# Patient Record
Sex: Female | Born: 1935 | Race: White | Hispanic: No | Marital: Married | Smoking: Never smoker
Health system: Southern US, Community
[De-identification: ages and names within clinical notes are randomized; demographics above are authoritative.]

## PROBLEM LIST (undated history)

## (undated) DIAGNOSIS — I4510 Unspecified right bundle-branch block: Secondary | ICD-10-CM

## (undated) DIAGNOSIS — F419 Anxiety disorder, unspecified: Secondary | ICD-10-CM

## (undated) DIAGNOSIS — M810 Age-related osteoporosis without current pathological fracture: Secondary | ICD-10-CM

## (undated) DIAGNOSIS — F32A Depression, unspecified: Secondary | ICD-10-CM

## (undated) DIAGNOSIS — F329 Major depressive disorder, single episode, unspecified: Secondary | ICD-10-CM

## (undated) DIAGNOSIS — I5032 Chronic diastolic (congestive) heart failure: Secondary | ICD-10-CM

## (undated) DIAGNOSIS — R51 Headache: Secondary | ICD-10-CM

## (undated) DIAGNOSIS — R519 Headache, unspecified: Secondary | ICD-10-CM

## (undated) DIAGNOSIS — I10 Essential (primary) hypertension: Secondary | ICD-10-CM

## (undated) DIAGNOSIS — G4733 Obstructive sleep apnea (adult) (pediatric): Secondary | ICD-10-CM

## (undated) DIAGNOSIS — J9610 Chronic respiratory failure, unspecified whether with hypoxia or hypercapnia: Secondary | ICD-10-CM

## (undated) DIAGNOSIS — I359 Nonrheumatic aortic valve disorder, unspecified: Secondary | ICD-10-CM

## (undated) DIAGNOSIS — J984 Other disorders of lung: Secondary | ICD-10-CM

## (undated) DIAGNOSIS — F3289 Other specified depressive episodes: Secondary | ICD-10-CM

## (undated) DIAGNOSIS — K219 Gastro-esophageal reflux disease without esophagitis: Secondary | ICD-10-CM

## (undated) DIAGNOSIS — M1990 Unspecified osteoarthritis, unspecified site: Secondary | ICD-10-CM

## (undated) HISTORY — DX: Nonrheumatic aortic valve disorder, unspecified: I35.9

## (undated) HISTORY — DX: Other specified depressive episodes: F32.89

## (undated) HISTORY — DX: Chronic diastolic (congestive) heart failure: I50.32

## (undated) HISTORY — DX: Unspecified right bundle-branch block: I45.10

## (undated) HISTORY — DX: Essential (primary) hypertension: I10

## (undated) HISTORY — DX: Major depressive disorder, single episode, unspecified: F32.9

## (undated) HISTORY — DX: Obstructive sleep apnea (adult) (pediatric): G47.33

## (undated) HISTORY — DX: Age-related osteoporosis without current pathological fracture: M81.0

---
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
## 2000-04-17 ENCOUNTER — Encounter: Payer: Self-pay | Admitting: Family Medicine

## 2000-04-17 ENCOUNTER — Encounter: Admission: RE | Admit: 2000-04-17 | Discharge: 2000-04-17 | Payer: Self-pay | Admitting: Family Medicine

## 2000-04-23 ENCOUNTER — Encounter: Admission: RE | Admit: 2000-04-23 | Discharge: 2000-04-23 | Payer: Self-pay | Admitting: Family Medicine

## 2000-04-23 ENCOUNTER — Encounter: Payer: Self-pay | Admitting: Family Medicine

## 2000-07-02 ENCOUNTER — Ambulatory Visit (HOSPITAL_COMMUNITY): Admission: RE | Admit: 2000-07-02 | Discharge: 2000-07-02 | Payer: Self-pay | Admitting: *Deleted

## 2001-07-27 ENCOUNTER — Other Ambulatory Visit: Admission: RE | Admit: 2001-07-27 | Discharge: 2001-07-27 | Payer: Self-pay | Admitting: Family Medicine

## 2001-08-24 ENCOUNTER — Encounter: Admission: RE | Admit: 2001-08-24 | Discharge: 2001-08-24 | Payer: Self-pay | Admitting: Family Medicine

## 2001-08-24 ENCOUNTER — Encounter: Payer: Self-pay | Admitting: Family Medicine

## 2002-01-12 ENCOUNTER — Encounter: Payer: Self-pay | Admitting: Emergency Medicine

## 2002-01-12 ENCOUNTER — Encounter: Payer: Self-pay | Admitting: Orthopedic Surgery

## 2002-01-12 ENCOUNTER — Emergency Department (HOSPITAL_COMMUNITY): Admission: EM | Admit: 2002-01-12 | Discharge: 2002-01-12 | Payer: Self-pay | Admitting: Emergency Medicine

## 2002-01-18 ENCOUNTER — Emergency Department (HOSPITAL_COMMUNITY): Admission: EM | Admit: 2002-01-18 | Discharge: 2002-01-18 | Payer: Self-pay

## 2002-04-01 ENCOUNTER — Emergency Department (HOSPITAL_COMMUNITY): Admission: EM | Admit: 2002-04-01 | Discharge: 2002-04-01 | Payer: Self-pay | Admitting: Emergency Medicine

## 2002-04-01 ENCOUNTER — Encounter: Payer: Self-pay | Admitting: Emergency Medicine

## 2008-06-14 ENCOUNTER — Inpatient Hospital Stay (HOSPITAL_COMMUNITY): Admission: EM | Admit: 2008-06-14 | Discharge: 2008-06-17 | Payer: Self-pay | Admitting: Emergency Medicine

## 2008-06-15 ENCOUNTER — Encounter (INDEPENDENT_AMBULATORY_CARE_PROVIDER_SITE_OTHER): Payer: Self-pay | Admitting: Internal Medicine

## 2008-06-29 ENCOUNTER — Ambulatory Visit: Payer: Self-pay | Admitting: Cardiology

## 2008-06-29 LAB — CONVERTED CEMR LAB
CO2: 31 meq/L (ref 19–32)
Calcium: 9.4 mg/dL (ref 8.4–10.5)
Creatinine, Ser: 1 mg/dL (ref 0.4–1.2)
GFR calc Af Amer: 70 mL/min
Glucose, Bld: 109 mg/dL — ABNORMAL HIGH (ref 70–99)
Sodium: 135 meq/L (ref 135–145)

## 2008-07-11 ENCOUNTER — Ambulatory Visit: Payer: Self-pay | Admitting: Cardiology

## 2008-07-11 LAB — CONVERTED CEMR LAB
BUN: 34 mg/dL — ABNORMAL HIGH (ref 6–23)
Calcium: 9.4 mg/dL (ref 8.4–10.5)
GFR calc Af Amer: 48 mL/min
GFR calc non Af Amer: 39 mL/min
Glucose, Bld: 115 mg/dL — ABNORMAL HIGH (ref 70–99)
Sodium: 140 meq/L (ref 135–145)

## 2008-07-28 ENCOUNTER — Ambulatory Visit: Payer: Self-pay | Admitting: Cardiology

## 2008-07-28 LAB — CONVERTED CEMR LAB
BUN: 18 mg/dL (ref 6–23)
Creatinine, Ser: 1.3 mg/dL — ABNORMAL HIGH (ref 0.4–1.2)
GFR calc Af Amer: 52 mL/min
GFR calc non Af Amer: 43 mL/min

## 2008-08-15 DIAGNOSIS — I10 Essential (primary) hypertension: Secondary | ICD-10-CM | POA: Insufficient documentation

## 2008-08-16 ENCOUNTER — Ambulatory Visit: Payer: Self-pay | Admitting: Pulmonary Disease

## 2008-08-16 DIAGNOSIS — G4733 Obstructive sleep apnea (adult) (pediatric): Secondary | ICD-10-CM

## 2008-08-16 DIAGNOSIS — R011 Cardiac murmur, unspecified: Secondary | ICD-10-CM | POA: Insufficient documentation

## 2008-08-16 DIAGNOSIS — F329 Major depressive disorder, single episode, unspecified: Secondary | ICD-10-CM

## 2008-08-21 ENCOUNTER — Telehealth: Payer: Self-pay | Admitting: Pulmonary Disease

## 2008-08-24 ENCOUNTER — Encounter: Payer: Self-pay | Admitting: Pulmonary Disease

## 2008-09-01 ENCOUNTER — Telehealth (INDEPENDENT_AMBULATORY_CARE_PROVIDER_SITE_OTHER): Payer: Self-pay | Admitting: *Deleted

## 2008-09-13 ENCOUNTER — Ambulatory Visit: Payer: Self-pay | Admitting: Cardiology

## 2009-04-21 DIAGNOSIS — M810 Age-related osteoporosis without current pathological fracture: Secondary | ICD-10-CM | POA: Insufficient documentation

## 2009-04-21 DIAGNOSIS — I5032 Chronic diastolic (congestive) heart failure: Secondary | ICD-10-CM | POA: Insufficient documentation

## 2009-04-21 DIAGNOSIS — I359 Nonrheumatic aortic valve disorder, unspecified: Secondary | ICD-10-CM | POA: Insufficient documentation

## 2009-05-02 ENCOUNTER — Ambulatory Visit: Payer: Self-pay | Admitting: Cardiology

## 2009-05-03 ENCOUNTER — Telehealth: Payer: Self-pay | Admitting: Cardiology

## 2009-05-21 ENCOUNTER — Telehealth: Payer: Self-pay | Admitting: Cardiology

## 2009-06-20 ENCOUNTER — Telehealth: Payer: Self-pay | Admitting: Cardiology

## 2009-08-07 ENCOUNTER — Encounter: Payer: Self-pay | Admitting: Cardiology

## 2009-08-15 ENCOUNTER — Encounter (INDEPENDENT_AMBULATORY_CARE_PROVIDER_SITE_OTHER): Payer: Self-pay | Admitting: *Deleted

## 2009-11-12 ENCOUNTER — Ambulatory Visit: Payer: Self-pay | Admitting: Cardiology

## 2010-01-01 ENCOUNTER — Ambulatory Visit: Payer: Self-pay | Admitting: Cardiology

## 2010-01-01 LAB — CONVERTED CEMR LAB
GFR calc non Af Amer: 74.75 mL/min (ref >60)
Potassium: 4.4 meq/L (ref 3.5–5.1)
Sodium: 141 meq/L (ref 135–145)

## 2010-02-12 ENCOUNTER — Ambulatory Visit: Payer: Self-pay | Admitting: Cardiology

## 2010-04-26 ENCOUNTER — Ambulatory Visit: Payer: Self-pay | Admitting: Cardiology

## 2010-04-26 DIAGNOSIS — I4510 Unspecified right bundle-branch block: Secondary | ICD-10-CM

## 2010-08-02 ENCOUNTER — Telehealth: Payer: Self-pay | Admitting: Cardiology

## 2010-08-09 ENCOUNTER — Encounter: Payer: Self-pay | Admitting: Cardiology

## 2010-11-14 ENCOUNTER — Ambulatory Visit: Payer: Self-pay | Admitting: Cardiology

## 2010-12-15 ENCOUNTER — Encounter: Payer: Self-pay | Admitting: Cardiology

## 2010-12-22 LAB — CONVERTED CEMR LAB
Calcium: 9.2 mg/dL (ref 8.4–10.5)
Creatinine, Ser: 0.8 mg/dL (ref 0.4–1.2)
GFR calc non Af Amer: 74.89 mL/min (ref >60)

## 2010-12-26 NOTE — Progress Notes (Signed)
Summary: [facility] request call  Phone Note Other Incoming Call back at [phone] ext [phone]   Caller: [facility]/ [facility] Summary of Call: [facility] request call Initial call taken by: Judie Grieve,  August 02, 2010 4:59 PM  Follow-up for Phone Call        [facility] with [facility] wanted to know had Dr. Daleen Squibb seen patient recently.  He had on 6/11.   Mylo Red RN

## 2010-12-26 NOTE — Assessment & Plan Note (Signed)
Summary: 6-8 week f/u sl   Visit Type:  6-8 wk f/u Referring Provider:  Valera Castle Primary Provider:  Dr. Wynelle Link   History of Present Illness: Lori [name] returns today for evaluation management of her chronic diastolic heart failure, hypertension, history of hypertension, and lower extremity edema.  She's been having some chest tightness at rest it does not radiate. There is no nausea vomiting or diaphoresis. She has not had it with exertion.  She denies orthopnea, PND or increasing peripheral edema.  She brings in a list of blood pressure readings and heart rate readings by [facility]. Her blood pressures are well controlled most that time with rare exception. Her diastolics were always good. Heart rates running in the 85-90 range. Her weight has been stable. She fell again 0.8 pounds in the last month and a half. Our scales confirmed.  She's been under increased stress the last several months. Her husband had complications with bypass surgery in his back in the hospital. She will also sleeping pill without deferred this to her primary care physician.  Current Medications (verified): 1)  Oxygen .Marland Kitchen.. 3 L Daily 2)  Bayer Aspirin 325 Mg Tabs (Aspirin) .... Take 1 Tablet By Mouth Once A Day 3)  Amlodipine Besylate 10 Mg Tabs (Amlodipine Besylate) .Marland Kitchen.. 1 Tab Once Daily 4)  Nortriptyline Hcl 50 Mg Caps (Nortriptyline Hcl) .... Once Daily 5)  Vitamin C 1000 Mg Tabs (Ascorbic Acid) .... Take 1 Tablet By Mouth Once A Day 6)  Lorazepam 1 Mg Tabs (Lorazepam) .... Take 1 Tablet By Mouth Three Times A Day As Needed 7)  Klor-Con M20 20 Meq Cr-Tabs (Potassium Chloride Crys Cr) .... Two Times A Day 8)  Metoprolol Tartrate 100 Mg Tabs (Metoprolol Tartrate) .Marland Kitchen.. 1 Tab Two Times A Day 9)  Lisinopril 20 Mg Tabs (Lisinopril) .... 2 Tabs Qam 10)  Furosemide 40 Mg Tabs (Furosemide) .Marland Kitchen.. 1 Once Daily 11)  Hydrocodone-Acetaminophen 5-325 Mg Tabs (Hydrocodone-Acetaminophen) .... As Needed 12)  Excedrin  Extra Strength 250-250-65 Mg Tabs (Aspirin-Acetaminophen-Caffeine) .... As Needed 13)  Vitamin D3 1000 Unit Caps (Cholecalciferol) .Marland Kitchen.. 1 Cap Once Daily 14)  Potassium Chloride Crys Cr 20 Meq Cr-Tabs (Potassium Chloride Crys Cr) .Marland Kitchen.. 1 Tab Once Daily  Allergies (verified): No Known Drug Allergies  Review of Systems       negative other than history of present illness  Vital Signs:  Patient profile:   76 year old female Height:      61 inches Weight:      168 pounds BMI:     31.86 Pulse rate:   72 / minute Pulse rhythm:   regular BP sitting:   150 / 80  (left arm) Cuff size:   large  Vitals Entered By: Danielle Rankin, CMA (February 12, 2010 11:04 AM)  Physical Exam  General:  obese.  appears very tired Head:  normocephalic and atraumatic Eyes:  PERRLA/EOM intact; conjunctiva and lids normal. Neck:  Neck supple, no JVD. No masses, thyromegaly or abnormal cervical nodes. Lungs:  no rales or rhonchi Heart:  regular rate and rhythm, no gallop, soft systolic murmur at the apex, soft diastolic murmur left upper sternal border. Stable Msk:  decreased ROM.   Pulses:  pulses normal in all 4 extremities Extremities:  1+ left pedal edema and 1+ right pedal edema.   Neurologic:  Alert and oriented x 3. Skin:  Intact without lesions or rashes. Psych:  depressed affect.     Problems:  Medical Problems Added: 1)  Dx of Chest Tightness-pressure-other  [phone])  Impression & Recommendations:  Problem # 1:  DIASTOLIC HEART FAILURE, CHRONIC (ICD-428.32) Assessment Unchanged  Her updated medication list for this problem includes:    Bayer Aspirin 325 Mg Tabs (Aspirin) .Marland Kitchen... Take 1 tablet by mouth once a day    Amlodipine Besylate 10 Mg Tabs (Amlodipine besylate) .Marland Kitchen... 1 tab once daily    Metoprolol Tartrate 100 Mg Tabs (Metoprolol tartrate) .Marland Kitchen... 1 tab two times a day    Lisinopril 20 Mg Tabs (Lisinopril) .Marland Kitchen... 2 tabs qam    Furosemide 40 Mg Tabs (Furosemide) .Marland Kitchen... 1 once  daily  Problem # 2:  AORTIC INSUFFICIENCY (ICD-424.1) Assessment: Unchanged  Her updated medication list for this problem includes:    Metoprolol Tartrate 100 Mg Tabs (Metoprolol tartrate) .Marland Kitchen... 1 tab two times a day    Lisinopril 20 Mg Tabs (Lisinopril) .Marland Kitchen... 2 tabs qam    Furosemide 40 Mg Tabs (Furosemide) .Marland Kitchen... 1 once daily  Her updated medication list for this problem includes:    Metoprolol Tartrate 100 Mg Tabs (Metoprolol tartrate) .Marland Kitchen... 1 tab two times a day    Lisinopril 20 Mg Tabs (Lisinopril) .Marland Kitchen... 2 tabs qam    Furosemide 40 Mg Tabs (Furosemide) .Marland Kitchen... 1 once daily  Problem # 3:  CARDIAC MURMUR (ICD-785.2) Assessment: Unchanged  Her updated medication list for this problem includes:    Metoprolol Tartrate 100 Mg Tabs (Metoprolol tartrate) .Marland Kitchen... 1 tab two times a day    Lisinopril 20 Mg Tabs (Lisinopril) .Marland Kitchen... 2 tabs qam    Furosemide 40 Mg Tabs (Furosemide) .Marland Kitchen... 1 once daily  Her updated medication list for this problem includes:    Metoprolol Tartrate 100 Mg Tabs (Metoprolol tartrate) .Marland Kitchen... 1 tab two times a day    Lisinopril 20 Mg Tabs (Lisinopril) .Marland Kitchen... 2 tabs qam    Furosemide 40 Mg Tabs (Furosemide) .Marland Kitchen... 1 once daily  Problem # 4:  HYPERTENSION (ICD-401.9) Assessment: Improved  Her updated medication list for this problem includes:    Bayer Aspirin 325 Mg Tabs (Aspirin) .Marland Kitchen... Take 1 tablet by mouth once a day    Amlodipine Besylate 10 Mg Tabs (Amlodipine besylate) .Marland Kitchen... 1 tab once daily    Metoprolol Tartrate 100 Mg Tabs (Metoprolol tartrate) .Marland Kitchen... 1 tab two times a day    Lisinopril 20 Mg Tabs (Lisinopril) .Marland Kitchen... 2 tabs qam    Furosemide 40 Mg Tabs (Furosemide) .Marland Kitchen... 1 once daily  Her updated medication list for this problem includes:    Bayer Aspirin 325 Mg Tabs (Aspirin) .Marland Kitchen... Take 1 tablet by mouth once a day    Amlodipine Besylate 10 Mg Tabs (Amlodipine besylate) .Marland Kitchen... 1 tab once daily    Metoprolol Tartrate 100 Mg Tabs (Metoprolol tartrate) .Marland Kitchen... 1 tab  two times a day    Lisinopril 20 Mg Tabs (Lisinopril) .Marland Kitchen... 2 tabs qam    Furosemide 40 Mg Tabs (Furosemide) .Marland Kitchen... 1 once daily  Problem # 5:  CHEST TIGHTNESS-PRESSURE-OTHER ([id number]) Assessment: New She is having some chest tightness though is not classic for angina or ischemia. She had normal coronary arteries by catheterization in 2001. I've given her submental nitroglycerin to take p.r.n. and instructions for how to call 911 if she thinks she is having an acute coronary syndrome. We'll not perform any diagnostic studies at this time. I will see her back in 3 months.  Patient Instructions: 1)  Your physician recommends that you schedule a follow-up appointment in: 3 MONTHS WITH DR Gabriella Woodhead 2)  Your  physician recommends that you continue on your current medications as directed. Please refer to the Current Medication list given to you today. 3)  Your physician recommended you take 1 tablet (or 1 spray) under tongue at onset of chest pain; you may repeat every 5 minutes for up to 3 doses. If 3 or more doses are required, call 911 and proceed to the ER immediately.

## 2010-12-26 NOTE — Assessment & Plan Note (Signed)
Summary: per check out/sf   Visit Type:  rov Referring Provider:  Valera Castle Primary Provider:  Dr. Wynelle Link  CC:  chest discomfort and sob over the last couple of days but says due to she has been moving some things....edema/ankle..mostly left.  History of Present Illness: Lori [name] comes in today for evaluation management of her history of chronic diastolic congestive heart failure, hypertension, and lower extremity edema.  Overall, she is doing well. She thinks her lower extremity swelling may be a little worse particularly in the left leg. They're markedly improved from our initial evaluation.  She denies any orthopnea. She wears oxygen at night but her husband says she's not wearing it all the time or on a consistent basis. She denies any sleep apnea. She's had a formal evaluation in the past.  She denies any chest pain. She does note some heavy heartbeats on occasion but no syncope.  Current Medications (verified): 1)  Oxygen .Marland Kitchen.. 3 L Daily 2)  Bayer Aspirin 325 Mg Tabs (Aspirin) .... Take 1 Tablet By Mouth Once A Day 3)  Amlodipine Besylate 10 Mg Tabs (Amlodipine Besylate) .Marland Kitchen.. 1 Tab Once Daily 4)  Nortriptyline Hcl 50 Mg Caps (Nortriptyline Hcl) .... Once Daily 5)  Vitamin C 1000 Mg Tabs (Ascorbic Acid) .... Take 1 Tablet By Mouth Once A Day 6)  Lorazepam 1 Mg Tabs (Lorazepam) .... Take 1 Tablet By Mouth Three Times A Day As Needed 7)  Klor-Con M20 20 Meq Cr-Tabs (Potassium Chloride Crys Cr) .... Two Times A Day 8)  Metoprolol Tartrate 100 Mg Tabs (Metoprolol Tartrate) .Marland Kitchen.. 1 Tab Two Times A Day 9)  Lisinopril 20 Mg Tabs (Lisinopril) .... 2 Tabs Qam 10)  Furosemide 40 Mg Tabs (Furosemide) .Marland Kitchen.. 1 Once Daily 11)  Hydrocodone-Acetaminophen 5-325 Mg Tabs (Hydrocodone-Acetaminophen) .... As Needed 12)  Excedrin Extra Strength 250-250-65 Mg Tabs (Aspirin-Acetaminophen-Caffeine) .... As Needed 13)  Vitamin D3 1000 Unit Caps (Cholecalciferol) .Marland Kitchen.. 1 Cap Once Daily 14)  Potassium  Chloride Crys Cr 20 Meq Cr-Tabs (Potassium Chloride Crys Cr) .Marland Kitchen.. 1 Tab Two Times A Day 15)  Nitrostat 0.4 Mg Subl (Nitroglycerin) .... As Needed For Chest Pain 16)  Nucynta 75 Mg Tabs (Tapentadol Hcl) .Marland Kitchen.. 1 Tab Two Times A Day  Allergies (verified): No Known Drug Allergies  Past History:  Past Medical History: Last updated: 04/21/2009 AORTIC INSUFFICIENCY (ICD-424.1) DIASTOLIC HEART FAILURE, CHRONIC (ICD-428.32) CORONARY HEART DISEASE (ICD-414.00) CARDIAC MURMUR (ICD-785.2) HYPERTENSION (ICD-401.9) OBSTRUCTIVE SLEEP APNEA (ICD-327.23) DEPRESSION (ICD-311) OSTEOPOROSIS (ICD-733.00)    Past Surgical History: Last updated: 04/21/2009 Rhinoplasty  Family History: Last updated: 04/21/2009 clotting disorders: sisters heart disease: sister (m.i.) 2 brothers (m.i.) children (heart murmurs) cancer: father (lung) mother: TB  Social History: Last updated: 08/16/2008 pt is married. pt has children. Patient never smoked.   Risk Factors: Smoking Status: never (08/16/2008)  Review of Systems       negative history of present illness  Vital Signs:  Patient profile:   76 year old female Height:      61 inches Weight:      168 pounds BMI:     31.86 O2 Sat:      96 % on Room air Pulse rate:   92 / minute Pulse rhythm:   irregular BP sitting:   110 / 60  (left arm) Cuff size:   large  Vitals Entered By: Danielle Rankin, CMA (April 26, 2010 11:20 AM)  O2 Flow:  Room air  Physical Exam  General:  obese.  Head:  normocephalic and atraumatic Eyes:  PERRLA/EOM intact; conjunctiva and lids normal. Neck:  Neck supple, no JVD. No masses, thyromegaly or abnormal cervical nodes. Lungs:  Clear bilaterally to auscultation and percussion. Heart:  regular rate and rhythm, normal S1-S2, mild diastolic murmur along left sternal border, no gallop Msk:  decreased ROM.   Pulses:  pulses normal in all 4 extremities Extremities:  1+ left pedal edema and trace right pedal edema.     Neurologic:  Alert and oriented x 3. Skin:  Intact without lesions or rashes. Psych:  Normal affect.   Problems:  Medical Problems Added: 1)  Dx of Rbbb  (ICD-426.4)  EKG  Procedure date:  04/26/2010  Findings:      nsinus rhythm, left atrial enlargement, right bundle branch block  Impression & Recommendations:  Problem # 1:  DIASTOLIC HEART FAILURE, CHRONIC (ICD-428.32) Assessment Unchanged  Her updated medication list for this problem includes:    Bayer Aspirin 325 Mg Tabs (Aspirin) .Marland Kitchen... Take 1 tablet by mouth once a day    Amlodipine Besylate 10 Mg Tabs (Amlodipine besylate) .Marland Kitchen... 1 tab once daily    Metoprolol Tartrate 100 Mg Tabs (Metoprolol tartrate) .Marland Kitchen... 1 tab two times a day    Lisinopril 20 Mg Tabs (Lisinopril) .Marland Kitchen... 2 tabs qam    Furosemide 40 Mg Tabs (Furosemide) .Marland Kitchen... 1 once daily    Nitrostat 0.4 Mg Subl (Nitroglycerin) .Marland Kitchen... As needed for chest pain  Problem # 2:  AORTIC INSUFFICIENCY (ICD-424.1) Assessment: Unchanged  Her updated medication list for this problem includes:    Metoprolol Tartrate 100 Mg Tabs (Metoprolol tartrate) .Marland Kitchen... 1 tab two times a day    Lisinopril 20 Mg Tabs (Lisinopril) .Marland Kitchen... 2 tabs qam    Furosemide 40 Mg Tabs (Furosemide) .Marland Kitchen... 1 once daily    Nitrostat 0.4 Mg Subl (Nitroglycerin) .Marland Kitchen... As needed for chest pain  Problem # 3:  HYPERTENSION (ICD-401.9) Assessment: Improved  Her updated medication list for this problem includes:    Bayer Aspirin 325 Mg Tabs (Aspirin) .Marland Kitchen... Take 1 tablet by mouth once a day    Amlodipine Besylate 10 Mg Tabs (Amlodipine besylate) .Marland Kitchen... 1 tab once daily    Metoprolol Tartrate 100 Mg Tabs (Metoprolol tartrate) .Marland Kitchen... 1 tab two times a day    Lisinopril 20 Mg Tabs (Lisinopril) .Marland Kitchen... 2 tabs qam    Furosemide 40 Mg Tabs (Furosemide) .Marland Kitchen... 1 once daily  Orders: EKG w/ Interpretation (93000)  Problem # 4:  OBSTRUCTIVE SLEEP APNEA (ICD-327.23) Assessment: Unchanged She has been told she does not need  CPAP.  Problem # 5:  RBBB (ICD-426.4) Assessment: Unchanged  Her updated medication list for this problem includes:    Bayer Aspirin 325 Mg Tabs (Aspirin) .Marland Kitchen... Take 1 tablet by mouth once a day    Amlodipine Besylate 10 Mg Tabs (Amlodipine besylate) .Marland Kitchen... 1 tab once daily    Metoprolol Tartrate 100 Mg Tabs (Metoprolol tartrate) .Marland Kitchen... 1 tab two times a day    Lisinopril 20 Mg Tabs (Lisinopril) .Marland Kitchen... 2 tabs qam    Nitrostat 0.4 Mg Subl (Nitroglycerin) .Marland Kitchen... As needed for chest pain  Her updated medication list for this problem includes:    Bayer Aspirin 325 Mg Tabs (Aspirin) .Marland Kitchen... Take 1 tablet by mouth once a day    Amlodipine Besylate 10 Mg Tabs (Amlodipine besylate) .Marland Kitchen... 1 tab once daily    Metoprolol Tartrate 100 Mg Tabs (Metoprolol tartrate) .Marland Kitchen... 1 tab two times a day    Lisinopril 20 Mg  Tabs (Lisinopril) .Marland Kitchen... 2 tabs qam    Nitrostat 0.4 Mg Subl (Nitroglycerin) .Marland Kitchen... As needed for chest pain  Patient Instructions: 1)  Your physician recommends that you schedule a follow-up appointment in: 6 MONTHS WITH DR Zayn Selley 2)  Your physician recommends that you continue on your current medications as directed. Please refer to the Current Medication list given to you today. Prescriptions: METOPROLOL TARTRATE 100 MG TABS (METOPROLOL TARTRATE) 1 tab two times a day  #180 x 3   Entered by:   Danielle Rankin, CMA   Authorized by:   Gaylord Shih, MD, [facility]   Signed by:   Danielle Rankin, CMA on 04/26/2010   Method used:   Faxed to ...       [facility] (mail-order)       [address]       [place], [place]  [address]       Ph: [phone]       Fax: [phone]   RxID:   [id number] LISINOPRIL 20 MG TABS (LISINOPRIL) 2 tabs qam  #180 x 3   Entered by:   Danielle Rankin, CMA   Authorized by:   Gaylord Shih, MD, [facility]   Signed by:   Danielle Rankin, CMA on 04/26/2010   Method used:   Faxed to ...       [address] [facility] (mail-order)       [address]       [place], [place]  [address]       Ph: [phone]       Fax:  [phone]   RxID:   [id number] POTASSIUM CHLORIDE CRYS CR 20 MEQ CR-TABS (POTASSIUM CHLORIDE CRYS CR) 1 tab two times a day  #180 x 3   Entered by:   Danielle Rankin, CMA   Authorized by:   Gaylord Shih, MD, [facility]   Signed by:   Danielle Rankin, CMA on 04/26/2010   Method used:   Faxed to ...       [address] [facility] (mail-order)       [address]       [place], [place]  [address]       Ph: [phone]       Fax: [phone]   RxID:   [id number] FUROSEMIDE 40 MG TABS (FUROSEMIDE) 1 once daily  #90 x 3   Entered by:   Danielle Rankin, CMA   Authorized by:   Gaylord Shih, MD, [facility]   Signed by:   Danielle Rankin, CMA on 04/26/2010   Method used:   Faxed to ...       [address] [facility] (mail-order)       [address]       [place], [place]  [address]       Ph: [phone]       Fax: [phone]   RxID:   [id number]

## 2010-12-26 NOTE — Assessment & Plan Note (Signed)
Summary: per check out/sf   Visit Type:  rov Referring Provider:  Valera Castle Primary Provider:  Dr. Wynelle Link  CC:  sob w/walking....denies any cp or edema.  History of Present Illness: Lori [name] returns today for history of chronic diastolic heart failure, aortic insufficiency, hypertension which is been difficult to control in the past, obstructive sleep apnea and pulmonary hypertension.  She tried of all of her medications. They have been ordered but have not come in yet. She is on all generics.  She denies orthopnea, PND or increased edema. Her blood pressure is elevated at 162 her heart rate is 100.   Current Medications (verified): 1)  Oxygen .Marland Kitchen.. 3 L Daily 2)  Bayer Aspirin 325 Mg Tabs (Aspirin) .... Take 1 Tablet By Mouth Once A Day 3)  Amlodipine Besylate 10 Mg Tabs (Amlodipine Besylate) .Marland Kitchen.. 1 Tab Once Daily 4)  Nortriptyline Hcl 50 Mg Caps (Nortriptyline Hcl) .... Once Daily 5)  Vitamin C 1000 Mg Tabs (Ascorbic Acid) .... Take 1 Tablet By Mouth Once A Day 6)  Lorazepam 1 Mg Tabs (Lorazepam) .... Take 1 Tablet By Mouth Three Times A Day As Needed 7)  Klor-Con M20 20 Meq Cr-Tabs (Potassium Chloride Crys Cr) .... Two Times A Day 8)  Metoprolol Tartrate 100 Mg Tabs (Metoprolol Tartrate) .Marland Kitchen.. 1 Tab Two Times A Day 9)  Lisinopril 20 Mg Tabs (Lisinopril) .... 2 Tabs Qam 10)  Furosemide 40 Mg Tabs (Furosemide) .Marland Kitchen.. 1 Once Daily 11)  Excedrin Extra Strength 250-250-65 Mg Tabs (Aspirin-Acetaminophen-Caffeine) .... As Needed 12)  Vitamin D 400 Unit Caps (Cholecalciferol) .Marland Kitchen.. 1 Cap Once Daily 13)  Nitrostat 0.4 Mg Subl (Nitroglycerin) .... As Needed For Chest Pain 14)  Nucynta 75 Mg Tabs (Tapentadol Hcl) .Marland Kitchen.. 1 Tab Two Times A Day 15)  Vitamin E 400 Unit Caps (Vitamin E) .Marland Kitchen.. 1 Cap Once Daily  Allergies (verified): No Known Drug Allergies  Past History:  Past Medical History: Last updated: 04/21/2009 AORTIC INSUFFICIENCY (ICD-424.1) DIASTOLIC HEART FAILURE, CHRONIC  (ICD-428.32) CORONARY HEART DISEASE (ICD-414.00) CARDIAC MURMUR (ICD-785.2) HYPERTENSION (ICD-401.9) OBSTRUCTIVE SLEEP APNEA (ICD-327.23) DEPRESSION (ICD-311) OSTEOPOROSIS (ICD-733.00)    Past Surgical History: Last updated: 04/21/2009 Rhinoplasty  Family History: Last updated: 04/21/2009 clotting disorders: sisters heart disease: sister (m.i.) 2 brothers (m.i.) children (heart murmurs) cancer: father (lung) mother: TB  Social History: Last updated: 08/16/2008 pt is married. pt has children. Patient never smoked.   Risk Factors: Smoking Status: never (08/16/2008)  Review of Systems       negative other history of present illness  Vital Signs:  Patient profile:   76 year old female Height:      61 inches Weight:      170.75 pounds BMI:     32.38 Pulse rate:   98 / minute Pulse rhythm:   irregular BP sitting:   162 / 80  (left arm) Cuff size:   large  Vitals Entered By: Danielle Rankin, CMA (November 14, 2010 11:14 AM)  Physical Exam  General:  chronically ill, in no acute distress, obese, unkept.   Head:  normocephalic and atraumatic Eyes:  PERRLA/EOM intact; conjunctiva and lids normal. Neck:  Neck supple, no JVD. No masses, thyromegaly or abnormal cervical nodes. Chest Wall:  no deformities or breast masses noted Lungs:  Clear bilaterally to auscultation and percussion. Heart:  PMI poorly appreciated, rapid rate and regular rhythm, no gallop, soft diastolic murmur left upper sternal border, no carotid bruits Msk:  decreased ROM.   Pulses:  pulses normal in  all 4 extremities Extremities:  trace left pedal edema and trace right pedal edema.   Neurologic:  Alert and oriented x 3. Skin:  Intact without lesions or rashes. Psych:  Normal affect.   Impression & Recommendations:  Problem # 1:  DIASTOLIC HEART FAILURE, CHRONIC (ICD-428.32)  Her updated medication list for this problem includes:    Bayer Aspirin 325 Mg Tabs (Aspirin) .Marland Kitchen... Take 1 tablet by mouth  once a day    Amlodipine Besylate 10 Mg Tabs (Amlodipine besylate) .Marland Kitchen... 1 tab once daily    Metoprolol Tartrate 100 Mg Tabs (Metoprolol tartrate) .Marland Kitchen... 1 tab two times a day    Lisinopril 20 Mg Tabs (Lisinopril) .Marland Kitchen... 2 tabs qam    Furosemide 40 Mg Tabs (Furosemide) .Marland Kitchen... 1 once daily    Nitrostat 0.4 Mg Subl (Nitroglycerin) .Marland Kitchen... As needed for chest pain  Problem # 2:  AORTIC INSUFFICIENCY (ICD-424.1)  Her updated medication list for this problem includes:    Metoprolol Tartrate 100 Mg Tabs (Metoprolol tartrate) .Marland Kitchen... 1 tab two times a day    Lisinopril 20 Mg Tabs (Lisinopril) .Marland Kitchen... 2 tabs qam    Furosemide 40 Mg Tabs (Furosemide) .Marland Kitchen... 1 once daily    Nitrostat 0.4 Mg Subl (Nitroglycerin) .Marland Kitchen... As needed for chest pain  Problem # 3:  HYPERTENSION (ICD-401.9)  Her updated medication list for this problem includes:    Bayer Aspirin 325 Mg Tabs (Aspirin) .Marland Kitchen... Take 1 tablet by mouth once a day    Amlodipine Besylate 10 Mg Tabs (Amlodipine besylate) .Marland Kitchen... 1 tab once daily    Metoprolol Tartrate 100 Mg Tabs (Metoprolol tartrate) .Marland Kitchen... 1 tab two times a day    Lisinopril 20 Mg Tabs (Lisinopril) .Marland Kitchen... 2 tabs qam    Furosemide 40 Mg Tabs (Furosemide) .Marland Kitchen... 1 once daily  Patient Instructions: 1)  Your physician recommends that you schedule a follow-up appointment in: 6 months with Dr. Daleen Squibb 2)  Your physician recommends that you continue on your current medications as directed. Please refer to the Current Medication list given to you today. Prescriptions: FUROSEMIDE 40 MG TABS (FUROSEMIDE) 1 once daily  #30 x 2   Entered by:   Danielle Rankin, CMA   Authorized by:   Gaylord Shih, MD, [facility]   Signed by:   Danielle Rankin, CMA on 11/14/2010   Method used:   Electronically to        [facility] [address] (retail)       [phone]       [place], [place]  [address]       Ph: [phone]       Fax: [phone]   RxID:   [id number] LISINOPRIL 20 MG TABS  (LISINOPRIL) 2 tabs qam  #60 x 2   Entered by:   Danielle Rankin, CMA   Authorized by:   Gaylord Shih, MD, [facility]   Signed by:   Danielle Rankin, CMA on 11/14/2010   Method used:   Electronically to        [facility] [address] (retail)       [phone]       [place], [place]  [address]       Ph: [phone]       Fax: [phone]   RxID:   [id number] KLOR-CON M20 20 MEQ CR-TABS (POTASSIUM CHLORIDE CRYS CR) two times a day  #60 x 2  Entered by:   Danielle Rankin, CMA   Authorized by:   Gaylord Shih, MD, [facility]   Signed by:   Danielle Rankin, CMA on 11/14/2010   Method used:   Electronically to        [facility] [address] [place] (retail)       [phone]       [place], [place]  [address]       Ph: [phone]       Fax: [phone]   RxID:   [id number] METOPROLOL TARTRATE 100 MG TABS (METOPROLOL TARTRATE) 1 tab two times a day  #60 x 2   Entered by:   Danielle Rankin, CMA   Authorized by:   Gaylord Shih, MD, [facility]   Signed by:   Danielle Rankin, CMA on 11/14/2010   Method used:   Electronically to        [facility] [address] (retail)       [phone]       [place], [place]  [address]       Ph: [phone]       Fax: [phone]   RxID:   [id number] AMLODIPINE BESYLATE 10 MG TABS (AMLODIPINE BESYLATE) 1 tab once daily  #30 x 2   Entered by:   Danielle Rankin, CMA   Authorized by:   Gaylord Shih, MD, [facility]   Signed by:   Danielle Rankin, CMA on 11/14/2010   Method used:   Electronically to        [facility] [address] (retail)       [phone]       [place], [place]  [address]       Ph: [phone]       Fax: [phone]   RxID:   [id number]   Appended Document: order 90 day drugs  ONLY MED THAT NEEDED REFILL IS AMLODIPINE. GAVE VERBAL ORDER TO [facility] ATT. Claris Gladden, RN  Clinical Lists Changes  Medications: Rx of  AMLODIPINE BESYLATE 10 MG TABS (AMLODIPINE BESYLATE) 1 tab once daily;  #90 x 3;  Signed;  Entered by: Claris Gladden RN;  Authorized by: Gaylord Shih, MD, [facility];  Method used: Telephoned to [facility], , , [place]    [place], Ph: [phone], Fax:  Rx of KLOR-CON M20 20 MEQ CR-TABS (POTASSIUM CHLORIDE CRYS CR) two times a day;  #180 x 3;  Signed;  Entered by: Claris Gladden RN;  Authorized by: Gaylord Shih, MD, [facility];  Method used: Telephoned to [facility], , , [place]    [place], Ph: [phone], Fax:  Rx of METOPROLOL TARTRATE 100 MG TABS (METOPROLOL TARTRATE) 1 tab two times a day;  #180 x 3;  Signed;  Entered by: Claris Gladden RN;  Authorized by: Gaylord Shih, MD, [facility];  Method used: Telephoned to [facility], , , [place]    [place], Ph: [phone], Fax:  Rx of LISINOPRIL 20 MG TABS (LISINOPRIL) 2 tabs qam;  #180 x 3;  Signed;  Entered by: Claris Gladden RN;  Authorized by: Gaylord Shih, MD, [facility];  Method used: Telephoned to [facility], , , [place]    [place], Ph: [phone], Fax:  Rx of FUROSEMIDE 40 MG TABS (FUROSEMIDE) 1 once daily;  #90 x 3;  Signed;  Entered by: Claris Gladden RN;  Authorized by: Gaylord Shih, MD, [facility];  Method used: Telephoned  to [facility], , , [place]    [place], Ph: [phone], Fax:    Prescriptions: FUROSEMIDE 40 MG TABS (FUROSEMIDE) 1 once daily  #90 x 3   Entered by:   Claris Gladden RN   Authorized by:   Gaylord Shih, MD, [facility]   Signed by:   Claris Gladden RN on 11/21/2010   Method used:   Telephoned to ...       [facility] (retail)             , [place]    [place]       Ph: [phone]       Fax:    RxID:   [id number] LISINOPRIL 20 MG TABS (LISINOPRIL) 2 tabs qam  #180 x 3   Entered by:   Claris Gladden RN   Authorized by:   Gaylord Shih, MD, [facility]   Signed by:   Claris Gladden RN on 11/21/2010   Method used:   Telephoned to ...       [facility] (retail)             , [place]    [place]       Ph: [phone]       Fax:    RxID:   [id number] METOPROLOL TARTRATE 100 MG  TABS (METOPROLOL TARTRATE) 1 tab two times a day  #180 x 3   Entered by:   Claris Gladden RN   Authorized by:   Gaylord Shih, MD, [facility]   Signed by:   Claris Gladden RN on 11/21/2010   Method used:   Telephoned to ...       [facility] (retail)             , [place]    [place]       Ph: [phone]       Fax:    RxID:   [id number] KLOR-CON M20 20 MEQ CR-TABS (POTASSIUM CHLORIDE CRYS CR) two times a day  #180 x 3   Entered by:   Claris Gladden RN   Authorized by:   Gaylord Shih, MD, [facility]   Signed by:   Claris Gladden RN on 11/21/2010   Method used:   Telephoned to ...       [facility] (retail)             , [place]    [place]       Ph: [phone]       Fax:    RxID:   [id number] AMLODIPINE BESYLATE 10 MG TABS (AMLODIPINE BESYLATE) 1 tab once daily  #90 x 3   Entered by:   Claris Gladden RN   Authorized by:   Gaylord Shih, MD, [facility]   Signed by:   Claris Gladden RN on 11/21/2010   Method used:   Telephoned to ...       [facility] (retail)             , [place]    [place]       Ph: [phone]       Fax:    RxID:   [id number]

## 2010-12-26 NOTE — Letter (Signed)
Summary: Dept of Health & Human Services   [facility] Health & Human Services   Imported By: Marylou Mccoy 08/23/2010 10:51:12  _____________________________________________________________________  External Attachment:    Type:   Image     Comment:   External Document

## 2010-12-26 NOTE — Assessment & Plan Note (Signed)
Summary: per check out/sf   Visit Type:  6 wk f/u Referring Provider:  Valera Castle Primary Provider:  Dr. Wynelle Link  CC:  edema/feet/legs...denies any sob or cp.  History of Present Illness: Lori [name] returns today for close follow up of her hypertension. We see my previous note a few weeks ago.  We increased her lisinopril to 40 mg a day. Unfortunately, she's taking 20 g twice a day. She did take her medications this morning.  She is under a lot of stress with her husband is having surgery.  Current Medications (verified): 1)  Oxygen .Marland Kitchen.. 3 L Daily 2)  Bayer Aspirin 325 Mg Tabs (Aspirin) .... Take 1 Tablet By Mouth Once A Day 3)  Amlodipine Besylate 5 Mg Tabs (Amlodipine Besylate) .Marland Kitchen.. 1 Tab Once Daily 4)  Nortriptyline Hcl 50 Mg Caps (Nortriptyline Hcl) .... Once Daily 5)  Vitamin C 1000 Mg Tabs (Ascorbic Acid) .... Take 1 Tablet By Mouth Once A Day 6)  Lorazepam 1 Mg Tabs (Lorazepam) .... Take 1 Tablet By Mouth Three Times A Day As Needed 7)  Klor-Con M20 20 Meq Cr-Tabs (Potassium Chloride Crys Cr) .... Two Times A Day 8)  Metoprolol Tartrate 50 Mg Tabs (Metoprolol Tartrate) .... Take 1 1/2 Tabs By Mouth Two Times A Day 9)  Lisinopril 20 Mg Tabs (Lisinopril) .Marland Kitchen.. 1 Tab Two Times A Day 10)  Furosemide 40 Mg Tabs (Furosemide) .Marland Kitchen.. 1 Once Daily 11)  Hydrocodone-Acetaminophen 5-325 Mg Tabs (Hydrocodone-Acetaminophen) .... As Needed 12)  Excedrin Extra Strength 250-250-65 Mg Tabs (Aspirin-Acetaminophen-Caffeine) .... As Needed 13)  Vitamin D3 1000 Unit Caps (Cholecalciferol) .Marland Kitchen.. 1 Cap Once Daily  Allergies (verified): No Known Drug Allergies  Past History:  Past Medical History: Last updated: 04/21/2009 AORTIC INSUFFICIENCY (ICD-424.1) DIASTOLIC HEART FAILURE, CHRONIC (ICD-428.32) CORONARY HEART DISEASE (ICD-414.00) CARDIAC MURMUR (ICD-785.2) HYPERTENSION (ICD-401.9) OBSTRUCTIVE SLEEP APNEA (ICD-327.23) DEPRESSION (ICD-311) OSTEOPOROSIS (ICD-733.00)    Past Surgical  History: Last updated: 04/21/2009 Rhinoplasty  Family History: Last updated: 04/21/2009 clotting disorders: sisters heart disease: sister (m.i.) 2 brothers (m.i.) children (heart murmurs) cancer: father (lung) mother: TB  Social History: Last updated: 08/16/2008 pt is married. pt has children. Patient never smoked.   Risk Factors: Smoking Status: never (08/16/2008)  Review of Systems       negative other than history of present illness  Vital Signs:  Patient profile:   76 year old female Height:      61 inches Weight:      167 pounds BMI:     31.67 Pulse rate:   78 / minute Pulse rhythm:   regular BP sitting:   168 / 90  (left arm) Cuff size:   large  Vitals Entered By: Danielle Rankin, CMA (January 01, 2010 11:13 AM)  Physical Exam  General:  Well developed, well nourished, in no acute distress. Head:  normocephalic and atraumatic Eyes:  PERRLA/EOM intact; conjunctiva and lids normal. Neck:  Neck supple, no JVD. No masses, thyromegaly or abnormal cervical nodes. Chest Lori [name]:  no deformities or breast masses noted Lungs:  Clear bilaterally to auscultation and percussion. Heart:  Non-displaced PMI, chest non-tender; regular rate and rhythm, S1, S2 without murmurs, rubs or gallops. Carotid upstroke normal, no bruit. Normal abdominal aortic size, no bruits. Femorals normal pulses, no bruits. Pedals normal pulses. No edema, no varicosities. Msk:  decreased ROM.  decreased ROM.   Pulses:  pulses normal in all 4 extremities Extremities:  No clubbing or cyanosis. Neurologic:  Alert and oriented x 3. Skin:  Intact without lesions or rashes. Psych:  Normal affect.   Impression & Recommendations:  Problem # 1:  HYPERTENSION (ICD-401.9) Assessment Deteriorated Her blood pressure still poorly controlled. Following his her beta blocker to 200 mg twice a day with a heart rate in the 90s, increase amlodipine to 10 mg per day, and change her lisinopril to 40 mg each morning.  We'll also check electrolytes today. We've asked her monitor her blood pressure time. We'll see her back again in about 6-8 weeks. Her updated medication list for this problem includes:    Bayer Aspirin 325 Mg Tabs (Aspirin) .Marland Kitchen... Take 1 tablet by mouth once a day    Amlodipine Besylate 5 Mg Tabs (Amlodipine besylate) .Marland Kitchen... 1 tab once daily    Metoprolol Tartrate 50 Mg Tabs (Metoprolol tartrate) .Marland Kitchen... Take 1 1/2 tabs by mouth two times a day    Lisinopril 20 Mg Tabs (Lisinopril) .Marland Kitchen... 1 tab two times a day    Furosemide 40 Mg Tabs (Furosemide) .Marland Kitchen... 1 once daily  Orders: TLB-BMP (Basic Metabolic Panel-BMET) (80048-METABOL)  Patient Instructions: 1)  Your physician recommends that you schedule a follow-up appointment in: 6-8 WEEKS WITH DR Jules Baty 2)  Your physician recommends that you return for lab work ZO:[id number] BMET 3)  Your physician has recommended you make the following change in your medication: TAKE LISINOPRIL 20 MG 2 TABS EVERY AM 4)  INCREASE AMLODIPINE TO 10 MG EVERY DAY 5)  METOPROLOL 50 MG 2 TABS TWICE DAILY 6)  CHECK B/P AT HOME KEEP LOG Prescriptions: FUROSEMIDE 40 MG TABS (FUROSEMIDE) 1 once daily  #90 x 3   Entered by:   Danielle Rankin, CMA   Authorized by:   Gaylord Shih, MD, [facility]   Signed by:   Danielle Rankin, CMA on 01/01/2010   Method used:   Faxed to ...       [facility] (mail-order)       [address]       [place], [place]  [address]       Ph: [phone]       Fax: [phone]   RxID:   [id number] AMLODIPINE BESYLATE 5 MG TABS (AMLODIPINE BESYLATE) 1 tab once daily  #90 x 3   Entered by:   Danielle Rankin, CMA   Authorized by:   Gaylord Shih, MD, [facility]   Signed by:   Danielle Rankin, CMA on 01/01/2010   Method used:   Electronically to        [facility] [address] (retail)       [phone]       [place], [place]  [address]       Ph: [phone]       Fax: [phone]   RxID:   [id number] FUROSEMIDE 40 MG TABS (FUROSEMIDE) 1  once daily  #90 x 3   Entered by:   Danielle Rankin, CMA   Authorized by:   Gaylord Shih, MD, [facility]   Signed by:   Danielle Rankin, CMA on 01/01/2010   Method used:   Electronically to        [facility] [address] (retail)       [phone]       [place], [place]  [address]       Ph: [phone]       Fax: [phone]   RxID:   [id number]

## 2011-04-08 NOTE — H&P (Signed)
NAME:  Lori, [name] NO.:  [id number]   MEDICAL RECORD NO.:  [id number]          PATIENT TYPE:  EMS   LOCATION:  ED                           FACILITY:  [facility]   PHYSICIAN:  Ramiro Harvest, MD    DATE OF BIRTH:  11-12-1935   DATE OF ADMISSION:  06/14/2008  DATE OF DISCHARGE:                              HISTORY & PHYSICAL   PRIMARY CARE PHYSICIAN:  Dr. Wynelle Link of [facility].   CARDIOLOGIST:  Dr. Mayford Knife of [facility].   HISTORY OF PRESENT ILLNESS:  Lori [name] is a 76 year old white female  with history of hypertension, depression, osteoarthritis.  No  significant coronary artery disease.  Positive family history of  coronary artery disease who presents to the ED from PCP's office with a  2-week history of worsening shortness of breath, bilateral lower  extremity edema, orthopnea, paroxysmal nocturnal dyspnea and  nonproductive cough, some emesis which occurred once 2 weeks ago.  Also,  with a headache and decrease p.o. intake, some chills, some fatigue and  constipation.  The patient denies any chest pain, no fever, no nausea,  no diarrhea.  No abdominal pain, no melena or hematochezia.  No  hematemesis, no visual changes, no asymmetric weakness, no facial  asymmetry.  No focal neurological symptoms.  In the ED, the patient was  found to have a white count of 13.5, hemoglobin of 12.2, platelets of  270, hematocrit of 36.9, ANC of 10.1, sodium of 137, potassium 5.7,  chloride 102, bicarb 28, BUN 10, creatinine 0.72, glucose 103, calcium  of 8.9.  UA was negative for a urinary tract infection.  BNP was 168.  Chest x-ray with asymmetric interstitial air space process, probably  pulmonary edema, probable underlying chronic lung changes/fibrosis  appears to be nodularity and cannot exclude metastatic disease.  Point  of care cardiac markers were negative.  EKG with normal sinus rhythm  with a right bundle branch block.  No significant ST-T wave changes.   We  were called to admit the patient for further evaluation and  recommendations.  The patient was given Lasix 80 mg IV push in the ED  with some improvement in her symptoms.  The patient denies any recent  travel, no immobility and no recent surgeries as well.   ALLERGIES:  NO KNOWN DRUG ALLERGIES.   PAST MEDICAL HISTORY:  1. Hypertension.  2. Obesity.  3. History of irregular heartbeat  4. Depression.  5. Osteoarthritis.  6. Status post bilateral knee surgeries and also cortisone injections.  7. Questionable foraminal valve.  The patient states has a hole      between her heart chambers.  8. Status post traumatic fall with broken left wrist status post      surgery.  9. Left carpal tunnel syndrome.   HOME MEDICATIONS.:  1. Lisinopril HCT 20/25 b.i.d.  2. Metoprolol 50 mg b.i.d.  3. Aspirin 325 mg daily.  4. KCl 20 mEq daily.  5. Amitriptyline 25 mg 2 tablets q.h.s.  6. Ativan 1 mg t.i.d. and p.r.n.  7. Norvasc 10 mg daily.  8. Vistaril 25 mg p.o.  p.r.n.   SOCIAL HISTORY:  The patient lives in [place] with her husband denies  any tobacco abuse.  No alcohol use.  No IV drug use.  Has three sons,  all of whom are healthy.   FAMILY HISTORY:  Mother deceased age 65 from acute MI, also had a prior  history of tuberculosis.  Father deceased age 62 from lung cancer.  Brother deceased age 80 from colon cancer.  Two brothers living age 71  and 19 with history of coronary artery disease/MI.  Two sisters who are  living with heart murmurs.  Family history also significant for  hypertension and CVA.   REVIEW OF SYSTEMS:  As per HPI, otherwise negative.   PHYSICAL EXAMINATION:  VITAL SIGNS:  Temperature 97.8, blood pressure  176/83, pulse of 80, respiratory rate 20, sating 86% on room air which  went up to 96% on 3 liters nasal cannula.  GENERAL:  Patient in no apparent distress.  HEENT:  Normocephalic, atraumatic.  Pupils equal, round and reactive to  light.  Extraocular  movements intact.  Oropharynx is clear.  No lesions,  no exudates.  Dry mucous membranes.  NECK:  Supple.  No lymphadenopathy.  RESPIRATORY:  Bilateral crackles.  CARDIOVASCULAR:  Regular rate and rhythm.  No murmurs, rubs or gallops.  Positive JVD.  ABDOMEN:  Soft, mild distention.  Positive bowel sounds.  Nontender, no  rebound or guarding.  EXTREMITIES:  No clubbing or cyanosis, 2+ bilateral lower extremity  edema.  NEUROLOGICAL:  The patient is alert and oriented x3.  Cranial nerves II-  XII are grossly intact.  No focal deficits.   LABORATORY DATA:  BNP 168.  CBC white count 13.5, hemoglobin 12.2,  platelets 270, hematocrit 36.9, ANC of 10.1, sodium 137, potassium 5.7,  chloride 102, bicarb 28, BUN 10, creatinine 0.72, glucose of 103,  calcium of 8.9.  UA is yellow, clear.  Specific gravity 1.008, pH of 7,  glucose negative, bilirubin negative, ketones negative, blood negative,  protein negative, urobilinogen 1.0, nitrite negative, leukocytes  negative.  Point of care cardiac markers CK-MB 1.1, troponin I less than  0.05, myoglobin 89.1.  Chest x-ray shows asymmetric interstitial air  space process may reflect pulmonary edema probable underlying chronic  lung changes/fibrosis.  Appears to be nodularity and cannot exclude  metastatic disease.  CT of the chest may be helpful for further  evaluation.  CT of the chest is pending at the time of admission.  EKG  normal sinus rhythm, right bundle branch block, T-wave inversion in V3.   ASSESSMENT AND PLAN:  [name] is a 76 year old female with  history of hypertension, depression, osteoarthritis, questionable  irregular heartbeat, family history of coronary artery disease who  presents to the ED with a 2-week history of worsening shortness of  breath.  1. Shortness of breath/bilateral lower extremity edema, likely      multifactorial secondary to CHF exacerbation versus pneumonia.  The      patient with an elevated white  count.  PE is unlikely as the      patient denies any recent travel, no immobility no recent      surgeries.  No chest pain.  We will check cardiac enzymes q.8 h x3.      Check a TSH.  Check a PTT.  Check a PT.  Check an INR.  Check      hepatic panel.  Check a 2-D echo to rule out LV dysfunction.  Check  a hemoglobin A1c.  Check a sputum Gram stain and culture.  Check a      urine Legionella and pneumococcus antigen.  Check a magnesium      level.  CT of the chest is pending.  We will admit to telemetry.      Strict I's and O's, daily weights, Lasix 40 mg IV q.12 h.  Continue      home dose beta blocker, aspirin and ACE inhibitor, heparin for      prophylaxis, Avelox for probable/possible pneumonia.  May need a      Cardiology consult in the morning for a new onset CHF.      Hyperkalemia likely secondary to exogenous KCl on the patient's      home medication list.  The patient has been diuresed with Lasix.      Will follow.  2. Constipation.  Dulcolax.  3. Hypertension.  Continue beta-blocker, ACE inhibitor and Lasix.  4. Depression.  Amitriptyline.  5. Prophylaxis:  Protonix for GI prophylaxis.  Heparin for DVT      prophylaxis.   It has been a pleasure taking care of Ms. [name].      Ramiro Harvest, MD  Electronically Signed     DT/MEDQ  D:  06/14/2008  T:  06/15/2008  Job:  [id number]   cc:   Deatra [name], M.D.  Fax: [phone]

## 2011-04-08 NOTE — Assessment & Plan Note (Signed)
[facility]                            CARDIOLOGY OFFICE NOTE   Lori [name], Lori [name]                       MRN:          [id number]  DATE:07/11/2008                            DOB:          Lori [name], Lori [name]    Lori [name] comes in today for close followup.  I saw her 10 days ago as  a new patient for what sounds like acute-on-chronic diastolic heart  failure.  She also has O2 dependency since her hospitalization.  She was  hypertensive in the office and we made some changes.  She has had a  normal coronary cath in 2001.  She had an abnormal CT with mediastinal  adenopathy, needs followup in 6 and 3 months.  She had mild to moderate  aortic insufficiency with EF of 70% with normal right-sided structures  and function by 2-D echo.   We changed her lisinopril 20 mg p.o. b.i.d.  We increased her Lasix to  80 mg p.o. b.i.d.  We increased potassium 20 mEq b.i.d. and we increased  her metoprolol to 50 b.i.d..   Since we saw her, she has lost 7 pounds on her scales.  She is feeling  better.  She is not wearing oxygen sometimes in the afternoons.  She  denies orthopnea or PND.  Her peripheral edema is improved.   PHYSICAL EXAMINATION:  VITAL SIGNS:  Her blood pressure 128/71, her  pulse is 80 and regular.  She is in no acute distress.  Respirations 18,  O2 sat on room air was 93%.  Weight is down to 199, which is about a 5-7  pounds loss since we saw her.  Her scales she has had a 7 pounds loss.  NECK:  No JVD.  LUNGS:  Few inspiratory, expiratory rhonchi and crackles particularly in  the left base, otherwise negative and sounds much better.  HEART:  Regular rate and rhythm.  No gallop.  ABDOMEN:  Soft.  Good bowel sounds.  EXTREMITIES:  1+ edema.   Lori [name] made a lot of progress.  We will increase metoprolol to 75  b.i.d. from 50 b.i.d. for better rate control and improving diastolic  filling time.  We will continue with her other medications.  She can  wear O2 p.r.n..  We will see her back in 2 weeks.  We will check a Chem-  7 today.     Thomas C. Daleen Squibb, MD, [facility]  Electronically Signed    TCW/MedQ  DD: 07/11/2008  DT: 07/12/2008  Job #: [id number]   cc:   Deatra [name], M.D.

## 2011-04-08 NOTE — Assessment & Plan Note (Signed)
[facility]                            CARDIOLOGY OFFICE NOTE   ELLEE, WAWRZYNIAK                       MRN:          [id number]  DATE:06/29/2008                            DOB:          Apr 18, 1935    ADDENDUM   I think the CT scan showed a ground-glass patchy appearance.  If she  does not improve, i.e. come off O2 and improve shortness of breath and  O2 sats, wishes to consider pulmonary evaluation for primary or  secondary lung disease.     Thomas C. Daleen Squibb, MD, [facility]     TCW/MedQ  DD: 06/29/2008  DT: 06/30/2008  Job #: [id number]

## 2011-04-08 NOTE — Letter (Signed)
June 30, 2008    Julya Alioto  Fax #[phone]   RE:  AIMA, MCWHIRT  MRN:  [id number]  /  DOB:  [birth year]   To Whom It May Concern:   Mrs. Monaco is a patient of mine here at [facility].  She was  just discharged from the hospital with acute congestive heart failure.  She is still extraordinarily weak.  When I evaluated her in the clinic  yesterday, she is still wearing oxygen 24 hours per day.  We are making  significant adjustments in her medications.   I have advised her and family not to have her fly to [facility] or  anywhere else.  Please excuse her for medical reasons and politely  refund her plane tickets.    Sincerely,      Jesse Sans. Daleen Squibb, MD, [facility]  Electronically Signed    TCW/MedQ  DD: 06/30/2008  DT: 07/01/2008  Job #: [id number]

## 2011-04-08 NOTE — Assessment & Plan Note (Signed)
[facility]                            CARDIOLOGY OFFICE NOTE   Lori [name], Lori [name]                       MRN:          [id number]  DATE:09/13/2008                            DOB:          Nov 25, 1934    Lori [name] returns today for further management of her chronic  diastolic heart failure.   She has lost an additional 4 pounds.  She feels the best she has felt.   She saw Dr. Marcelyn Bruins for her history of interstitial lung disease,  as well as O2 desaturation.   Her sleep evaluation demonstrated no sleep apnea, but she does desat at  night below 90%.  He recommended 2 liters of O2 at night.   She is currently taking potassium 20 mEq b.i.d., lorazepam 1 mg p.o.  t.i.d., nortriptyline 50 mg nightly, vitamin C, metoprolol 75 mg p.o.  b.i.d., lisinopril 20 mg p.o. b.i.d., Lasix 40 mg p.o. b.i.d., Norvasc 5  mg a day, and O2 at night.   PHYSICAL EXAMINATION:  VITAL SIGNS:  Her blood pressure today is  excellent at 132/70, her pulse is 70 and regular.  Her weight is down to  190 with her original weight when we first saw her was 204.  GENERAL:  She is in no acute distress.  SKIN:  Warm and dry.  Her lips were not blue.  Her fingertips were not  blue.  She is not wearing O2.  Respirations 18 unlabored.  HEENT:  There is no JVD.  Carotids are full.  LUNGS:  Clear to auscultation.  Few dry crackles in the bases.  No  rhonchi or wheezes.  HEART:  Normal S1 and S2.  No gallop.  ABDOMINAL:  Soft.  EXTREMITIES:  Some tenderness in the skin, but no sign of DVT.  Pulses  are present.  She has only trace 1+ pitting edema.   ASSESSMENT AND PLAN:  Lori [name] is doing remarkably well.  I think at  this point she just needs to work on weight loss that is not fluid  related.  I would like for her to try to lose 3-4 pounds per month.  I  think this will help tremendously with her lung disease, her dyspnea,  her O2 desaturation, and progressive development of  pulmonary  hypertension, and heart failure.  I had a long talk with her and her  daughter-in-law today.   I will see her back in 3 months.     Thomas C. Daleen Squibb, MD, [facility]  Electronically Signed    TCW/MedQ  DD: 09/13/2008  DT: 09/14/2008  Job #: [id number]

## 2011-04-08 NOTE — Assessment & Plan Note (Signed)
[facility]                            CARDIOLOGY OFFICE NOTE   DAYAN, KREIS                       MRN:          [id number]  DATE:06/29/2008                            DOB:          June 15, 1935    I was asked by Dr. Wynelle Link to consult on Lori [name] for new onset  diastolic congestive heart failure.   Lori [name] is a 76 year old married white female who is admitted to  [facility] on June 14, 2008.  She presented with lower  extremity edema, shortness of breath, and chest pain.  She presented  also with a cough.   She was found to be in diastolic heart failure.  CT scan confirmed this  and ruled out pneumonia.  She also had some mediastinal adenopathy and  needs followup.   She was diuresed and her O2 sats gradually improved.   A 2D echocardiogram showed EF 55-60%, pseudonormal left ventricular  filling pattern, elevated mean left atrial filling pressure, mild-to-  moderate aortic regurgitation with mild aortic valve thickening.  Right-  sided structures and function are reported as normal.   She was discharged home and has slowly gained her weight back from 198  to 202 this morning.  She weighs 204 in my office.   She states that she denies any orthopnea or PND.  She is wearing O2 24/7  and was not wearing this prior to hospitalization.   PAST MEDICAL HISTORY:  She had a normal cardiac catheterization with  normal coronary angiography in 2001 by Dr. Meade Maw.   CURRENT MEDICATIONS:  1. Lasix 40 mg p.o. b.i.d.  2. O2 3 liters, nasal cannula.  3. Lisinopril and hydrochlorothiazide 20/25 two b.i.d.  4. Metoprolol succinate 50 mg a day.  5. Enteric-coated aspirin 325 mg a day.  6. Potassium 20 mEq a day.  7. Multivitamin daily.  8. Norvasc 10 mg a day.  9. Vitamin C.  10.Nortriptyline 50 mg nightly.  11.Lorazepam 1 mg p.o. t.i.d.   She is on some p.r.n. that are listed in the chart.  She has no known  drug  allergies.   Her other medical problems include, history of hypertension, obesity,  irregular heartbeat, depression, and osteoarthritis.  She has had a left  carpal tunnel syndrome.  She has had bilateral knee surgeries.  She has  been told that she might have a patent foramen ovale, but this was not  reported on her echo.  She has had a traumatic fall with a broken left  wrist in the past.   SOCIAL HISTORY:  She lives in [place] with her husband.  She denies  any tobacco use or alcohol use.  She has never smoked.  She has 3 sons.   FAMILY HISTORY:  Mother died of a heart attack at age 2.  Father died  of lung cancer at 57.  Two brothers are in their 22s and have a history  of coronary artery disease and MI.  She has 2 sisters, who did not have  coronary artery disease.   REVIEW OF SYSTEMS:  In addition, to  the above is negative.  All systems  were questioned.  All outside records were reviewed.   PHYSICAL EXAMINATION:  VITAL SIGNS:  Her blood pressure today is 153/82.  Her pulse is 89 and regular.  She weighs 204 pounds.  Her respiratory  rate is 20 and unlabored.  GENERAL:  She is sitting in a chair, wearing O2 nasal cannula.  She is  in no acute distress.  SKIN:  Warm and dry.  She is slightly pale.  Skin shows few ecchymoses.  HEENT:  Normocephalic and atraumatic.  PERRLA.  Extraocular movements  intact.  Sclerae clear.  Facial symmetry is normal.  NECK:  Supple.  Carotid upstrokes were equal bilaterally without bruits.  There is no obvious JVD.  Thyroid is not enlarged.  Trachea is midline.  LUNGS:  Clear except for bibasilar rales.  HEART:  Regular rate and rhythm.  PMI poorly appreciated.  There is no  gallop.  ABDOMEN:  Soft.  Good bowel sounds.  Organomegaly cannot be assessed,  secondary to weight and sitting in a chair.  EXTREMITIES:  Pitting edema 2+ up to the mid tibia.  Pulses are intact  in both posterior tibial and dorsalis pedis.  There is no sign of DVT.   NEURO:  Grossly intact.   EKG shows sinus rhythm with possible left atrial enlarge.  She has a  right bundle, left ventricular hypertrophy, and repolarization changes.   ASSESSMENT:  1. Acute on chronic diastolic heart failure.  This is probably      secondary to age, hypertension, and obesity.  2. O2 dependency at present, hopefully can be reversed.  3. Obesity.  4. Hypertension, under poor control.  5. Normal coronary arteries by cath in 2001.  6. Mediastinal adenopathy, which needs followup CT in 3 months.  7. Mild-to-moderate aortic insufficiency.  8. Normal right-sided structures and function.   PLAN:  1. Discontinue lisinopril and hydrochlorothiazide.  We will change      this to lisinopril 20 mg p.o. b.i.d.  2. Increase Lasix to 80 mg p.o. b.i.d.  3. Increase potassium to 20 mEq b.i.d.  4. Chem-7 today.  5. Increase metoprolol to 50 b.i.d.  6. Weigh daily.  7. Follow up with me in 10 days.   ADDENDUM:  I think the CT scan showed a ground-glass patchy appearance.  If she does not improve, i.e. come off O2 and improve shortness of  breath and O2 sats, wishes to consider pulmonary evaluation for primary  or secondary lung disease.     Thomas C. Daleen Squibb, MD, [facility]  Electronically Signed    TCW/MedQ  DD: 06/29/2008  DT: 06/30/2008  Job #: [id number]   cc:   Deatra [name], M.D.

## 2011-04-08 NOTE — Discharge Summary (Signed)
NAMEKASHENA, NOVITSKI NO.:  [id number]   MEDICAL RECORD NO.:  [id number]          PATIENT TYPE:  INP   LOCATION:  1401                         [facility]ACILITY[facility]  [facility]   PHYSICIAN:  Hollice Espy, M.D.DATE OF BIRTH:  08-Aug-1935   DATE OF ADMISSION:  06/14/2008  DATE OF DISCHARGE:  06/17/2008                               DISCHARGE SUMMARY   PRIMARY CARE PHYSICIAN:  Deatra [name], M.D.   CARDIOLOGIST:  Dr. Armanda Magic, although she is considering following  up with Dr. Daleen Squibb of cardiology.   CHIEF COMPLAINT:  Shortness of breath.   HISTORY OF PRESENT ILLNESS:  The patient is a 76 year old white female  with a past medical history of hypertension, arrhythmia, obesity, and  arthritis who presented with several days of shortness of breath but no  chest pain.  She was found to have a finding of a cough.  This has been  going for several weeks.  In the emergency room the patient was found to  have a white count of 13.5.  There was a question of whether she had  infection versus CHF.  A BNP, however, was slightly elevated at 168.  The patient had a CT scan of her chest done, which showed no evidence of  pneumonia and more consistent with CHF.  In addition, she was afebrile.  Following this, antibiotics were discontinued after CT was done.  The  patient was started on a diuretic.  She diuresed quite a bit and in that  time her oxygenation has improved somewhat however, from 4 liters down  to 2 liters.  The patient is currently satting 94% on 2 liters at rest.  However, when she tried to ambulate, even on 2 liters, she dropped down  to 86% on 2 liters with ambulation.  At this point it was felt that the  patient would benefit from staying on oxygen and going home on Lasix.  An echocardiogram was done, which showed evidence of a preserved  ejection fraction of 55-60% but a pseudonormal left ventricular filling  pattern, elevated mean left atrial filling pressure, and  mild-to-  moderate aortic valvular regurgitation.  It is very likely the patient  had some diastolic dysfunction but preserved systolic function.  After a  discussion with the family and the patient, the patient has received  education for CHF.  The plan will be for her to be discharged home on  home 02 three liters.  A CHF nurse will follow up as well.  The patient  will follow up with Dr. Wynelle Link in 2 weeks.  In the meantime, she will stay  on oxygen and increase Lasix dose.  She will follow up with cardiology  next week.  She is already set up with Dr. Mayford Knife although the patient  is thinking about changing to a family friend, Dr. Daleen Squibb.  Either way, I  have advised the patient that they need to have follow up next week but  they tell me they will not be able to do so.  The patient's overall  disposition is improved.  Her activity will be slow to  increase with  continuous oxygen.  Her discharge diet will be a heart-healthy diet.  She has received CHF education and she is being discharged to home.  Blood pressures have remained stable and this medication will be  continued.   DISCHARGE DIAGNOSES:  1. New onset acute diastolic congestive heart failure.  2. Continued hypoxia.  3. Hypertension.  4. Previous history of arrhythmia.  5. Depression.  6. Osteoporosis.   DISCHARGE MEDICATIONS:  Lasix 40 p.o. b.i.d., oxygen 3 liters  continuous, lisinopril/HCTZ 20/2.5 two tabs p.o. b.i.d., metoprolol 50  p.o. daily, aspirin 325, K-Dur 20 mEq, multivitamin, calcium,  amitriptyline 50 q.h.s., Ativan 1 four times a day, Vistaril p.r.n., and  Norvasc 10 daily.      Hollice Espy, M.D.  Electronically Signed     SKK/MEDQ  D:  06/17/2008  T:  06/17/2008  Job:  [id number]   cc:   Armanda Magic, M.D.  Fax: [phone]   Jesse Sans. Wall, MD, FACC  [address]  [address]  [place]  [place] [address]   Deatra [name], M.D.  Fax: [phone]

## 2011-04-08 NOTE — Assessment & Plan Note (Signed)
[facility]                            CARDIOLOGY OFFICE NOTE   WITNEY, HUIE                       MRN:          [id number]  DATE:07/28/2008                            DOB:          25-Mar-1935    [name] returns today.  There has been a lot of stress going on at  home and she is actually moving in with her son and daughter-in-law who  is with her today in [place].  Her name is Lori [name].   She has lost an additional 3 pounds.  Her blood pressures are now around  100-110, she does actually had one value of 91/52.  Heart rate is in the  60s and 70s most of the time.  She is still wearing O2.   Melissa tells me that she clearly has had some interstitial lung disease  by lung biopsy in [place] a few years ago.  In addition, she was told  she had sleep apnea.   Her exam today, her blood pressure is 100/56, her pulse is 64 and  regular, and her weight is 194 which is down an additional 5 pounds of  my scales.  She is wearing her O2.  She is very emotional.  HEENT is  unchanged.  Neck shows no JVD.  Lungs reveal bibasilar crackles.  Heart  reveals a nondisplaced PMI.  Normal S1 and S2.  Abdominal exam is  protuberant.  Good bowel sounds.  Extremities reveal only trace edema.  Pulses are intact.  Neuro exam is intact.   Lori [name] is now getting on the hypotensive side.  I think she is  euvolemic.  She clearly has sleep apnea and may be some interstitial  lung disease and these form a pulmonary and sleep evaluation.   PLAN:  1. Decrease Norvasc to 5 mg a day to increase her blood pressure.  2. Continue to wear O2 p.r.n.  3. Pulmonary sleep evaluation.  4. Chem-7 today.   We will get her back in about 6 weeks for close followup with me.     Thomas C. Daleen Squibb, MD, [facility]  Electronically Signed    TCW/MedQ  DD: 07/28/2008  DT: 07/29/2008  Job #: [id number]   cc:   Deatra [name], M.D.

## 2011-04-11 NOTE — Cardiovascular Report (Signed)
[place]  Patient:    Lori [name], Lori [name]                       MRN: [id number] Proc. Date: 07/02/00 Adm. Date:  [id number] Attending:  Mora Appl CC:         Abran Cantor. Clovis Riley, MD   Cardiac Catheterization  PROCEDURE PERFORMED:          Left heart catheterization, coronary angiography, single plane ventriculogram.  INDICATIONS:                  Atypical chest pain, significant coronary risk factors, equivocal Cardiolite showing decreased perfusion at the base with reversibility, decreased sensitivity of the test based on increased good uptake.  Normal wall motion with an ejection fraction of 70%.  DESCRIPTION OF PROCEDURE:     After obtaining written informed consent, the patient was brought to the cardiac catheterization lab in a postabsorptive state.  Preoperative sedation was achieved using IV Versed.  Local anesthesia was achieved using 1% Xylocaine.  A 6 French hemostasis sheath was placed into the right femoral artery after identifying the femoral head with radiographic technique.  Selective coronary angiography was performed using the JL4 and JR4 Judkins catheters.  Nonionic contrast was used and was hand injected. Multiple views were obtained.  Catheter exchanges were made over the guide wire. The hemostasis sheath was flushed following each injection.  FINDINGS:                     The aortic pressure was 145/72, LV pressure 145/32.  There was normal wall motion with an ejection fraction of 70%.  There was post-PPC MR only.  CORONARY ANGIOGRAPHY:         The left main coronary artery bifurcated into the left anterior descending and circumflex vessel.  There was no significant disease in the left main coronary artery.                                The left anterior descending.  The left anterior descending gave rise to a large D1, multiple small septal perforators, and ended as an apical recurrent branch.  There was no  significant disease in the left anterior descending or its branches.                                Circumflex vessel.  The circumflex vessel gave rise to a moderate size OM1, OM2, and ended as a small AV groove vessel. There was no significant disease in the circumflex or its branches.                                The right coronary artery was a moderate size vessel, dominant for posterior circulation, gave rise to a small RV marginal, moderate PDA, and moderate PL branch.  There was no significant disease in the right coronary artery or its branches.  IMPRESSION:                   Normal coronary angiography, normal systolic function.  Mild elevation in the end diastolic pressure of 20.  This was most likely related to her obesity and hypertension.  Other etiology should be considered for her chest pain. DD:  07/02/00 TD:  07/02/00 Job: [id number]

## 2011-07-08 ENCOUNTER — Telehealth: Payer: Self-pay | Admitting: Cardiology

## 2011-07-08 NOTE — Telephone Encounter (Signed)
I spoke with patient and she did not call about a list of medications for her husband. She was calling to make a 6 month appointment with Dr. Daleen Squibb Appointment made for 08/08/11 at 11:00am Mylo Red RN

## 2011-07-08 NOTE — Telephone Encounter (Signed)
Per pt husband calling, pt husband needs print out of pt RX list. Pt husband will be coming here to pick up RX list. Please call pt back when RX list is ready.

## 2011-07-09 ENCOUNTER — Telehealth: Payer: Self-pay | Admitting: Cardiology

## 2011-07-09 MED ORDER — LISINOPRIL 20 MG PO TABS: 20.0000 mg | ORAL_TABLET | Freq: Every day | ORAL | Status: DC

## 2011-07-09 MED ORDER — POTASSIUM CHLORIDE CRYS ER 20 MEQ PO TBCR: 20.0000 meq | EXTENDED_RELEASE_TABLET | Freq: Two times a day (BID) | ORAL | Status: DC

## 2011-07-09 MED ORDER — AMLODIPINE BESYLATE 10 MG PO TABS: 10.0000 mg | ORAL_TABLET | Freq: Every day | ORAL | Status: DC

## 2011-07-09 MED ORDER — METOPROLOL TARTRATE 100 MG PO TABS: 100.0000 mg | ORAL_TABLET | Freq: Two times a day (BID) | ORAL | Status: DC

## 2011-07-09 NOTE — Telephone Encounter (Signed)
Pt needs all of her meds to be call in to her pharmacy [facility] # [phone] [address]

## 2011-07-10 ENCOUNTER — Other Ambulatory Visit: Payer: Self-pay | Admitting: *Deleted

## 2011-07-10 MED ORDER — LISINOPRIL 20 MG PO TABS: 20.0000 mg | ORAL_TABLET | Freq: Every day | ORAL | Status: DC

## 2011-07-10 MED ORDER — METOPROLOL TARTRATE 100 MG PO TABS: 100.0000 mg | ORAL_TABLET | Freq: Two times a day (BID) | ORAL | Status: DC

## 2011-07-10 MED ORDER — AMLODIPINE BESYLATE 10 MG PO TABS: 10.0000 mg | ORAL_TABLET | Freq: Every day | ORAL | Status: DC

## 2011-07-10 MED ORDER — NITROGLYCERIN 0.4 MG SL SUBL: 0.4000 mg | SUBLINGUAL_TABLET | SUBLINGUAL | Status: DC | PRN

## 2011-07-10 MED ORDER — POTASSIUM CHLORIDE CRYS ER 20 MEQ PO TBCR: 20.0000 meq | EXTENDED_RELEASE_TABLET | Freq: Two times a day (BID) | ORAL | Status: DC

## 2011-08-08 ENCOUNTER — Encounter: Payer: Self-pay | Admitting: *Deleted

## 2011-08-08 ENCOUNTER — Encounter: Payer: Self-pay | Admitting: Cardiology

## 2011-08-08 ENCOUNTER — Ambulatory Visit (INDEPENDENT_AMBULATORY_CARE_PROVIDER_SITE_OTHER): Payer: Medicare Other | Admitting: Cardiology

## 2011-08-08 VITALS — BP 148/76 | HR 62 | Ht 60.0 in | Wt 175.0 lb

## 2011-08-08 DIAGNOSIS — I4510 Unspecified right bundle-branch block: Secondary | ICD-10-CM

## 2011-08-08 DIAGNOSIS — I10 Essential (primary) hypertension: Secondary | ICD-10-CM

## 2011-08-08 DIAGNOSIS — I5032 Chronic diastolic (congestive) heart failure: Secondary | ICD-10-CM

## 2011-08-08 DIAGNOSIS — I509 Heart failure, unspecified: Secondary | ICD-10-CM

## 2011-08-08 DIAGNOSIS — I359 Nonrheumatic aortic valve disorder, unspecified: Secondary | ICD-10-CM

## 2011-08-08 MED ORDER — FUROSEMIDE 40 MG PO TABS: 40.0000 mg | ORAL_TABLET | Freq: Every day | ORAL | Status: DC

## 2011-08-08 NOTE — Assessment & Plan Note (Signed)
Deteriorated. Her weight is up about 7-8 pounds. Will restart furosemide 80 mg daily with potassium replacement. She was back to 40 mg per day. I spent a long time talking to her by daily weights and not running out of her meds. I will see her back in close followup in about 6 weeks.

## 2011-08-08 NOTE — Patient Instructions (Addendum)
Today:  Take furosemide 80mg  and 2 of your potassium tablets  Resume your regular dose of furosemide and potassium tomorrow.  Your physician recommends that you schedule a follow-up appointment in: 6-8 weeks with Dr. Daleen Squibb

## 2011-08-08 NOTE — Assessment & Plan Note (Signed)
Deteriorated. Should improve with diuresis.

## 2011-08-08 NOTE — Assessment & Plan Note (Signed)
Stable clinically.

## 2011-08-08 NOTE — Progress Notes (Signed)
HPI Mrs. Kocsis returns today for evaluation and management of her diastolic heart failure.  She ran out of her Lasix and potassium because of money issues. She is on all generic drugs. We've instructed her to go to [employer] for  the [employer].  She has been very dyspneic on exertion. She denies orthopnea. Her feet are swollen. Her weight is up 7 pounds.   She denies any chest pain, palpitations or syncope. Past Medical History  Diagnosis Date  . RBBB   . HYPERTENSION   . DIASTOLIC HEART FAILURE, CHRONIC   . AORTIC INSUFFICIENCY   . OSTEOPOROSIS   . OBSTRUCTIVE SLEEP APNEA   . DEPRESSION   . CARDIAC MURMUR   . Osteoporosis     Past Surgical History  Procedure Date  . Rhinoplasty     Family History  Problem Relation Age of Onset  . Heart disease    . Cancer      History   Social History  . Marital Status: Married    Spouse Name: N/A    Number of Children: N/A  . Years of Education: N/A   Occupational History  . Not on file.   Social History Main Topics  . Smoking status: Never Smoker   . Smokeless tobacco: Not on file  . Alcohol Use: Not on file  . Drug Use: Not on file  . Sexually Active: Not on file   Other Topics Concern  . Not on file   Social History Narrative  . No narrative on file    Not on File  Current Outpatient Prescriptions  Medication Sig Dispense Refill  . amLODipine (NORVASC) 10 MG tablet Take 1 tablet (10 mg total) by mouth daily.  90 tablet  3  . Ascorbic Acid (VITAMIN C) 1000 MG tablet Take 1,000 mg by mouth daily.        Marland Kitchen aspirin 325 MG tablet Take 325 mg by mouth daily.        . cholecalciferol (VITAMIN D) 400 UNITS TABS Take 400 Units by mouth daily.        . furosemide (LASIX) 40 MG tablet Take 40 mg by mouth daily.        Marland Kitchen lisinopril (PRINIVIL,ZESTRIL) 20 MG tablet Take 40 mg by mouth daily.        . metoprolol (LOPRESSOR) 100 MG tablet Take 1 tablet (100 mg total) by mouth 2 (two) times daily.  180 tablet  3  .  nitroGLYCERIN (NITROSTAT) 0.4 MG SL tablet Place 1 tablet (0.4 mg total) under the tongue every 5 (five) minutes as needed for chest pain.  25 tablet  6  . potassium chloride SA (K-DUR,KLOR-CON) 20 MEQ tablet Take 1 tablet (20 mEq total) by mouth 2 (two) times daily.  180 tablet  3  . vitamin E 400 UNIT capsule Take 400 Units by mouth daily.          ROS Negative other than HPI.   PE General Appearance: well developed, well nourished in no acute distress HEENT: symmetrical face, PERRLA, good dentition  Neck: no JVD, thyromegaly, or adenopathy, trachea midline Chest: symmetric without deformity Cardiac: PMI non-displaced, RRR, normal S1, S2, no gallop or murmur Lung: inspiratory expiratory soft wheezes, few bibasilar ralesVascular: all pulses full without bruits  Abdominal: nondistended, nontender, good bowel sounds, no HSM, no bruits Extremities: no cyanosis, clubbing, 1+ edema bilaterallyno sign of DVT, no varicosities  Skin: normal color, no rashes Neuro: alert and oriented x 3, non-focal Pysch: normal  affect Filed Vitals:   08/08/11 1106  BP: 148/76  Pulse: 62  Height: 5' (1.524 m)  Weight: 175 lb (79.379 kg)    EKG  Labs and Studies Reviewed.   No results found for this basename: WBC, HGB, HCT, MCV, PLT      Chemistry      Component Value Date/Time   NA 141 01/01/2010 1140   K 4.4 01/01/2010 1140   CL 105 01/01/2010 1140   CO2 30 01/01/2010 1140   BUN 10 01/01/2010 1140   CREATININE 0.8 01/01/2010 1140      Component Value Date/Time   CALCIUM 9.6 01/01/2010 1140       No results found for this basename: CHOL   No results found for this basename: HDL   No results found for this basename: LDLCALC   No results found for this basename: TRIG   No results found for this basename: CHOLHDL   No results found for this basename: HGBA1C   No results found for this basename: ALT, AST, GGT, ALKPHOS, BILITOT   No results found for this basename: TSH

## 2011-08-22 LAB — URINALYSIS, ROUTINE W REFLEX MICROSCOPIC
Bilirubin Urine: NEGATIVE
Glucose, UA: NEGATIVE
Hgb urine dipstick: NEGATIVE
Protein, ur: NEGATIVE
Specific Gravity, Urine: 1.008
Urobilinogen, UA: 1

## 2011-08-22 LAB — LIPID PANEL
HDL: 35 — ABNORMAL LOW
Triglycerides: 65
VLDL: 13

## 2011-08-22 LAB — LEGIONELLA ANTIGEN, URINE: Legionella Antigen, Urine: NEGATIVE

## 2011-08-22 LAB — CBC
HCT: 36.9
Hemoglobin: 11.8 — ABNORMAL LOW
Hemoglobin: 12.2
MCHC: 33.3
MCV: 90.2
RBC: 3.92
RBC: 4.09
RDW: 13.9

## 2011-08-22 LAB — BASIC METABOLIC PANEL
BUN: 10
BUN: 11
BUN: 9
CO2: 28
Chloride: 101
Creatinine, Ser: 0.61
Creatinine, Ser: 0.71
GFR calc non Af Amer: >60
Glucose, Bld: 101 — ABNORMAL HIGH
Glucose, Bld: 103 — ABNORMAL HIGH
Glucose, Bld: 103 — ABNORMAL HIGH
Potassium: 3.4 — ABNORMAL LOW
Potassium: 5.7 — ABNORMAL HIGH

## 2011-08-22 LAB — DIFFERENTIAL
Basophils Relative: 0
Eosinophils Absolute: 0.5
Eosinophils Relative: 3
Lymphocytes Relative: 15
Lymphs Abs: 2
Monocytes Absolute: 0.9
Monocytes Absolute: 0.9
Monocytes Relative: 7
Monocytes Relative: 8
Neutro Abs: 10.1 — ABNORMAL HIGH

## 2011-08-22 LAB — MAGNESIUM: Magnesium: 2

## 2011-08-22 LAB — HEPATIC FUNCTION PANEL
AST: 28
Albumin: 3.1 — ABNORMAL LOW
Total Bilirubin: 0.9
Total Protein: 6.8

## 2011-08-22 LAB — POCT CARDIAC MARKERS
CKMB, poc: 1.1
Troponin i, poc: <0.05

## 2011-08-22 LAB — B-NATRIURETIC PEPTIDE (CONVERTED LAB): Pro B Natriuretic peptide (BNP): 134 — ABNORMAL HIGH

## 2011-08-22 LAB — CARDIAC PANEL(CRET KIN+CKTOT+MB+TROPI)
CK, MB: 1.1
Troponin I: 0.01
Troponin I: 0.01

## 2011-08-22 LAB — PROTIME-INR
INR: 1.1
Prothrombin Time: 14.6

## 2011-08-22 LAB — CK TOTAL AND CKMB (NOT AT ARMC)
Relative Index: INVALID
Total CK: 80

## 2011-08-22 LAB — TSH: TSH: 2.72

## 2011-08-22 LAB — TROPONIN I: Troponin I: 0.01

## 2011-09-24 ENCOUNTER — Encounter: Payer: Self-pay | Admitting: Cardiology

## 2011-09-24 ENCOUNTER — Ambulatory Visit (INDEPENDENT_AMBULATORY_CARE_PROVIDER_SITE_OTHER): Payer: Medicare Other | Admitting: Cardiology

## 2011-09-24 VITALS — BP 110/60 | HR 68 | Ht 60.0 in | Wt 170.0 lb

## 2011-09-24 DIAGNOSIS — G4733 Obstructive sleep apnea (adult) (pediatric): Secondary | ICD-10-CM

## 2011-09-24 DIAGNOSIS — I10 Essential (primary) hypertension: Secondary | ICD-10-CM

## 2011-09-24 DIAGNOSIS — I5032 Chronic diastolic (congestive) heart failure: Secondary | ICD-10-CM

## 2011-09-24 DIAGNOSIS — I509 Heart failure, unspecified: Secondary | ICD-10-CM

## 2011-09-24 DIAGNOSIS — I359 Nonrheumatic aortic valve disorder, unspecified: Secondary | ICD-10-CM

## 2011-09-24 DIAGNOSIS — I4510 Unspecified right bundle-branch block: Secondary | ICD-10-CM

## 2011-09-24 NOTE — Assessment & Plan Note (Signed)
Stable by exam. Follow clinically.

## 2011-09-24 NOTE — Assessment & Plan Note (Signed)
Stable. No change in treatment. 

## 2011-09-24 NOTE — Assessment & Plan Note (Signed)
Markedly improved. No changes in medical therapy.

## 2011-09-24 NOTE — Progress Notes (Signed)
HPI [name] returns today for evaluation and management of her chronic diastolic heart failure, hypertension, and aortic insufficiency. Chills has a chronic right bundle branch block.  She denies orthopnea, PND or edema. Her weight ranges between 169-171 at home. She has been compliant with her medication since her last visit when I reinforced this vigorously.  Her biggest complaint is that she does not sleep well. She is a diagnosis of sleep apnea but I do not see any formal evaluation. Will arrange with pulmonary and sleep clinic.   Past Medical History  Diagnosis Date  . RBBB   . HYPERTENSION   . DIASTOLIC HEART FAILURE, CHRONIC   . AORTIC INSUFFICIENCY   . OSTEOPOROSIS   . OBSTRUCTIVE SLEEP APNEA   . DEPRESSION   . CARDIAC MURMUR   . Osteoporosis     Past Surgical History  Procedure Date  . Rhinoplasty     Family History  Problem Relation Age of Onset  . Heart disease    . Cancer      History   Social History  . Marital Status: Married    Spouse Name: N/A    Number of Children: N/A  . Years of Education: N/A   Occupational History  . Not on file.   Social History Main Topics  . Smoking status: Never Smoker   . Smokeless tobacco: Not on file  . Alcohol Use: Not on file  . Drug Use: Not on file  . Sexually Active: Not on file   Other Topics Concern  . Not on file   Social History Narrative  . No narrative on file    No Known Allergies  Current Outpatient Prescriptions  Medication Sig Dispense Refill  . amLODipine (NORVASC) 10 MG tablet Take 1 tablet (10 mg total) by mouth daily.  90 tablet  3  . Ascorbic Acid (VITAMIN C) 1000 MG tablet Take 1,000 mg by mouth daily.        Marland Kitchen aspirin 325 MG tablet Take 325 mg by mouth daily.        . cholecalciferol (VITAMIN D) 400 UNITS TABS Take 400 Units by mouth daily.        . furosemide (LASIX) 40 MG tablet Take 40 mg by mouth daily.        Marland Kitchen lisinopril (PRINIVIL,ZESTRIL) 20 MG tablet Take 20 mg by mouth  daily.       Marland Kitchen LORazepam (ATIVAN) 1 MG tablet Take by mouth. Take one-half tablet by mouth every morning and 1 tablet at bedtime daily.       . metoprolol (LOPRESSOR) 100 MG tablet Take 1 tablet (100 mg total) by mouth 2 (two) times daily.  180 tablet  3  . nitroGLYCERIN (NITROSTAT) 0.4 MG SL tablet Place 1 tablet (0.4 mg total) under the tongue every 5 (five) minutes as needed for chest pain.  25 tablet  6  . nortriptyline (PAMELOR) 50 MG capsule Take 100 mg by mouth at bedtime.        . potassium chloride SA (K-DUR,KLOR-CON) 20 MEQ tablet Take 1 tablet (20 mEq total) by mouth 2 (two) times daily.  180 tablet  3  . vitamin E 400 UNIT capsule Take 400 Units by mouth daily.          ROS Negative other than HPI.   PE General Appearance: well developed, well nourished in no acute distress HEENT: symmetrical face, PERRLA, good dentition  Neck: no JVD, thyromegaly, or adenopathy, trachea midline Chest: symmetric without deformity  Cardiac: PMI non-displaced, RRR, normal S1, S2, no gallop, soft diastolic murmur left upper sternal border Lung: clear to ausculation and percussion Vascular: all pulses full without bruits  Abdominal: nondistended, nontender, good bowel sounds, no HSM, no bruits Extremities: no cyanosis, clubbing or edema, no sign of DVT, no varicosities  Skin: normal color, no rashes Neuro: alert and oriented x 3, non-focal Pysch: normal affect Filed Vitals:   09/24/11 1118  BP: 110/60  Pulse: 68  Height: 5' (1.524 m)  Weight: 170 lb (77.111 kg)    EKG  Labs and Studies Reviewed.   Lab Results  Component Value Date   WBC 11.2* 06/15/2008   HGB 11.8* 06/15/2008   HCT 35.4* 06/15/2008   MCV 90.2 06/15/2008   PLT 266 06/15/2008      Chemistry      Component Value Date/Time   NA 141 01/01/2010 1140   K 4.4 01/01/2010 1140   CL 105 01/01/2010 1140   CO2 30 01/01/2010 1140   BUN 10 01/01/2010 1140   CREATININE 0.8 01/01/2010 1140      Component Value Date/Time   CALCIUM 9.6  01/01/2010 1140   ALKPHOS 117 06/14/2008 2307   AST 28 06/14/2008 2307   ALT 23 06/14/2008 2307   BILITOT 0.9 06/14/2008 2307       Lab Results  Component Value Date   CHOL  Value: 114        ATP III CLASSIFICATION:  <200     mg/dL   Desirable  161-096  mg/dL   Borderline High  >=045    mg/dL   High 03/02/8118   Lab Results  Component Value Date   HDL 35* 06/15/2008   Lab Results  Component Value Date   LDLCALC  Value: 66        Total Cholesterol/HDL:CHD Risk Coronary Heart Disease Risk Table                     Men   Women  1/2 Average Risk   3.4   3.3 06/15/2008   Lab Results  Component Value Date   TRIG 65 06/15/2008   Lab Results  Component Value Date   CHOLHDL 3.3 06/15/2008   Lab Results  Component Value Date   HGBA1C  Value: 5.8 (NOTE)   The ADA recommends the following therapeutic goal for glycemic   control related to Hgb A1C measurement:   Goal of Therapy:   < 7.0% Hgb A1C   Reference: American Diabetes Association: Clinical Practice   Recommendations 2008, Diabetes Care,  2008, 31:(Suppl 1). 06/15/2008   Lab Results  Component Value Date   ALT 23 06/14/2008   AST 28 06/14/2008   ALKPHOS 117 06/14/2008   BILITOT 0.9 06/14/2008

## 2011-09-24 NOTE — Assessment & Plan Note (Addendum)
She had an evaluation in [place] 3 years ago. She was recommended option at one point but no CPAP. We'll not schedule reevaluation.

## 2011-09-24 NOTE — Patient Instructions (Signed)
Your physician wants you to follow-up in: 6 months with Dr. Wall.  You will receive a reminder letter in the mail two months in advance. If you don't receive a letter, please call our office to schedule the follow-up appointment.  

## 2011-12-12 ENCOUNTER — Ambulatory Visit: Payer: Medicare Other | Admitting: Physician Assistant

## 2012-01-13 ENCOUNTER — Other Ambulatory Visit: Payer: Self-pay | Admitting: *Deleted

## 2012-01-13 MED ORDER — POTASSIUM CHLORIDE CRYS ER 20 MEQ PO TBCR: 20.0000 meq | EXTENDED_RELEASE_TABLET | Freq: Two times a day (BID) | ORAL | Status: DC

## 2012-08-09 ENCOUNTER — Other Ambulatory Visit: Payer: Self-pay | Admitting: Cardiology

## 2012-08-09 MED ORDER — NITROGLYCERIN 0.4 MG SL SUBL: 0.4000 mg | SUBLINGUAL_TABLET | SUBLINGUAL | Status: DC | PRN

## 2012-08-09 MED ORDER — LISINOPRIL 20 MG PO TABS: 20.0000 mg | ORAL_TABLET | Freq: Every day | ORAL | Status: DC

## 2012-08-09 MED ORDER — AMLODIPINE BESYLATE 10 MG PO TABS: 10.0000 mg | ORAL_TABLET | Freq: Every day | ORAL | Status: DC

## 2012-08-09 MED ORDER — METOPROLOL TARTRATE 100 MG PO TABS: 100.0000 mg | ORAL_TABLET | Freq: Two times a day (BID) | ORAL | Status: DC

## 2012-08-17 ENCOUNTER — Inpatient Hospital Stay (HOSPITAL_COMMUNITY)
Admission: AD | Admit: 2012-08-17 | Discharge: 2012-08-19 | DRG: 194 | Disposition: A | Discharge status: Home / Self Care | Payer: Medicare Other | Source: Ambulatory Visit | Attending: Cardiovascular Disease | Admitting: Cardiovascular Disease

## 2012-08-17 ENCOUNTER — Inpatient Hospital Stay (HOSPITAL_COMMUNITY): Payer: Medicare Other

## 2012-08-17 ENCOUNTER — Encounter: Payer: Self-pay | Admitting: Nurse Practitioner

## 2012-08-17 ENCOUNTER — Ambulatory Visit (INDEPENDENT_AMBULATORY_CARE_PROVIDER_SITE_OTHER): Payer: Medicare Other | Admitting: Nurse Practitioner

## 2012-08-17 ENCOUNTER — Encounter (HOSPITAL_COMMUNITY): Payer: Self-pay | Admitting: General Practice

## 2012-08-17 VITALS — BP 96/52 | HR 112 | Ht 60.0 in | Wt 143.0 lb

## 2012-08-17 DIAGNOSIS — I359 Nonrheumatic aortic valve disorder, unspecified: Secondary | ICD-10-CM | POA: Diagnosis present

## 2012-08-17 DIAGNOSIS — J189 Pneumonia, unspecified organism: Principal | ICD-10-CM | POA: Diagnosis present

## 2012-08-17 DIAGNOSIS — G4733 Obstructive sleep apnea (adult) (pediatric): Secondary | ICD-10-CM | POA: Diagnosis present

## 2012-08-17 DIAGNOSIS — M810 Age-related osteoporosis without current pathological fracture: Secondary | ICD-10-CM | POA: Diagnosis present

## 2012-08-17 DIAGNOSIS — I10 Essential (primary) hypertension: Secondary | ICD-10-CM | POA: Diagnosis present

## 2012-08-17 DIAGNOSIS — R55 Syncope and collapse: Secondary | ICD-10-CM | POA: Insufficient documentation

## 2012-08-17 DIAGNOSIS — D72829 Elevated white blood cell count, unspecified: Secondary | ICD-10-CM | POA: Diagnosis not present

## 2012-08-17 DIAGNOSIS — I509 Heart failure, unspecified: Secondary | ICD-10-CM

## 2012-08-17 DIAGNOSIS — Z9119 Patient's noncompliance with other medical treatment and regimen: Secondary | ICD-10-CM

## 2012-08-17 DIAGNOSIS — Z91199 Patient's noncompliance with other medical treatment and regimen due to unspecified reason: Secondary | ICD-10-CM

## 2012-08-17 DIAGNOSIS — J9610 Chronic respiratory failure, unspecified whether with hypoxia or hypercapnia: Secondary | ICD-10-CM

## 2012-08-17 DIAGNOSIS — D649 Anemia, unspecified: Secondary | ICD-10-CM

## 2012-08-17 DIAGNOSIS — I4510 Unspecified right bundle-branch block: Secondary | ICD-10-CM | POA: Diagnosis present

## 2012-08-17 DIAGNOSIS — R0600 Dyspnea, unspecified: Secondary | ICD-10-CM

## 2012-08-17 DIAGNOSIS — I5032 Chronic diastolic (congestive) heart failure: Secondary | ICD-10-CM | POA: Diagnosis present

## 2012-08-17 DIAGNOSIS — K921 Melena: Secondary | ICD-10-CM | POA: Diagnosis present

## 2012-08-17 DIAGNOSIS — Z79899 Other long term (current) drug therapy: Secondary | ICD-10-CM

## 2012-08-17 DIAGNOSIS — F329 Major depressive disorder, single episode, unspecified: Secondary | ICD-10-CM | POA: Diagnosis present

## 2012-08-17 DIAGNOSIS — R918 Other nonspecific abnormal finding of lung field: Secondary | ICD-10-CM

## 2012-08-17 DIAGNOSIS — F3289 Other specified depressive episodes: Secondary | ICD-10-CM | POA: Diagnosis present

## 2012-08-17 DIAGNOSIS — Z7982 Long term (current) use of aspirin: Secondary | ICD-10-CM

## 2012-08-17 HISTORY — DX: Major depressive disorder, single episode, unspecified: F32.9

## 2012-08-17 HISTORY — DX: Unspecified osteoarthritis, unspecified site: M19.90

## 2012-08-17 HISTORY — DX: Headache: R51

## 2012-08-17 HISTORY — DX: Depression, unspecified: F32.A

## 2012-08-17 HISTORY — DX: Other disorders of lung: J98.4

## 2012-08-17 HISTORY — DX: Headache, unspecified: R51.9

## 2012-08-17 HISTORY — DX: Anxiety disorder, unspecified: F41.9

## 2012-08-17 LAB — PRO B NATRIURETIC PEPTIDE: Pro B Natriuretic peptide (BNP): 117.6 pg/mL (ref 0–450)

## 2012-08-17 LAB — D-DIMER, QUANTITATIVE: D-Dimer, Quant: 0.59 ug/mL-FEU — ABNORMAL HIGH (ref 0.00–0.48)

## 2012-08-17 LAB — MAGNESIUM: Magnesium: 2 mg/dL (ref 1.5–2.5)

## 2012-08-17 LAB — COMPREHENSIVE METABOLIC PANEL
AST: 18 U/L (ref 0–37)
BUN: 20 mg/dL (ref 6–23)
CO2: 28 mEq/L (ref 19–32)
Calcium: 10.3 mg/dL (ref 8.4–10.5)
Chloride: 100 mEq/L (ref 96–112)
Creatinine, Ser: 0.99 mg/dL (ref 0.50–1.10)
GFR calc Af Amer: 63 mL/min — ABNORMAL LOW (ref >90)
GFR calc non Af Amer: 54 mL/min — ABNORMAL LOW (ref >90)
Glucose, Bld: 111 mg/dL — ABNORMAL HIGH (ref 70–99)
Total Bilirubin: 0.3 mg/dL (ref 0.3–1.2)

## 2012-08-17 LAB — CBC WITH DIFFERENTIAL/PLATELET
Eosinophils Absolute: 0.2 10*3/uL (ref 0.0–0.7)
Eosinophils Relative: 1 % (ref 0–5)
HCT: 33.3 % — ABNORMAL LOW (ref 36.0–46.0)
Lymphocytes Relative: 22 % (ref 12–46)
Lymphs Abs: 3.6 10*3/uL (ref 0.7–4.0)
MCH: 29.9 pg (ref 26.0–34.0)
MCV: 88.1 fL (ref 78.0–100.0)
Monocytes Absolute: 1.2 10*3/uL — ABNORMAL HIGH (ref 0.1–1.0)
Monocytes Relative: 7 % (ref 3–12)
RBC: 3.78 MIL/uL — ABNORMAL LOW (ref 3.87–5.11)
WBC: 16.4 10*3/uL — ABNORMAL HIGH (ref 4.0–10.5)

## 2012-08-17 LAB — CK TOTAL AND CKMB (NOT AT ARMC)
CK, MB: 1.9 ng/mL (ref 0.3–4.0)
Total CK: 97 U/L (ref 7–177)

## 2012-08-17 LAB — PROTIME-INR: Prothrombin Time: 13.7 seconds (ref 11.6–15.2)

## 2012-08-17 LAB — TROPONIN I: Troponin I: <0.3 ng/mL (ref <0.30)

## 2012-08-17 MED ORDER — FUROSEMIDE 40 MG PO TABS: 40.0000 mg | ORAL_TABLET | Freq: Every day | ORAL | Status: DC

## 2012-08-17 MED ORDER — LISINOPRIL 20 MG PO TABS: 20.0000 mg | ORAL_TABLET | Freq: Every day | ORAL | Status: DC

## 2012-08-17 MED ORDER — NITROGLYCERIN 0.4 MG SL SUBL: 0.4000 mg | SUBLINGUAL_TABLET | SUBLINGUAL | Status: DC | PRN

## 2012-08-17 MED ORDER — ALPRAZOLAM 0.25 MG PO TABS
0.2500 mg | ORAL_TABLET | Freq: Two times a day (BID) | ORAL | Status: DC | PRN
  Administered 2012-08-18 (×2): 0.25 mg via ORAL
  Filled 2012-08-17 (×2): qty 1

## 2012-08-17 MED ORDER — ACETAMINOPHEN 325 MG PO TABS
650.0000 mg | ORAL_TABLET | ORAL | Status: DC | PRN
  Administered 2012-08-17: 650 mg via ORAL
  Filled 2012-08-17: qty 2

## 2012-08-17 MED ORDER — ZOLPIDEM TARTRATE 5 MG PO TABS
5.0000 mg | ORAL_TABLET | Freq: Every evening | ORAL | Status: DC | PRN
  Administered 2012-08-17 – 2012-08-18 (×2): 5 mg via ORAL
  Filled 2012-08-17 (×2): qty 1

## 2012-08-17 MED ORDER — AMLODIPINE BESYLATE 10 MG PO TABS: 10.0000 mg | ORAL_TABLET | Freq: Every day | ORAL | Status: DC

## 2012-08-17 MED ORDER — SODIUM CHLORIDE 0.9 % IV SOLN
INTRAVENOUS | Status: DC
  Administered 2012-08-17: 18:00:00 via INTRAVENOUS

## 2012-08-17 MED ORDER — METOPROLOL TARTRATE 100 MG PO TABS: 100.0000 mg | ORAL_TABLET | Freq: Two times a day (BID) | ORAL | Status: DC

## 2012-08-17 MED ORDER — ONDANSETRON HCL 4 MG/2ML IJ SOLN: 4.0000 mg | Freq: Four times a day (QID) | INTRAMUSCULAR | Status: DC | PRN

## 2012-08-17 NOTE — Progress Notes (Signed)
  Echocardiogram 2D Echocardiogram has been performed.  Georgian Co 08/17/2012, 5:55 PM
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
## 2012-08-17 NOTE — Progress Notes (Signed)
Patient Name: Lori [name] Date of Encounter: 08/17/2012  Primary Care Provider:  Leanor Rubenstein, MD Primary Cardiologist:  T. Wall, MD  Patient Profile  77 year old female with history of diastolic heart failure presents with a two-month history of progressive dyspnea, weakness, chest pain, malaise, and melena.  Problem List   Past Medical History  Diagnosis Date  . RBBB   . HYPERTENSION   . DIASTOLIC HEART FAILURE, CHRONIC     a. 05/2008 Echo: EF 55-60%, Diast Dysfxn, mild-mod AI, triv MR  . AORTIC INSUFFICIENCY     a. mild-mod by echo 05/2008  . OSTEOPOROSIS   . OBSTRUCTIVE SLEEP APNEA   . DEPRESSION    Past Surgical History  Procedure Date  . Rhinoplasty     Allergies  No Known Allergies  HPI  77 year old female with the above problems.  She was last seen in clinic approximately one year ago.  Patient has had a difficult year and over the past few months has been the primary caregiver for her family and extended family.  Her husband has been in the hospital with several issues and 2 grandchildren, whom live with her, suffered a car accident with broken bones and have required assistance.  In that setting, she has also been taking care of her 26-month-old great-granddaughter.  Since June of this year, she has been feeling "lousy" with progressive malaise, dyspnea on exertion, weakness, intermittent chest pressure, and lightheadedness.  Sometime between June and now, she ran out of all of her medications.  She says she's been so busy and so stressed at home that she has lost track of time.  Over the past 2 weeks, symptoms have progressed even more and she has been noting dark stools.  She is frequently lightheaded and dizzy and often feels like she might pass out.  She has dyspnea with even minimal exertion and has been having chest pressure several times per week, occurring almost exclusively when she is resting.  She says that when she is busy and up doing something she doesn't  really notice it.  Because of her constellation of symptoms, she called and made this appointment today.  She is short of breath just speaking and having trouble finishing sentences.  She says she feels dizzy and having chest pressures since this morning.  Home Medications  Prior to Admission medications   Medication Sig Start Date End Date Taking? Authorizing Provider  amLODipine (NORVASC) 10 MG tablet Take 1 tablet (10 mg total) by mouth daily. 08/09/12 08/09/13 Yes Gaylord Shih, MD  Ascorbic Acid (VITAMIN C) 1000 MG tablet Take 1,000 mg by mouth daily.     Yes Historical Provider, MD  aspirin 325 MG tablet Take 325 mg by mouth daily.     Yes Historical Provider, MD  cholecalciferol (VITAMIN D) 400 UNITS TABS Take 400 Units by mouth daily.     Yes Historical Provider, MD  furosemide (LASIX) 40 MG tablet Take 40 mg by mouth daily.   08/08/11  Yes Gaylord Shih, MD  lisinopril (PRINIVIL,ZESTRIL) 20 MG tablet Take 1 tablet (20 mg total) by mouth daily. 08/09/12 08/09/13 Yes Gaylord Shih, MD  metoprolol (LOPRESSOR) 100 MG tablet Take 1 tablet (100 mg total) by mouth 2 (two) times daily. 08/09/12 08/09/13 Yes Gaylord Shih, MD  nitroGLYCERIN (NITROSTAT) 0.4 MG SL tablet Place 1 tablet (0.4 mg total) under the tongue every 5 (five) minutes as needed for chest pain. 08/09/12 08/09/13 Yes Gaylord Shih, MD  potassium  chloride SA (K-DUR,KLOR-CON) 20 MEQ tablet Take 1 tablet (20 mEq total) by mouth 2 (two) times daily. 01/13/12  Yes Gaylord Shih, MD  vitamin E 400 UNIT capsule Take 400 Units by mouth daily.     Yes Historical Provider, MD    Family History  Family History  Problem Relation Age of Onset  . Heart disease Sister   . Cancer Father     died in his 22's  . Other Mother     died @ 55 - "old age"  . Stroke Sister     deceased  . Cancer Brother     deceased    Social History  History   Social History  . Marital Status: Married    Spouse Name: N/A    Number of Children: N/A  . Years  of Education: N/A   Occupational History  . Not on file.   Social History Main Topics  . Smoking status: Never Smoker   . Smokeless tobacco: Not on file  . Alcohol Use: No  . Drug Use: No  . Sexually Active: Not Currently   Other Topics Concern  . Not on file   Social History Narrative   Lives in [place] with husband, grandson, grand-daughter, and Therapist, music.  Home life has been very stressful as she has been the primary caregiver for the entire family/extended family in setting of recent illness of her husband and recent MVA involving grandchildren.     Review of Systems General:  ++ chills and malaise.  No fever, night sweats or weight changes.  Cardiovascular:  ++ chest pain, dyspnea on exertion, edema, and presyncope/orthostasis.  No orthopnea, palpitations, paroxysmal nocturnal dyspnea. Dermatological: No rash, lesions/masses Respiratory: No cough.  Dyspnea as above. Urologic: No hematuria, dysuria Abdominal:   She has been having melena for the past 2 weeks.  No nausea, vomiting, diarrhea, bright red blood per rectum, or hematemesis. Neurologic:  No visual changes, wkns, changes in mental status. All other systems reviewed and are otherwise negative except as noted above.  Physical Exam  Blood pressure 96/52, pulse 112, height 5' (1.524 m), weight 143 lb (64.864 kg), SpO2 96.00%.  General: Pleasant, some increase wob and difficulty completing sentences.  Psych: Tearful, flat affect. Neuro: Alert and oriented X 3. Moves all extremities spontaneously. HEENT: Normal  Neck: Supple without bruits or JVD. Lungs:  Resp somewhat labored with scatt rhonchi and occas insp wheeze with diminished breath sounds in left base. Heart: RRR, tachy, no s3, s4, or murmurs. Abdomen: Soft, non-tender, non-distended, BS + x 4.  Extremities: No clubbing, cyanosis.  Trace bilat LE edema and tenderness to palpation over the ankles. DP/PT/Radials 2+ and equal bilaterally.  Accessory  Clinical Findings  ECG - sinus tach, 112, rbbb.  Assessment & Plan  1.  Dyspnea:  Progressive DOE over the past 3 months with a constellation of associated Ss including c/p, pre-syncope, malaise, and more recently chills.  Volume appears stable on exam, though she exhibits rhonchi with some insp wheezing and diminished breath sounds in the left base - ? PNA.  She is afebrile but tachycardic and bp is soft.  With h/o melena, worry also about GIB/anemia.  We will admit to [facility] for further eval.  Plan to check cxr, echo, and routine labs.  Pt is agreeable and EMS has been called for transport.  2.  Chronic Diast CHF:  As above, euvolemic.  F/U echo.  HR up in setting of acute illness.  BP soft.  Gently hydrate and watch volume closely while hospitalized.  3.  Melena:  She has a 2 wk h/o of black stools with progressive wkns and orthostasis.  Check CBC upon admission and guaiac stools.  She will likely require GI eval.  Transfuse if necessary.  4.  Mild - Mod AI:  F/u echo.  5.  Midsternal Chest Pain:  In setting of above, she has had chest pressure that occurs @ rest and lasts hours at a time.  Cycle CE, check d dimer.  If CE negative, plan myoview prior to d/c.    6.  Noncompliance:  She has been off of all of her meds for a few months.  With her pressure being soft, I will not resume any of her antihypertensives @ this time.  Hold off on asa in the setting of reported melena.  Nicolasa Ducking, NP 08/17/2012, 12:45 PM

## 2012-08-18 ENCOUNTER — Inpatient Hospital Stay (HOSPITAL_COMMUNITY): Payer: Medicare Other

## 2012-08-18 ENCOUNTER — Encounter (HOSPITAL_COMMUNITY): Payer: Self-pay | Admitting: Radiology

## 2012-08-18 DIAGNOSIS — R0609 Other forms of dyspnea: Secondary | ICD-10-CM

## 2012-08-18 DIAGNOSIS — D649 Anemia, unspecified: Secondary | ICD-10-CM

## 2012-08-18 DIAGNOSIS — I5032 Chronic diastolic (congestive) heart failure: Secondary | ICD-10-CM

## 2012-08-18 DIAGNOSIS — J9610 Chronic respiratory failure, unspecified whether with hypoxia or hypercapnia: Secondary | ICD-10-CM

## 2012-08-18 LAB — LIPID PANEL
HDL: 41 mg/dL (ref >39)
LDL Cholesterol: 66 mg/dL (ref 0–99)
Total CHOL/HDL Ratio: 3.1 RATIO
Triglycerides: 110 mg/dL (ref <150)

## 2012-08-18 LAB — CBC
Hemoglobin: 10 g/dL — ABNORMAL LOW (ref 12.0–15.0)
MCHC: 33.4 g/dL (ref 30.0–36.0)
RBC: 3.41 MIL/uL — ABNORMAL LOW (ref 3.87–5.11)
WBC: 11.8 10*3/uL — ABNORMAL HIGH (ref 4.0–10.5)

## 2012-08-18 LAB — BASIC METABOLIC PANEL
CO2: 27 mEq/L (ref 19–32)
Chloride: 102 mEq/L (ref 96–112)
GFR calc non Af Amer: 67 mL/min — ABNORMAL LOW (ref >90)
Glucose, Bld: 95 mg/dL (ref 70–99)
Potassium: 3.5 mEq/L (ref 3.5–5.1)
Sodium: 139 mEq/L (ref 135–145)

## 2012-08-18 LAB — URINE MICROSCOPIC-ADD ON

## 2012-08-18 LAB — URINALYSIS, ROUTINE W REFLEX MICROSCOPIC
Bilirubin Urine: NEGATIVE
Glucose, UA: NEGATIVE mg/dL
Ketones, ur: NEGATIVE mg/dL
Nitrite: NEGATIVE
Protein, ur: NEGATIVE mg/dL
pH: 6 (ref 5.0–8.0)

## 2012-08-18 LAB — CK TOTAL AND CKMB (NOT AT ARMC)
CK, MB: 1.7 ng/mL (ref 0.3–4.0)
Relative Index: INVALID (ref 0.0–2.5)

## 2012-08-18 LAB — HEMOGLOBIN A1C
Hgb A1c MFr Bld: 5.4 % (ref <5.7)
Mean Plasma Glucose: 108 mg/dL (ref <117)

## 2012-08-18 MED ORDER — ENSURE COMPLETE PO LIQD
237.0000 mL | Freq: Every day | ORAL | Status: DC
  Administered 2012-08-18: 237 mL via ORAL

## 2012-08-18 MED ORDER — BIOTENE DRY MOUTH MT LIQD
15.0000 mL | Freq: Two times a day (BID) | OROMUCOSAL | Status: DC
  Administered 2012-08-18 (×2): 15 mL via OROMUCOSAL

## 2012-08-18 MED ORDER — LEVALBUTEROL HCL 0.63 MG/3ML IN NEBU
0.6300 mg | INHALATION_SOLUTION | Freq: Three times a day (TID) | RESPIRATORY_TRACT | Status: DC
  Administered 2012-08-18 – 2012-08-19 (×2): 0.63 mg via RESPIRATORY_TRACT
  Filled 2012-08-18 (×5): qty 3

## 2012-08-18 MED ORDER — PANTOPRAZOLE SODIUM 40 MG PO TBEC
40.0000 mg | DELAYED_RELEASE_TABLET | Freq: Every day | ORAL | Status: DC
  Administered 2012-08-18 – 2012-08-19 (×2): 40 mg via ORAL
  Filled 2012-08-18 (×2): qty 1

## 2012-08-18 MED ORDER — IOHEXOL 350 MG/ML SOLN
100.0000 mL | Freq: Once | INTRAVENOUS | Status: AC | PRN
  Administered 2012-08-18: 100 mL via INTRAVENOUS

## 2012-08-18 MED ORDER — LEVOFLOXACIN 500 MG PO TABS
500.0000 mg | ORAL_TABLET | Freq: Every day | ORAL | Status: DC
  Administered 2012-08-18: 500 mg via ORAL
  Filled 2012-08-18 (×2): qty 1

## 2012-08-18 NOTE — H&P (Signed)
Patient seen, examined. Available data reviewed. Agree with findings, assessment, and plan as outlined by Ward Givens, NP. The patient was independently interviewed and examined. The cause of her acute dyspnea and tachycardia are not clear at this time. The patient is short of breath at rest and clearly needs hospital admission. The differential diagnosis includes anemia in the setting of change in her bowel habits, pulmonary embolus, and community-acquired pneumonia. She will be admitted to the hospital with evaluation as outlined above. The plan was discussed with the patient and her family member who accompanied her today.  Tonny Bollman, M.D. 08/18/2012 4:55 PM

## 2012-08-18 NOTE — Care Management Note (Unsigned)
    Page 1 of 1   08/18/2012     9:12:52 AM   CARE MANAGEMENT NOTE 08/18/2012  Patient:  [facility]   Account Number:  [id number]  Date Initiated:  08/18/2012  Documentation initiated by:  SIMMONS,Travor Royce  Subjective/Objective Assessment:   ADMITTED WITH DYSPNEA; LIVES AT HOME WITH HUSBAND- HANS AND GRANDKIDS; AMBULATES WITH CANE AT HOME; USES HOME O2 FROM [facility]- 5L ATC.     Action/Plan:   DISCHARGE PLANNING DISCUSSED AT BEDSIDE.   Anticipated DC Date:  08/20/2012   Anticipated DC Plan:  HOME W HOME HEALTH SERVICES      DC Planning Services  CM consult      Choice offered to / List presented to:             Status of service:  In process, will continue to follow Medicare Important Message given?   (If response is "NO", the following Medicare IM given date fields will be blank) Date Medicare IM given:   Date Additional Medicare IM given:    Discharge Disposition:    Per UR Regulation:  Reviewed for med. necessity/level of care/duration of stay  If discussed at Long Length of Stay Meetings, dates discussed:    Comments:  08/18/12  0849  Emmaus Brandi SIMMONS RN, BSN [phone] NCM WILL FOLLOW.

## 2012-08-18 NOTE — Progress Notes (Signed)
INITIAL ADULT NUTRITION ASSESSMENT Date: 08/18/2012   Time: 11:50 AM  INTERVENTION:  Strawberry Ensure Complete supplement daily (350 kcals, 13 gm protein per 8 fl oz bottle) RD to follow for nutrition care plan  Reason for Assessment: Malnutrition Screening Tool Report  ASSESSMENT: Female 77 y.o.  Dx: Dyspnea  Hx:  Past Medical History  Diagnosis Date  . RBBB   . HYPERTENSION   . DIASTOLIC HEART FAILURE, CHRONIC     a. 05/2008 Echo: EF 55-60%, Diast Dysfxn, mild-mod AI, triv MR  . AORTIC INSUFFICIENCY     a. mild-mod by echo 05/2008  . OSTEOPOROSIS   . DEPRESSION   . CHF (congestive heart failure)   . Heart murmur     "for years" (08/17/2012)  . Myocardial infarction ~ 2006    "mild"  . Anginal pain   . On home oxygen therapy   . Shortness of breath 08/17/2012    "all the time"  . OBSTRUCTIVE SLEEP APNEA     pt denies this hx (08/17/2012)  . Daily headache   . Arthritis     "knees" (08/17/2012)  . Anxiety     Related Meds:     . antiseptic oral rinse  15 mL Mouth Rinse BID  . pantoprazole  40 mg Oral Q0600    Ht: 5' (152.4 cm)  Wt: 146 lb 2.6 oz (66.3 kg)  Ideal Wt: 45.4 kg % Ideal Wt: 147%  Usual Wt: 77 kg -- October 2012 % Usual Wt: 86%  Body mass index is 28.55 kg/(m^2).  Food/Nutrition Related Hx: recent weight lost without trying and decreased appetite per admission nutrition screen  Labs:  CMP     Component Value Date/Time   NA 139 08/18/2012 0236   K 3.5 08/18/2012 0236   CL 102 08/18/2012 0236   CO2 27 08/18/2012 0236   GLUCOSE 95 08/18/2012 0236   BUN 14 08/18/2012 0236   CREATININE 0.83 08/18/2012 0236   CALCIUM 9.3 08/18/2012 0236   PROT 8.0 08/17/2012 1528   ALBUMIN 3.9 08/17/2012 1528   AST 18 08/17/2012 1528   ALT 7 08/17/2012 1528   ALKPHOS 93 08/17/2012 1528   BILITOT 0.3 08/17/2012 1528   GFRNONAA 67* 08/18/2012 0236   GFRAA 78* 08/18/2012 0236     Intake/Output Summary (Last 24 hours) at 08/18/12 1152 Last data filed at 08/18/12  0900  Gross per 24 hour  Intake 1106.67 ml  Output   1600 ml  Net -493.33 ml    Diet Order: Cardiac  Supplements/Tube Feeding: N/A  IVF:    DISCONTD: sodium chloride Last Rate: 50 mL/hr at 08/17/12 1740    Estimated Nutritional Needs:   Kcal: 1600-1800 Protein: 70-80 gm Fluid: 1.6-1.8 L  Patient presented with a two-month history of progressive dyspnea, weakness, chest pain, malaise and melena; reports she sometimes "doesn't feel like eating;" and she never can "finish" her meal; per flowsheet and office visit records, she's had some progressive weight loss over the last year (14%), however, not significant for time frame; she would benefit and is amenable to Ensure supplement daily -- RD to order.  NUTRITION DIAGNOSIS: -Inadequate oral intake (NI-2.1).  Status: Ongoing  RELATED TO: limited appetite  AS EVIDENCE BY: patient report  MONITORING/EVALUATION(Goals): Goal: Oral intake with meals & supplements to meet >/= 90% of estimated nutrition needs Monitor: PO & supplemental intake, weight, labs, I/O's  EDUCATION NEEDS: -No education needs identified at this time  Dietitian #: [phone]  DOCUMENTATION CODES Per approved criteria  -  Not Applicable    Alger Memos 08/18/2012, 11:50 AM

## 2012-08-18 NOTE — Progress Notes (Signed)
Patient Name: Lori [name] Date of Encounter: 08/18/2012   Principal Problem:  *Dyspnea Active Problems:  DEPRESSION  OBSTRUCTIVE SLEEP APNEA  HYPERTENSION  AORTIC INSUFFICIENCY  RBBB  DIASTOLIC HEART FAILURE, CHRONIC  Pre-syncope  Melena  Noncompliance  Anemia   SUBJECTIVE  Overall feels better this AM.  Breathing is improved on O2.  No chest pain, though had about 2 hrs of left arm heaviness this AM.  Orthostatic bp's last night did not show drop in bp.  Pressures improved with IVF overnight.  HR down into 90's now.  CURRENT MEDS    . antiseptic oral rinse  15 mL Mouth Rinse BID    OBJECTIVE  Filed Vitals:   08/17/12 1845 08/17/12 1848 08/17/12 2006 08/18/12 0620  BP: 123/62 134/66 99/61 136/58  Pulse: 125 124 102 89  Temp:   97.6 F (36.4 C) 98.1 F (36.7 C)  TempSrc:   Oral Oral  Resp: 20 20 28 18   Height:      Weight:    146 lb 2.6 oz (66.3 kg)  SpO2:   99% 100%    Intake/Output Summary (Last 24 hours) at 08/18/12 0821 Last data filed at 08/18/12 0700  Gross per 24 hour  Intake 866.67 ml  Output    600 ml  Net 266.67 ml   Filed Weights   08/17/12 1450 08/18/12 0620  Weight: 144 lb 6.4 oz (65.5 kg) 146 lb 2.6 oz (66.3 kg)   PHYSICAL EXAM  General: Pleasant, NAD. Weight 146 lbs (up 2 lbs since yesterday but down from 170 lbs 08/2011) Neuro: Alert and oriented X 3. Moves all extremities spontaneously. Psych: Normal affect. HEENT:  Normal  Neck: Supple without bruits or JVD. Lungs:  Resp mild labored with speech - improved from yesterday, scatt rhonchi, basilar crackles, inspiratory wheeze throughout w/ occasional exp wheeze.  Heart: RRR no s3, s4, systolic murmer left upper sternal border.  Abdomen: Soft, non-tender, non-distended, BS + x 4.  Extremities: No clubbing, cyanosis or edema. DP/PT/Radials 2+ and equal bilaterally.  Accessory Clinical Findings  CBC  Basename 08/18/12 0236 08/17/12 1528  WBC 11.8* 16.4*  NEUTROABS -- 11.4*  HGB  10.0* 11.3*  HCT 29.9* 33.3*  MCV 87.7 88.1  PLT 299 376   Basic Metabolic Panel  Basename 08/18/12 0236 08/17/12 1528  NA 139 139  K 3.5 3.3*  CL 102 100  CO2 27 28  GLUCOSE 95 111*  BUN 14 20  CREATININE 0.83 0.99  CALCIUM 9.3 10.3  MG -- 2.0  PHOS -- --   Liver Function Tests  Basename 08/17/12 1528  AST 18  ALT 7  ALKPHOS 93  BILITOT 0.3  PROT 8.0  ALBUMIN 3.9   Cardiac Enzymes  Basename 08/18/12 0235 08/17/12 2011 08/17/12 1529  CKTOTAL 82 87 97  CKMB 1.7 1.8 1.9  CKMBINDEX -- -- --  TROPONINI <0.30 <0.30 <0.30   D-Dimer  Basename 08/17/12 1528  DDIMER 0.59*   Hemoglobin A1C  Basename 08/17/12 1528  HGBA1C 5.4   Fasting Lipid Panel  Basename 08/18/12 0236  CHOL 129  HDL 41  LDLCALC 66  TRIG 110  CHOLHDL 3.1  LDLDIRECT --   Thyroid Function Tests  Basename 08/17/12 1528  TSH 1.682  T4TOTAL --  T3FREE --  THYROIDAB --   pbnp 117  TELE  Rsr, st  ECG  RSR, RBBB. Rate 94. No Actue ST/T changes.  Radiology/Studies  Dg Chest Port 1 View  08/17/2012  *RADIOLOGY REPORT*  Clinical  Data: Question pneumonia.  PORTABLE CHEST - 1 VIEW  Comparison: 06/16/2008  Findings: Patchy bilateral airspace disease again noted, improved since prior study, but persistent in the right upper lobe and both lung bases and lingula.  Some of these areas may represents scarring or chronic changes.  Heart is borderline in size.  No effusions or acute bony abnormality.  IMPRESSION: Patchy bilateral opacities improved but persist as described above. Question areas of scarring.   Original Report Authenticated By: Cyndie Chime, M.D.   2D Echo - Study Conclusions 08/17/2012  - Left ventricle: The cavity size was normal. Wall thickness was normal. Systolic function was normal. The estimated ejection fraction was in the range of 60% to 65%. Wall motion was normal; there were no regional wall motion abnormalities. Doppler parameters are consistent with abnormal left  ventricular relaxation (grade 1 diastolic dysfunction). - Aortic valve: Mild regurgitation. - Mitral valve: Calcified annulus. Right ventricle: The cavity size was normal. Systolic function was normal. ------------------------------------------------------------ Pulmonic valve: Doppler: Transvalvular velocity was within the normal range. There was no evidence for stenosis. ------------------------------------------------------------ Tricuspid valve: Structurally normal valve. Doppler: Transvalvular velocity was within the normal range. Trivial regurgitation.  ASSESSMENT AND PLAN  1.  Dyspnea/Fatigue:  Likely multifactorial, though source remains unclear.  She is anemic but Ss are out of proportion to anemia.  pBNP is normal and she does not have significant volume overload on exam.  That said, she does have basilar crackles this AM and I will d/c IVF.  WBC elevated though coming down.  She has been afebrile.  CXR w/o evidence of pna.  Check UA.  ? Role of depression/anxiety in setting of difficult home-life over past few months.  TSH nl.  2.  Anemia/Melena:  No stools here.  Guaiac stool.  Mildly anemic - drop from yesterday may be r/t IVF/blood draws.  Cont to follow.  Admission H/H in-line with prior recorded H/H's.  Start PPI.    3.  Chronic Diast CHF: Euvolemic on exam. 2D echo reveals grade 1 diastolic dysfuction.  EF 60-65%, normal wall motion and mild AI. Net fluid 266.6 ml since admission will continue to monitor volume closely. Stop continuous IV infusion at this time in attempt to prevent fluid overload.   4.  Midsternal Chest Pain: No chest pain since admission, CE normal. Describes Left arm heaviness since this AM has had ECG shortly after time of onset with no acute ST/T changes. D-Dimer mildly elevated, echo reveals no findings consistent with PE (nl RV fxn/size). Given that intermittent chest discomfort has been persistent for 2 months as recently as yesterday with no elevation  in cardiac enzymes yields no objective evidence of pain related to ischemia.  Will discuss pursuit of CTA to r/o PE with Dr. Excell Seltzer.  5.  Leukocytosis:  ? Etiology.  Check UA.  Afebrile.  Signed, Nicolasa Ducking NP  Patient seen, examined. Available data reviewed. Agree with findings, assessment, and plan as outlined by Ward Givens, NP. The patient is clinically improved today. We do note that she has a leukocytosis and patchy infiltrate on chest x-ray, clinical scenario consistent with community-acquired pneumonia. I would recommend an empiric course of antibiotics. Will treat her with Levaquin. In addition, her d-dimer is elevated and she has been noted to be tachycardic. Will check a CT angiography chest to rule out pulmonary embolus since her clinical scenario is not entirely clear.  Tonny Bollman, M.D. 08/18/2012 2:18 PM

## 2012-08-19 ENCOUNTER — Encounter (HOSPITAL_COMMUNITY): Payer: Self-pay | Admitting: Nurse Practitioner

## 2012-08-19 DIAGNOSIS — I359 Nonrheumatic aortic valve disorder, unspecified: Secondary | ICD-10-CM

## 2012-08-19 DIAGNOSIS — R918 Other nonspecific abnormal finding of lung field: Secondary | ICD-10-CM

## 2012-08-19 LAB — CBC
MCH: 29.3 pg (ref 26.0–34.0)
MCV: 88.8 fL (ref 78.0–100.0)
Platelets: 321 10*3/uL (ref 150–400)
RDW: 13.6 % (ref 11.5–15.5)

## 2012-08-19 LAB — URINE CULTURE: Colony Count: NO GROWTH

## 2012-08-19 MED ORDER — ASPIRIN 81 MG PO TABS: 81.0000 mg | ORAL_TABLET | Freq: Every day | ORAL | Status: DC

## 2012-08-19 MED ORDER — NITROGLYCERIN 0.4 MG SL SUBL: 0.4000 mg | SUBLINGUAL_TABLET | SUBLINGUAL | Status: DC | PRN

## 2012-08-19 MED ORDER — METOPROLOL TARTRATE 50 MG PO TABS: 50.0000 mg | ORAL_TABLET | Freq: Two times a day (BID) | ORAL | Status: DC

## 2012-08-19 MED ORDER — PANTOPRAZOLE SODIUM 40 MG PO TBEC: 40.0000 mg | DELAYED_RELEASE_TABLET | Freq: Every day | ORAL | Status: DC

## 2012-08-19 MED ORDER — LEVALBUTEROL TARTRATE 45 MCG/ACT IN AERO: 1.0000 | INHALATION_SPRAY | RESPIRATORY_TRACT | Status: DC | PRN

## 2012-08-19 MED ORDER — LEVOFLOXACIN 500 MG PO TABS: 500.0000 mg | ORAL_TABLET | Freq: Every day | ORAL | Status: DC

## 2012-08-19 NOTE — Discharge Summary (Signed)
Patient ID: Lori [name],  MRN: [id number], DOB/AGE: Sep 05, 1935 77 y.o.  Admit date: 08/17/2012 Discharge date: 08/19/2012  Primary Care Provider: Leanor Rubenstein Primary Cardiologist: T. Aaiden Depoy, MD  Discharge Diagnoses Principal Problem:  *Dyspnea with lung infiltrate  **Likely multifactorial in setting of chronic, severe lung dzs and possible pna.  **10 day course of levaquin initiated this admission and xopenex added.  **On home O2  **Pulmonary f/u arranged.  Active Problems:  DEPRESSION  Noncompliance  **Out of meds for 3 months.  HYPERTENSION  DIASTOLIC HEART FAILURE, CHRONIC  **Normal LV fxn with Grade 1 Diastolic Dysfunction by echo this admission.  Pre-syncope  **Not found to be orthostatic.  OBSTRUCTIVE SLEEP APNEA  AORTIC INSUFFICIENCY  **Mild by echo this admission  RBBB  Melena  **2 wk h/o dark stools but FOB negative this admission.  Anemia  Allergies No Known Allergies  Procedures  2D Echocardiogram 08/17/2012  Study Conclusions  - Left ventricle: The cavity size was normal. Oshea Percival thickness   was normal. Systolic function was normal. The estimated   ejection fraction was in the range of 60% to 65%. Alonia Dibuono   motion was normal; there were no regional Detavious Rinn motion   abnormalities. Doppler parameters are consistent with   abnormal left ventricular relaxation (grade 1 diastolic   dysfunction). - Aortic valve: Mild regurgitation. - Mitral valve: Calcified annulus. _____________  CTA of the Chest with Contrast 08/18/2012  IMPRESSION: 1. No evidence of acute pulmonary embolus. 2.  Chronic severe lung disease very similar appearance to the 2009 CT. 3.  Increased mediastinal soft tissue at the AP window and right paratracheal nodal station, but the discernible individual nodes in these areas appear stable in size.  Legrand Rams this is related to chronic/recurrent lung inflammation rather than a malignancy or lymphoproliferative disorder. _____________  History of  Present Illness  77 year old female with the above problems. She was last seen in clinic approximately one year ago. Patient has had a difficult year and over the past few months has been the primary caregiver for her family and extended family.  Since June of this year, she has been feeling "lousy" with progressive malaise, dyspnea on exertion, weakness, intermittent chest pressure, and lightheadedness.  She ran out of all of her medicines at least 3 months ago and has been wearing her O2 24/7.  She was seen in cardiology clinic on 9/24 due to a 2 week h/o rapid progression of the above symptoms and also complaints of dark stools.  She was having dizziness, unsteadiness, and chest pain in the office and her blood pressure was in the low-90's with HR's in the 1-teens (sinus tachycardia).  She was admitted for further evaluation.  Hospital Course  Pt ruled out for MI despite more than 8hrs of ongoing chest pain complaints.  She was gently hydrated with improved blood pressures and heart rates.  CXR showed patchy bilateral opacities that were felt to be improved compared to 05/2008 cxr.  She had significant leukocytosis on admission with a WBC count of 16.4 and as a result oral levaquin therapy was started.  She was afebrile.  Pro-BNP was normal and there was no suggestion of CHF on exam.  With her report of dark stools and symptoms of orthostasis, we were initially concerned about the possibility of GI bleeding.  Admission H/H were 11.3/33.3 respectively, which was not dramatically different from prior measures.  With hydration and blood draws, H/H did drift down some, however her stool was found to be guaiac negative and  there was no other evidence of bleeding.   She remained dyspneic and tacycardic on 9/25 and d-dimer was mildly elevated @ 0.59.  CTA of the chest with contrast was performed on 9/25 and showed no evidence of PE.  As noted in full report above, changes consistent with chronic, severe lung  disease were noted.  We initiated xopenex therapy as pt did have scatt rhonchi and inspiratory wheezing on exam.  With medication additions, and IVF, pt has had significant clinical improvement.  BP has normalized however HR remains elevated @ rest.  She is felt to be stable for discharge home today.  We have arranged for f/u in our office next week at which point we will repeat a CBC.  We have also arranged for f/u with pulmonology in mid-October.  If dyspnea does not further improve after completion of antibiotic therapy, we will arrange for outpatient PFT's prior to her pulmonary follow-up.  Though she was prescribed (but was not taking) multiple medications prior to admission, we have opted to resume only her beta blocker at this time - and at 1/2 of previously prescribed dose.  If her BP is elevated upon f/u, we will consider additional titration of bb therapy vs resumption of prior antihypertensives (amlodipine & lisinopril).  Discharge Vitals Blood pressure 130/66, pulse 100, temperature 98.5 F (36.9 C), temperature source Oral, resp. rate 18, height 5' (1.524 m), weight 144 lb 13.5 oz (65.7 kg), SpO2 96.00%.  Filed Weights   08/17/12 1450 08/18/12 0620 08/19/12 0415  Weight: 144 lb 6.4 oz (65.5 kg) 146 lb 2.6 oz (66.3 kg) 144 lb 13.5 oz (65.7 kg)   Labs  CBC  Basename 08/19/12 0650 08/18/12 0236 08/17/12 1528  WBC 11.7* 11.8* --  NEUTROABS -- -- 11.4*  HGB 9.7* 10.0* --  HCT 29.4* 29.9* --  MCV 88.8 87.7 --  PLT 321 299 --   Basic Metabolic Panel  Basename 08/18/12 0236 08/17/12 1528  NA 139 139  K 3.5 3.3*  CL 102 100  CO2 27 28  GLUCOSE 95 111*  BUN 14 20  CREATININE 0.83 0.99  CALCIUM 9.3 10.3  MG -- 2.0  PHOS -- --   Liver Function Tests  Basename 08/17/12 1528  AST 18  ALT 7  ALKPHOS 93  BILITOT 0.3  PROT 8.0  ALBUMIN 3.9   Cardiac Enzymes  Basename 08/18/12 0235 08/17/12 2011 08/17/12 1529  CKTOTAL 82 87 97  CKMB 1.7 1.8 1.9  CKMBINDEX -- -- --    TROPONINI <0.30 <0.30 <0.30   pBNP  117.6  D-Dimer  Basename 08/17/12 1528  DDIMER 0.59*   Hemoglobin A1C  Basename 08/17/12 1528  HGBA1C 5.4   Fasting Lipid Panel  Basename 08/18/12 0236  CHOL 129  HDL 41  LDLCALC 66  TRIG 110  CHOLHDL 3.1  LDLDIRECT --   Thyroid Function Tests  Basename 08/17/12 1528  TSH 1.682  T4TOTAL --  T3FREE --  THYROIDAB --   Disposition  Pt is being discharged home today in good condition.  Follow-up Plans & Appointments  Follow-up Information    Follow up with Tereso Newcomer, PA. On 08/26/2012. (See for Dr Daleen Squibb at 11:30 am)    Contact information:   [address] [place] [address] [phone]       Follow up with Sandrea Hughs, MD. On 09/07/2012. (9:45 AM - [facility])    Contact information:   [address]  [place] [address] [phone]        Discharge Medications    Medication List     As of 08/19/2012 11:42 AM    STOP taking these medications         amLODipine 10 MG tablet   Commonly known as: NORVASC      furosemide 40 MG tablet   Commonly known as: LASIX      lisinopril 20 MG tablet   Commonly known as: PRINIVIL,ZESTRIL      potassium chloride SA 20 MEQ tablet   Commonly known as: K-DUR,KLOR-CON      vitamin E 400 UNIT capsule      TAKE these medications         aspirin 81 MG tablet   Take 1 tablet (81 mg total) by mouth daily.      cholecalciferol 400 UNITS Tabs   Commonly known as: VITAMIN D   Take 400 Units by mouth daily.      levalbuterol 45 MCG/ACT inhaler   Commonly known as: XOPENEX HFA   Inhale 1-2 puffs into the lungs every 4 (four) hours as needed for wheezing.      levofloxacin 500 MG tablet   Commonly known as: LEVAQUIN   Take 1 tablet (500 mg total) by mouth daily at 6 PM.      metoprolol 50 MG tablet   Commonly known as: LOPRESSOR   Take 1 tablet (50 mg total) by mouth 2 (two) times daily.      nitroGLYCERIN  0.4 MG SL tablet   Commonly known as: NITROSTAT   Place 1 tablet (0.4 mg total) under the tongue every 5 (five) minutes as needed. For chest pain      pantoprazole 40 MG tablet   Commonly known as: PROTONIX   Take 1 tablet (40 mg total) by mouth daily at 6 (six) AM.      vitamin C 1000 MG tablet   Take 1,000 mg by mouth daily.      Outstanding Labs/Studies  CBC @ f/u appt next week.  Duration of Discharge Encounter   Greater than 30 minutes including physician time.  Signed, Nicolasa Ducking NP 08/19/2012, 11:42 AM   Jesse Sans. Daleen Squibb, MD, [facility] Pager:  [phone]

## 2012-08-19 NOTE — Progress Notes (Signed)
Patient ID: Lori [name], female   DOB: [birth year], 77 y.o.   MRN: [id number]   Patient Name: Lori [name] Date of Encounter: 08/19/2012    SUBJECTIVE Less short of  breath this morning. Walking in hallways. She wears oxygen at home. She has been afebrile her white cell count is dropping. CT angiogram negative for pulmonary embolus  CURRENT MEDS    . antiseptic oral rinse  15 mL Mouth Rinse BID  . feeding supplement  237 mL Oral Q1500  . levalbuterol  0.63 mg Nebulization TID  . levofloxacin  500 mg Oral q1800  . pantoprazole  40 mg Oral Q0600    OBJECTIVE  Filed Vitals:   08/18/12 1531 08/18/12 2107 08/18/12 2156 08/19/12 0415  BP: 151/58  148/70 130/66  Pulse: 130  122 100  Temp:   97.3 F (36.3 C) 98.5 F (36.9 C)  TempSrc:   Oral Oral  Resp:   18 18  Height:      Weight:    144 lb 13.5 oz (65.7 kg)  SpO2:  99% 100% 99%    Intake/Output Summary (Last 24 hours) at 08/19/12 0838 Last data filed at 08/18/12 2253  Gross per 24 hour  Intake    480 ml  Output   1700 ml  Net  -1220 ml   Filed Weights   08/17/12 1450 08/18/12 0620 08/19/12 0415  Weight: 144 lb 6.4 oz (65.5 kg) 146 lb 2.6 oz (66.3 kg) 144 lb 13.5 oz (65.7 kg)    PHYSICAL EXAM  General: Pleasant, NAD. Neuro: Alert and oriented X 3. Moves all extremities spontaneously. Psych: Normal affect. HEENT:  Normal  Neck: Supple without bruits or JVD. Lungs: Crackles at Heart: RRR no s3, s4, or murmurs. Abdomen: Soft, non-tender, non-distended, BS + x 4.  Extremities: No clubbing, cyanosis or edema. DP/PT/Radials 2+ and equal bilaterally.  Accessory Clinical Findings  CBC  Basename 08/19/12 0650 08/18/12 0236 08/17/12 1528  WBC 11.7* 11.8* --  NEUTROABS -- -- 11.4*  HGB 9.7* 10.0* --  HCT 29.4* 29.9* --  MCV 88.8 87.7 --  PLT 321 299 --   Basic Metabolic Panel  Basename 08/18/12 0236 08/17/12 1528  NA 139 139  K 3.5 3.3*  CL 102 100  CO2 27 28  GLUCOSE 95 111*  BUN 14 20  CREATININE  0.83 0.99  CALCIUM 9.3 10.3  MG -- 2.0  PHOS -- --   Liver Function Tests  Basename 08/17/12 1528  AST 18  ALT 7  ALKPHOS 93  BILITOT 0.3  PROT 8.0  ALBUMIN 3.9   No results found for this basename: LIPASE:2,AMYLASE:2 in the last 72 hours Cardiac Enzymes  Basename 08/18/12 0235 08/17/12 2011 08/17/12 1529  CKTOTAL 82 87 97  CKMB 1.7 1.8 1.9  CKMBINDEX -- -- --  TROPONINI <0.30 <0.30 <0.30   BNP No components found with this basename: POCBNP:3 D-Dimer  Basename 08/17/12 1528  DDIMER 0.59*   Hemoglobin A1C  Basename 08/17/12 1528  HGBA1C 5.4   Fasting Lipid Panel  Basename 08/18/12 0236  CHOL 129  HDL 41  LDLCALC 66  TRIG 110  CHOLHDL 3.1  LDLDIRECT --   Thyroid Function Tests  Basename 08/17/12 1528  TSH 1.682  T4TOTAL --  T3FREE --  THYROIDAB --    TEL NSR ECG    Radiology/Studies  Ct Angio Chest Pe W/cm &/or Wo Cm  08/18/2012  *RADIOLOGY REPORT*  Clinical Data: 77 year old female with shortness of breath, chest pain, tachycardia.  CT ANGIOGRAPHY CHEST  Technique:  Multidetector CT imaging of the chest using the standard protocol during bolus administration of intravenous contrast. Multiplanar reconstructed images including MIPs were obtained and reviewed to evaluate the vascular anatomy.  Contrast: OMNIPAQUE IOHEXOL 350 MG/ML SOLN  Comparison: Chest CT 06/14/2008.  Findings: Good contrast bolus timing in the pulmonary arterial tree.  No focal filling defect identified in the pulmonary arterial tree to suggest the presence of acute pulmonary embolism.  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  Stable visualized great vessels.  Stable visualized aorta. Chronic cardiomegaly.  No pericardial effusion.  Increased mediastinal soft tissue above the level of the carina and at the AP window (series 4 image 28).  The visible individual nodes in these areas appear fairly stable by contrast.  Major airways are patent.  Chronic  severe lung disease in the form of scattered bilateral confluent ground-glass opacity, peripheral reticular opacity, and peribronchial thickening.  The pattern of disease is very similar to that in 2009.  Some areas show improved ventilation today (superior segment right lower lobe).  No consolidation.  No pleural effusion.  Stable and negative visualized upper abdominal viscera.  Degenerative changes in the spine with chronic flowing thoracic osteophytes. No acute osseous abnormality identified.  IMPRESSION: 1. No evidence of acute pulmonary embolus. 2.  Chronic severe lung disease very similar appearance to the 2009 CT. 3.  Increased mediastinal soft tissue at the AP window and right paratracheal nodal station, but the discernible individual nodes in these areas appear stable in size.  Legrand Rams this is related to chronic/recurrent lung inflammation rather than a malignancy or lymphoproliferative disorder.   Original Report Authenticated By: Harley Hallmark, M.D.    Dg Chest Port 1 View  08/17/2012  *RADIOLOGY REPORT*  Clinical Data: Question pneumonia.  PORTABLE CHEST - 1 VIEW  Comparison: 06/16/2008  Findings: Patchy bilateral airspace disease again noted, improved since prior study, but persistent in the right upper lobe and both lung bases and lingula.  Some of these areas may represents scarring or chronic changes.  Heart is borderline in size.  No effusions or acute bony abnormality.  IMPRESSION: Patchy bilateral opacities improved but persist as described above. Question areas of scarring.   Original Report Authenticated By: Cyndie Chime, M.D.     ASSESSMENT AND PLAN  Principal Problem:  *Dyspnea Active Problems:  DEPRESSION  OBSTRUCTIVE SLEEP APNEA  HYPERTENSION  AORTIC INSUFFICIENCY  RBBB  DIASTOLIC HEART FAILURE, CHRONIC  Pre-syncope  Melena  Noncompliance  Anemia    She feels better and feels like she can get home. White count is dropping and she is afebrile. We'll continue Levaquin  for 10 days. Resume home meds for other chronic disease management. Is probably a little bit volume depleted on admission hands hemoglobin has dropped from dilution. We'll schedule followup early next week in our office with a CBC. If clinically better and improving, no need to repeat chest x-ray. It will take several weeks for her infiltrates to resolve.  Signed, Valera Castle MD

## 2012-08-20 ENCOUNTER — Telehealth: Payer: Self-pay | Admitting: Cardiology

## 2012-08-20 MED ORDER — ALPRAZOLAM 0.25 MG PO TABS: 0.2500 mg | ORAL_TABLET | Freq: Three times a day (TID) | ORAL | Status: DC | PRN

## 2012-08-20 NOTE — Telephone Encounter (Signed)
Daughter is calling today because "Dr. Daleen Squibb told pt when she was discharged yesterday that he would prescribe her something for anxiety but she did not receive a prescription".   She reports her mother is very anxious and unable to sleep even with her oxygen.  Her mother had taken ativan 1mg  in the past but her prescription ran out.

## 2012-08-20 NOTE — Telephone Encounter (Signed)
After speaking with Dr. Daleen Squibb he did want pt to have a prescription for Xanax 0.25mg  1 q8h prn anxiety. I have called this in to [facility]. Daughter, Efraim Kaufmann, is aware it will be available for pick up today.  Mylo Red RN

## 2012-08-20 NOTE — Telephone Encounter (Signed)
New Problem:    Patient's daughter-in-law called in wanting to know what the medication was that Dr. Daleen Squibb prescribed for her mother yesterday, when her saw her in the hospital, for her anxiety.  Please call back.

## 2012-08-26 ENCOUNTER — Encounter: Payer: Medicare Other | Admitting: Physician Assistant

## 2012-08-27 ENCOUNTER — Encounter: Payer: Self-pay | Admitting: Nurse Practitioner

## 2012-08-27 ENCOUNTER — Ambulatory Visit (INDEPENDENT_AMBULATORY_CARE_PROVIDER_SITE_OTHER): Payer: Medicare Other | Admitting: Nurse Practitioner

## 2012-08-27 VITALS — BP 124/58 | HR 88 | Ht 60.0 in | Wt 156.4 lb

## 2012-08-27 DIAGNOSIS — R0609 Other forms of dyspnea: Secondary | ICD-10-CM

## 2012-08-27 DIAGNOSIS — R0600 Dyspnea, unspecified: Secondary | ICD-10-CM

## 2012-08-27 LAB — BASIC METABOLIC PANEL
BUN: 10 mg/dL (ref 6–23)
CO2: 26 mEq/L (ref 19–32)
Calcium: 8.8 mg/dL (ref 8.4–10.5)
Chloride: 107 mEq/L (ref 96–112)
Creatinine, Ser: 1 mg/dL (ref 0.4–1.2)
GFR: 54.82 mL/min — ABNORMAL LOW (ref >60.00)
Glucose, Bld: 93 mg/dL (ref 70–99)
Potassium: 4.1 mEq/L (ref 3.5–5.1)
Sodium: 138 mEq/L (ref 135–145)

## 2012-08-27 LAB — CBC WITH DIFFERENTIAL/PLATELET
Basophils Absolute: 0.1 10*3/uL (ref 0.0–0.1)
Basophils Relative: 0.7 % (ref 0.0–3.0)
Eosinophils Absolute: 0.5 10*3/uL (ref 0.0–0.7)
Eosinophils Relative: 4.9 % (ref 0.0–5.0)
HCT: 31.7 % — ABNORMAL LOW (ref 36.0–46.0)
Hemoglobin: 10.2 g/dL — ABNORMAL LOW (ref 12.0–15.0)
Lymphocytes Relative: 20.8 % (ref 12.0–46.0)
Lymphs Abs: 2.3 10*3/uL (ref 0.7–4.0)
MCHC: 32.2 g/dL (ref 30.0–36.0)
MCV: 92.5 fl (ref 78.0–100.0)
Monocytes Absolute: 0.7 10*3/uL (ref 0.1–1.0)
Monocytes Relative: 6.2 % (ref 3.0–12.0)
Neutro Abs: 7.5 10*3/uL (ref 1.4–7.7)
Neutrophils Relative %: 67.4 % (ref 43.0–77.0)
Platelets: 302 10*3/uL (ref 150.0–400.0)
RBC: 3.43 Mil/uL — ABNORMAL LOW (ref 3.87–5.11)
RDW: 14.8 % — ABNORMAL HIGH (ref 11.5–14.6)
WBC: 11.1 10*3/uL — ABNORMAL HIGH (ref 4.5–10.5)

## 2012-08-27 LAB — BRAIN NATRIURETIC PEPTIDE: Pro B Natriuretic peptide (BNP): 201 pg/mL — ABNORMAL HIGH (ref 0.0–100.0)

## 2012-08-27 MED ORDER — POTASSIUM CHLORIDE CRYS ER 20 MEQ PO TBCR: 20.0000 meq | EXTENDED_RELEASE_TABLET | Freq: Every day | ORAL | Status: DC

## 2012-08-27 MED ORDER — FUROSEMIDE 40 MG PO TABS: 40.0000 mg | ORAL_TABLET | Freq: Every day | ORAL | Status: DC

## 2012-08-27 NOTE — Patient Instructions (Addendum)
We will restart your Lasix at 40 mg a day and your potassium at 20 meq per day  We need to check labs today  We need to see you in a month  Keep using your oxygen. We are going to call [facility] about some portable oxygen.  See pulmonary as planned.  Call the [facility] office at [phone] if you have any questions, problems or concerns.

## 2012-08-27 NOTE — Progress Notes (Addendum)
[name] Date of Birth: [birth year] Medical Record #[id number]  History of Present Illness: Ms. Udovich is seen back today for a post hospital visit. She is seen for Dr. Daleen Squibb. She had previously not been seen in over a year due to being the caregiver for her family/extended family. She has had recent admission for dyspnea with lung infiltrate. This was felt to be multifactorial in the setting of chronic, severe lung disease and possible PNA. Her proBNP was normal and this was not felt to be heart failure. She was treated with Levaquin and Xopenex. Sent home on oxygen therapy with pulmonary follow up. Her other issues include depression, noncompliance (out of medicines for 3 months), HTN, chronic grade 1 diastolic dysfunction, OSA, mild AI, RBBB, anemia and history of prior melena with negative FOB.   She comes in today. She is here with her husband. Only her beta blocker was resumed during this last admission despite previously being on multiple medicines (previously on Norvasc, lasix, ACE). She is still short of breath but thinks it is better. She comes to the office and left her oxygen at home. Oxygen sat is 89%. Says she is on 5 liters at home. Does not have portable oxygen at home as well. She is suppose to be on her oxygen 24/7. Needs a CBC rechecked today. Her weight is up. She has more edema. No chest pain. She is moving around more. Wants to take a double dose of her Xanax which I tried to discourage. Overall, she thinks she is a little better, still weak, but slowly making progress.   Current Outpatient Prescriptions on File Prior to Visit  Medication Sig Dispense Refill  . Ascorbic Acid (VITAMIN C) 1000 MG tablet Take 1,000 mg by mouth daily.       Marland Kitchen aspirin 81 MG tablet Take 1 tablet (81 mg total) by mouth daily.      Marland Kitchen levalbuterol (XOPENEX HFA) 45 MCG/ACT inhaler Inhale 1-2 puffs into the lungs every 4 (four) hours as needed for wheezing.  1 Inhaler  12  . levofloxacin (LEVAQUIN)  500 MG tablet Take 1 tablet (500 mg total) by mouth daily at 6 PM.  8 tablet  0  . metoprolol (LOPRESSOR) 50 MG tablet Take 1 tablet (50 mg total) by mouth 2 (two) times daily.  60 tablet  6  . nitroGLYCERIN (NITROSTAT) 0.4 MG SL tablet Place 1 tablet (0.4 mg total) under the tongue every 5 (five) minutes as needed. For chest pain  25 tablet  3  . pantoprazole (PROTONIX) 40 MG tablet Take 1 tablet (40 mg total) by mouth daily at 6 (six) AM.  30 tablet  6    No Known Allergies  Past Medical History  Diagnosis Date  . RBBB   . HYPERTENSION   . DIASTOLIC HEART FAILURE, CHRONIC     a. 05/2008 Echo: EF 55-60%, Diast Dysfxn, mild-mod AI, triv MR;  b. 07/2012 Echo: EF 60-65%, Gr 1 DD, Mid AI.  Marland Kitchen AORTIC INSUFFICIENCY     a. mild-mod by echo 05/2008;  b. mild by echo 07/2012  . OSTEOPOROSIS   . DEPRESSION   . Chronic lung disease     a. on home O2;  b. 07/2012 CTA chest: chronic sev lung dzs, similar to 2009 CT, stable R paratracheal nodes.  . OBSTRUCTIVE SLEEP APNEA     pt denies this hx (08/17/2012) - though previously seen by Dr. Shelle Iron for this Dx.  . Daily headache   . Arthritis     "  knees" (08/17/2012)  . Anxiety   . Depression     Past Surgical History  Procedure Date  . Rhinoplasty 1953    "broke it"    History  Smoking status  . Never Smoker   Smokeless tobacco  . Never Used    History  Alcohol Use  . Yes    08/17/2012 "rarely; on occasions"    Family History  Problem Relation Age of Onset  . Heart disease Sister   . Cancer Father     died in his 43's  . Other Mother     died @ 22 - "old age"  . Stroke Sister     deceased  . Cancer Brother     deceased    Review of Systems: The review of systems is per the HPI.  All other systems were reviewed and are negative.  Physical Exam: BP 124/58  Pulse 88  Ht 5' (1.524 m)  Wt 156 lb 6.4 oz (70.943 kg)  BMI 30.54 kg/m2  SpO2 89% Her weight is up. She was placed on our oxygen here in the office at 2l.  Patient is  in no acute distress. She is alert. She appears chronically ill. Skin is warm and dry. Color is normal.  HEENT is unremarkable. Normocephalic/atraumatic. PERRL. Sclera are nonicteric. Neck is supple. No masses. No JVD. Lungs are clear. Cardiac exam shows a regular rate and rhythm. Heart rate was about 100 on arrival but down in the 80's by me. Abdomen is soft. Extremities are with 1 to 2+ edema. Gait and ROM are intact. No gross neurologic deficits noted.  LABORATORY DATA: PENDING FOR TODAY.  Echo Study Conclusions Sept 2013  - Left ventricle: The cavity size was normal. Wall thickness was normal. Systolic function was normal. The estimated ejection fraction was in the range of 60% to 65%. Wall motion was normal; there were no regional wall motion abnormalities. Doppler parameters are consistent with abnormal left ventricular relaxation (grade 1 diastolic dysfunction). - Aortic valve: Mild regurgitation. - Mitral valve: Calcified annulus.  Lab Results  Component Value Date   WBC 11.7* 08/19/2012   HGB 9.7* 08/19/2012   HCT 29.4* 08/19/2012   PLT 321 08/19/2012   GLUCOSE 95 08/18/2012   CHOL 129 08/18/2012   TRIG 110 08/18/2012   HDL 41 08/18/2012   LDLCALC 66 08/18/2012   ALT 7 08/17/2012   AST 18 08/17/2012   NA 139 08/18/2012   K 3.5 08/18/2012   CL 102 08/18/2012   CREATININE 0.83 08/18/2012   BUN 14 08/18/2012   CO2 27 08/18/2012   TSH 1.682 08/17/2012   INR 1.06 08/17/2012   HGBA1C 5.4 08/17/2012   CT CHEST IMPRESSION:  1. No evidence of acute pulmonary embolus. 2. Chronic severe lung disease very similar appearance to the 2009 CT. 3. Increased mediastinal soft tissue at the AP window and right paratracheal nodal station, but the discernible individual nodes in these areas appear stable in size. Legrand Rams this is related to chronic/recurrent lung inflammation rather than a malignancy or lymphoproliferative disorder.   Original Report Authenticated By: Harley Hallmark,  M.D.    Assessment / Plan:  1. Dyspnea - felt to be multifactorial. CT with severe lung disease. She will be finishing her Levaquin tomorrow. Weight is up. She has more swelling on exam. I have restarted her Lasix and potassium. Recheck her labs today. She is to see pulmonary later this month. I encouraged her to stay on her oxygen 24/7. Will ask  Dr. Vern Claude nurse to arrange for portable oxygen. We will see her back in a month.  2. Diastolic heart failure - Weight is up. I have restarted diuretics.   3. Anemia - will recheck labs today.   Patient is agreeable to this plan and will call if any problems develop in the interim.   AddendumPeri Jefferson morning Keean Wilmeth. I wanted to give you an update on this patient's portable O2 referral: At this time, the pt has refused a portable system and states that she is perfectly happy with just her concentrator. Please let me know if I can be of further assistance and have a great day.   Henderson Newcomer [facility] [phone]

## 2012-09-07 ENCOUNTER — Institutional Professional Consult (permissible substitution): Payer: Medicare Other | Admitting: Internal Medicine

## 2012-09-14 ENCOUNTER — Ambulatory Visit (INDEPENDENT_AMBULATORY_CARE_PROVIDER_SITE_OTHER)
Admission: RE | Admit: 2012-09-14 | Discharge: 2012-09-14 | Disposition: A | Discharge status: Home / Self Care | Payer: Medicare Other | Source: Ambulatory Visit | Attending: Internal Medicine | Admitting: Internal Medicine

## 2012-09-14 ENCOUNTER — Encounter: Payer: Self-pay | Admitting: Internal Medicine

## 2012-09-14 ENCOUNTER — Ambulatory Visit (INDEPENDENT_AMBULATORY_CARE_PROVIDER_SITE_OTHER): Payer: Medicare Other | Admitting: Internal Medicine

## 2012-09-14 ENCOUNTER — Other Ambulatory Visit (INDEPENDENT_AMBULATORY_CARE_PROVIDER_SITE_OTHER): Payer: Medicare Other

## 2012-09-14 VITALS — BP 152/60 | HR 60 | Temp 97.5°F | Ht 60.0 in | Wt 149.6 lb

## 2012-09-14 DIAGNOSIS — R0609 Other forms of dyspnea: Secondary | ICD-10-CM

## 2012-09-14 DIAGNOSIS — J8410 Pulmonary fibrosis, unspecified: Secondary | ICD-10-CM

## 2012-09-14 DIAGNOSIS — R0600 Dyspnea, unspecified: Secondary | ICD-10-CM

## 2012-09-14 LAB — CBC WITH DIFFERENTIAL/PLATELET
Basophils Relative: 0.5 % (ref 0.0–3.0)
Eosinophils Absolute: 0.5 10*3/uL (ref 0.0–0.7)
HCT: 37 % (ref 36.0–46.0)
Hemoglobin: 11.9 g/dL — ABNORMAL LOW (ref 12.0–15.0)
MCHC: 32.3 g/dL (ref 30.0–36.0)
MCV: 91.4 fl (ref 78.0–100.0)
Monocytes Absolute: 0.9 10*3/uL (ref 0.1–1.0)
Neutro Abs: 8.6 10*3/uL — ABNORMAL HIGH (ref 1.4–7.7)
RBC: 4.05 Mil/uL (ref 3.87–5.11)

## 2012-09-14 NOTE — Progress Notes (Signed)
  Subjective:    Patient ID: Lori [name], female    DOB: 1935-10-22   MRN: [id number]  HPI  18 yowf never smoker no problems as child or adult with breathing " until after heart problems "  09/14/2012 1st pulmonary office eval/ Liyla Radliff with ild dating back to 2009 and variable sob since then never better than slow walking, maybe 50 ft, better since placed on 02 by Dr Daleen Squibb around one year prior to OV - better also on xopenex hfa but rarely uses it.   No obvious daytime variabilty or assoc significant chronic cough or excess mucus production or cp or chest tightness, subjective wheeze overt sinus or hb symptoms. No unusual exp hx (no macrodantin/ amio exposure) or h/o childhood pna/ asthma or premature birth to her knowledge. No h/o arthralgias RA or bowel dz  Sleeping ok without nocturnal  or early am exacerbation  of respiratory  c/o's or need for noct saba. Also denies any obvious fluctuation of symptoms with weather or environmental changes or other aggravating or alleviating factors except as outlined above   Review of Systems  Constitutional: Positive for chills, appetite change, fatigue and unexpected weight change. Negative for fever.  HENT: Positive for congestion, trouble swallowing and dental problem. Negative for ear pain, nosebleeds, sore throat, rhinorrhea, sneezing, postnasal drip and sinus pressure.   Eyes: Negative for redness and itching.  Respiratory: Positive for cough and shortness of breath. Negative for chest tightness and wheezing.   Cardiovascular: Positive for palpitations. Negative for leg swelling.  Gastrointestinal: Positive for abdominal pain. Negative for nausea and vomiting.  Genitourinary: Negative for dysuria.  Musculoskeletal: Negative for joint swelling.  Skin: Negative for rash.  Neurological: Positive for headaches.  Hematological: Does not bruise/bleed easily.  Psychiatric/Behavioral: Negative for dysphoric mood. The patient is nervous/anxious.          Objective:   Physical Exam  Chronically ill amb wf arrived off 02 with sats 76% Wt Readings from Last 3 Encounters:  09/14/12 149 lb 9.6 oz (67.858 kg)  08/27/12 156 lb 6.4 oz (70.943 kg)  08/19/12 144 lb 13.5 oz (65.7 kg)   HEENT: nl dentition, turbinates, and orophanx. Nl external ear canals without cough reflex   NECK :  without JVD/Nodes/TM/ nl carotid upstrokes bilaterally   LUNGS: no acc muscle use, insp crackles both bases   CV:  RRR  no s3 or murmur or increase in P2, no edema   ABD:  soft and nontender with nl excursion in the supine position. No bruits or organomegaly, bowel sounds nl  MS:  warm without deformities, calf tenderness, cyanosis or clubbing  SKIN: warm and dry without lesions    NEURO:  alert, approp, no deficits  CXR  09/14/2012 :  Cardiomegaly and chronic interstitial disease. No acute finding.          Assessment & Plan:

## 2012-09-14 NOTE — Assessment & Plan Note (Signed)
DDx for pulmonary fibrosis  includes idiopathic pulmonary fibrosis, pulmonary fibrosis associated with rheumatologic diseases (which have a relatively benign course in most cases), adverse effect from  drugs such as chemotherapy or amiodarone exposure (could not elicit here), nonspecific interstitial pneumonia which is typically steroid responsive, and chronic hypersensitivity pneumonitis.   In active  smokers Langerhan's Cell  Histiocyctosis (eosinophilic granuomatosis),  DIP,  and Respiratory Bronchiolitis ILD also need to be considered,    Not clear whether there is progression here as clinical picture obscured by element of diastolic heart failure  Needs baseline esr and pft's requested and more consistent use of 02 24/7 (discussed separately)

## 2012-09-14 NOTE — Patient Instructions (Addendum)
Only use your albuterol as a rescue medication to be used if you can't catch your breath by resting or doing a relaxed purse lip breathing pattern. The less you use it, the better it will work when you need it.   Please remember to go to the lab and x-ray department downstairs for your tests - we will call you with the results when they are available.  Please schedule a follow up office visit in 4-6 weeks, sooner if needed with pfts on return

## 2012-09-14 NOTE — Assessment & Plan Note (Signed)
-   09/14/2012   Walked 3lpm x one lap @ 185 stopped due to  89%   Adequate control on present rx, reviewed need to wear the 02 24/7 for now

## 2012-09-15 ENCOUNTER — Telehealth: Payer: Self-pay | Admitting: Internal Medicine

## 2012-09-15 NOTE — Telephone Encounter (Signed)
Result Note     Call pt: Reviewed cxr and no acute change so no change in recommendations made at ov  --  I spoke with patient about results and she verbalized understanding and had no questions 

## 2012-09-27 ENCOUNTER — Encounter: Payer: Self-pay | Admitting: Nurse Practitioner

## 2012-09-27 ENCOUNTER — Ambulatory Visit (INDEPENDENT_AMBULATORY_CARE_PROVIDER_SITE_OTHER): Payer: Medicare Other | Admitting: Nurse Practitioner

## 2012-09-27 VITALS — BP 160/84 | HR 64 | Ht 60.0 in | Wt 148.1 lb

## 2012-09-27 DIAGNOSIS — I10 Essential (primary) hypertension: Secondary | ICD-10-CM

## 2012-09-27 LAB — BASIC METABOLIC PANEL
BUN: 11 mg/dL (ref 6–23)
CO2: 31 mEq/L (ref 19–32)
Calcium: 9.3 mg/dL (ref 8.4–10.5)
Chloride: 101 mEq/L (ref 96–112)
Creatinine, Ser: 0.8 mg/dL (ref 0.4–1.2)
GFR: 73.14 mL/min (ref >60.00)
Glucose, Bld: 100 mg/dL — ABNORMAL HIGH (ref 70–99)
Potassium: 4.3 mEq/L (ref 3.5–5.1)
Sodium: 139 mEq/L (ref 135–145)

## 2012-09-27 MED ORDER — LISINOPRIL 10 MG PO TABS: 10.0000 mg | ORAL_TABLET | Freq: Every day | ORAL | Status: DC

## 2012-09-27 NOTE — Progress Notes (Signed)
Lori [name] Date of Birth: 1935/09/13 Medical Record #[id number]  History of Present Illness: Lori [name] is seen today for a one month check. She is seen for Dr. Daleen Squibb. She has had a recent admission for dyspnea with a lung infiltrate. Felt to be multifactorial. Treated with Levaquin and Xopenex. On oxygen. Seeing Dr. Sherene Sires. Her other issues include depression, noncompliance (previously out of her medicines for 3 months), HTN, chronic grade 1 diastolic dysfunction, OSA, mild AI, R BBB, anemia and history of prior melena with negative FOB.   I saw her a month ago. Restarted her lasix and potassium for some volume overload.   She comes back today. She is here with her husband. Doing better. Remains on her oxygen. Weight is down 8 pounds. Her swelling has improved. Still short of breath. No chest pain. Remains a little anxious and is using her Xanax. Needs a refill. Not able to check her blood pressure at home.   Current Outpatient Prescriptions on File Prior to Visit  Medication Sig Dispense Refill  . ALPRAZolam (XANAX) 0.25 MG tablet Take 0.5 mg by mouth every 8 (eight) hours as needed.      . Ascorbic Acid (VITAMIN C) 1000 MG tablet Take 1,000 mg by mouth daily.       Marland Kitchen aspirin 81 MG tablet Take 1 tablet (81 mg total) by mouth daily.      . Ergocalciferol (VITAMIN D2) 2000 UNITS TABS Take 1 tablet by mouth daily.      . furosemide (LASIX) 40 MG tablet Take 1 tablet (40 mg total) by mouth daily.  90 tablet  3  . levalbuterol (XOPENEX HFA) 45 MCG/ACT inhaler Inhale 1-2 puffs into the lungs every 4 (four) hours as needed for wheezing.  1 Inhaler  12  . metoprolol (LOPRESSOR) 50 MG tablet Take 1 tablet (50 mg total) by mouth 2 (two) times daily.  60 tablet  6  . nitroGLYCERIN (NITROSTAT) 0.4 MG SL tablet Place 1 tablet (0.4 mg total) under the tongue every 5 (five) minutes as needed. For chest pain  25 tablet  3  . pantoprazole (PROTONIX) 40 MG tablet Take 1 tablet (40 mg total) by mouth daily  at 6 (six) AM.  30 tablet  6  . potassium chloride SA (KLOR-CON M20) 20 MEQ tablet Take 1 tablet (20 mEq total) by mouth daily.  90 tablet  3    No Known Allergies  Past Medical History  Diagnosis Date  . RBBB   . HYPERTENSION   . DIASTOLIC HEART FAILURE, CHRONIC     a. 05/2008 Echo: EF 55-60%, Diast Dysfxn, mild-mod AI, triv MR;  b. 07/2012 Echo: EF 60-65%, Gr 1 DD, Mid AI.  Marland Kitchen AORTIC INSUFFICIENCY     a. mild-mod by echo 05/2008;  b. mild by echo 07/2012  . OSTEOPOROSIS   . DEPRESSION   . Chronic lung disease     a. on home O2;  b. 07/2012 CTA chest: chronic sev lung dzs, similar to 2009 CT, stable R paratracheal nodes.  . OBSTRUCTIVE SLEEP APNEA     pt denies this hx (08/17/2012) - though previously seen by Dr. Shelle Iron for this Dx.  . Daily headache   . Arthritis     "knees" (08/17/2012)  . Anxiety   . Depression     Past Surgical History  Procedure Date  . Rhinoplasty 1953    "broke it"    History  Smoking status  . Never Smoker   Smokeless tobacco  .  Never Used    History  Alcohol Use  . Yes    Comment: 08/17/2012 "rarely; on occasions"    Family History  Problem Relation Age of Onset  . Heart disease Sister   . Cancer Father     died in his 63's  . Other Mother     died @ 27 - "old age"  . Stroke Sister     deceased  . Cancer Brother     deceased    Review of Systems: The review of systems is per the HPI.  All other systems were reviewed and are negative.  Physical Exam: BP 160/84  Pulse 64  Ht 5' (1.524 m)  Wt 148 lb 1.9 oz (67.187 kg)  BMI 28.93 kg/m2 Patient is very pleasant and in no acute distress. Her weight is down 8 pounds. Has oxygen in place. Skin is warm and dry. Color is normal.  HEENT is unremarkable. Normocephalic/atraumatic. PERRL. Sclera are nonicteric. Neck is supple. No masses. No JVD. Lungs are clear. Cardiac exam shows a regular rate and rhythm. Abdomen is soft. Extremities are without edema. Gait and ROM are intact. No gross  neurologic deficits noted.  LABORATORY DATA: Pending for today.   Lab Results  Component Value Date   WBC 13.0* 09/14/2012   HGB 11.9* 09/14/2012   HCT 37.0 09/14/2012   PLT 342.0 09/14/2012   GLUCOSE 93 08/27/2012   CHOL 129 08/18/2012   TRIG 110 08/18/2012   HDL 41 08/18/2012   LDLCALC 66 08/18/2012   ALT 7 08/17/2012   AST 18 08/17/2012   NA 138 08/27/2012   K 4.1 08/27/2012   CL 107 08/27/2012   CREATININE 1.0 08/27/2012   BUN 10 08/27/2012   CO2 26 08/27/2012   TSH 1.682 08/17/2012   INR 1.06 08/17/2012   HGBA1C 5.4 08/17/2012     Assessment / Plan: 1. Dyspnea - felt to be multifactorial. Remains on oxygen. Weight is down 8 pounds with the restart of her Lasix.  2. HTN - BP is up. I have restarted the Lisinopril at 10 mg a day. She was previously on 20 mg. We will check a BMET today. Will see her back in a month with a repeat BMET.  3. Anxiety - I will defer his Xanax refill to Dr. Vern Claude discretion.   Overall, she does look better. Will see her back in a month. Patient is agreeable to this plan and will call if any problems develop in the interim.

## 2012-09-27 NOTE — Patient Instructions (Addendum)
We are going to add Lisinopril 10 mg a day for your blood pressure. This prescription is at [facility]  We need to check labs today (BMET)  Dr. Daleen Squibb will see you in a month with repeat labs - you do not need to fast  Call the [facility] office at [phone] if you have any questions, problems or concerns.

## 2012-10-12 ENCOUNTER — Telehealth: Payer: Self-pay | Admitting: *Deleted

## 2012-10-12 DIAGNOSIS — J8410 Pulmonary fibrosis, unspecified: Secondary | ICD-10-CM

## 2012-10-12 NOTE — Telephone Encounter (Signed)
Per OV note from 09/14/12---Patient to have PFTs done prior to next OV on 10/13/12.  Looking through patients chart this has not been done.  Called and spoke with patient and she verified that she has not had this done.  Nothing avaiable at our office for 10/13/12 PFTs so patient was set up at [facility] on 10/13/12 at 10am for PFTs.  Patient is aware of this appt and also reminded of appt with Dr. Sherene Sires following pfts.  Nothing further needed at this time.  Patient Instructions     Only use your albuterol as a rescue medication to be used if you can't catch your breath by resting or doing a relaxed purse lip breathing pattern. The less you use it, the better it will work when you need it.  Please remember to go to the lab and x-ray department downstairs for your tests - we will call you with the results when they are available.  Please schedule a follow up office visit in 4-6 weeks, sooner if needed with pfts on return

## 2012-10-13 ENCOUNTER — Ambulatory Visit (INDEPENDENT_AMBULATORY_CARE_PROVIDER_SITE_OTHER): Payer: Medicare Other | Admitting: Internal Medicine

## 2012-10-13 ENCOUNTER — Encounter: Payer: Self-pay | Admitting: Internal Medicine

## 2012-10-13 ENCOUNTER — Ambulatory Visit (HOSPITAL_COMMUNITY)
Admission: RE | Admit: 2012-10-13 | Discharge: 2012-10-13 | Disposition: A | Discharge status: Home / Self Care | Payer: Medicare Other | Source: Ambulatory Visit | Attending: Internal Medicine | Admitting: Internal Medicine

## 2012-10-13 VITALS — BP 110/64 | HR 63 | Temp 97.3°F | Ht 60.0 in | Wt 152.0 lb

## 2012-10-13 DIAGNOSIS — J9610 Chronic respiratory failure, unspecified whether with hypoxia or hypercapnia: Secondary | ICD-10-CM

## 2012-10-13 DIAGNOSIS — J8410 Pulmonary fibrosis, unspecified: Secondary | ICD-10-CM | POA: Insufficient documentation

## 2012-10-13 LAB — PULMONARY FUNCTION TEST

## 2012-10-13 MED ORDER — ALBUTEROL SULFATE (5 MG/ML) 0.5% IN NEBU
2.5000 mg | INHALATION_SOLUTION | Freq: Once | RESPIRATORY_TRACT | Status: AC
  Administered 2012-10-13: 2.5 mg via RESPIRATORY_TRACT

## 2012-10-13 NOTE — Progress Notes (Signed)
  Subjective:    Patient ID: Lori [name], female    DOB: 25-Feb-1935   MRN: [id number]  HPI  35 yowf never smoker no problems as child or adult with breathing " until after heart problems "  09/14/2012 1st pulmonary office eval/ Lori [name] with ild dating back to 2009 and   sob since then never better than slow walking, maybe 50 ft, better since placed on 02 by Dr Daleen Squibb around one year prior to OV - better also on xopenex hfa but rarely uses it.  rec Only use your albuterol as a rescue medication to be used if you can't catch your breath   Please remember to go to the lab  > esr 48  10/13/2012 f/u ov/Lori [name] cc no change doe on 3lpm, no change chronic orthopnea. No obvious daytime variabilty or assoc chronic cough or cp or chest tightness, subjective wheeze overt sinus or hb symptoms. No unusual exp hx .  Has not tried albuterol to see if improves activity tolerance  No obvious daytime variabilty or assoc significant chronic cough or excess mucus production or cp or chest tightness, subjective wheeze overt sinus or hb symptoms. No unusual exp hx (no macrodantin/ amio exposure) or h/o childhood pna/ asthma or premature birth to her knowledge. No h/o arthralgias RA or bowel dz  Sleeping ok without nocturnal  or early am exacerbation  of respiratory  c/o's or need for noct saba. Also denies any obvious fluctuation of symptoms with weather or environmental changes or other aggravating or alleviating factors except as outlined above           Objective:   Physical Exam  Chronically ill amb wf arrived off 02 with sats 76% Wt 152 10/13/2012  Wt Readings from Last 3 Encounters:  09/14/12 149 lb 9.6 oz (67.858 kg)  08/27/12 156 lb 6.4 oz (70.943 kg)  08/19/12 144 lb 13.5 oz (65.7 kg)   HEENT: nl dentition, turbinates, and orophanx. Nl external ear canals without cough reflex   NECK :  without JVD/Nodes/TM/ nl carotid upstrokes bilaterally   LUNGS: no acc muscle use, insp crackles both  bases   CV:  RRR  no s3 or murmur or increase in P2, no edema   ABD:  soft and nontender with nl excursion in the supine position. No bruits or organomegaly, bowel sounds nl  MS:  warm without deformities, calf tenderness, cyanosis or clubbing     CXR  09/14/2012 :  Cardiomegaly and chronic interstitial disease. No acute finding.          Assessment & Plan:

## 2012-10-13 NOTE — Patient Instructions (Addendum)
Try pantoprazole  Take 30-60 min before first meal of the day and Pepcid 20 mg one bedtime until return  GERD (REFLUX)  is an extremely common cause of respiratory symptoms, many times with no significant heartburn at all.    It can be treated with medication, but also with lifestyle changes including avoidance of late meals, excessive alcohol, smoking cessation, and avoid fatty foods, chocolate, peppermint, colas, red wine, and acidic juices such as orange juice.  NO MINT OR MENTHOL PRODUCTS SO NO COUGH DROPS  USE SUGARLESS CANDY INSTEAD (jolley ranchers or Stover's)  NO OIL BASED VITAMINS - use powdered substitutes.    Please schedule a follow up office visit in 6 weeks, call sooner if needed with CXR

## 2012-10-16 NOTE — Assessment & Plan Note (Signed)
-   ESR 48  09/14/12 - PFT's 10/13/12  FEV11 1.54 (71%) ratio 75 and no change p B2 with VC 1.219 (57%)   Clearly has element of PF with desats even on 02 p 2 laps 185 ft each  Use of PPI is associated with improved survival time and with decreased radiologic fibrosis per King's study published in AJRCCM vol 184 p1390.  Dec 2011  This may not be cause and effect, but given how universally unhelpful all the otherstudy drugs have been for pf,   rec start  rx ppi / diet/ lifestyle modification and f/u in 4-6wks

## 2012-10-16 NOTE — Assessment & Plan Note (Signed)
-   09/14/2012   Walked 3lpm x one lap @ 185 stopped due to  89%     - 10/13/2012   Walked 3lpm x two laps @ 185 stopped due to sob with sats 88%  Certainly she can do more at this point than walk 50 ft at least as long as she wears o2 > Adequate control on present rx, reviewed

## 2012-10-25 ENCOUNTER — Encounter: Payer: Self-pay | Admitting: Nurse Practitioner

## 2012-10-25 ENCOUNTER — Ambulatory Visit (INDEPENDENT_AMBULATORY_CARE_PROVIDER_SITE_OTHER): Payer: Medicare Other | Admitting: Nurse Practitioner

## 2012-10-25 VITALS — BP 130/60 | HR 60 | Ht 60.0 in | Wt 151.4 lb

## 2012-10-25 DIAGNOSIS — I10 Essential (primary) hypertension: Secondary | ICD-10-CM

## 2012-10-25 LAB — BASIC METABOLIC PANEL
BUN: 24 mg/dL — ABNORMAL HIGH (ref 6–23)
CO2: 27 mEq/L (ref 19–32)
Calcium: 9.3 mg/dL (ref 8.4–10.5)
Chloride: 101 mEq/L (ref 96–112)
Creatinine, Ser: 0.9 mg/dL (ref 0.4–1.2)
GFR: 66.45 mL/min (ref >60.00)
Glucose, Bld: 98 mg/dL (ref 70–99)
Potassium: 4 mEq/L (ref 3.5–5.1)
Sodium: 136 mEq/L (ref 135–145)

## 2012-10-25 NOTE — Progress Notes (Signed)
Lori [name] Date of Birth: July 16, 1935 Medical Record #[id number]  History of Present Illness: Lori [name] is seen back today for a one month check. She is seen for dr. Daleen Squibb. She has had a past admission for dyspnea with a lung infiltrate. Felt to be multifactorial. Treated with Levaquin and Xopenex. On oxygen. Seeing Dr. Sherene Sires. Other issues include depression, noncompliance (previously out of her medicines for 3 months), HTN, grade 1 diastolic dysfunction, OSA, mild AI, RBBB, anemia and history of prior melena with negative FOBs.   I saw her a month ago and started low dose ACE for her elevated BP. She was to follow up with Dr. Daleen Squibb.   She comes in today. She is here with her husband. Remains on her oxygen. Doing well. Blood pressure looks better. She is feeling better and feels stronger. No chest pain. Some mild dizziness if she gets up too quickly but overall she is pleased with how she is doing. No swelling. Feels ok on her medicines.   Current Outpatient Prescriptions on File Prior to Visit  Medication Sig Dispense Refill  . ALPRAZolam (XANAX) 0.25 MG tablet Take 0.5 mg by mouth every 8 (eight) hours as needed.      . Ascorbic Acid (VITAMIN C) 1000 MG tablet Take 1,000 mg by mouth daily.       Marland Kitchen aspirin 81 MG tablet Take 1 tablet (81 mg total) by mouth daily.      . Ergocalciferol (VITAMIN D2) 2000 UNITS TABS Take 1 tablet by mouth daily.      . famotidine (PEPCID) 10 MG tablet Take 20 mg by mouth at bedtime as needed.      . furosemide (LASIX) 40 MG tablet Take 1 tablet (40 mg total) by mouth daily.  90 tablet  3  . lisinopril (PRINIVIL,ZESTRIL) 10 MG tablet Take 1 tablet (10 mg total) by mouth daily.  30 tablet  6  . metoprolol (LOPRESSOR) 50 MG tablet Take 1 tablet (50 mg total) by mouth 2 (two) times daily.  60 tablet  6  . nitroGLYCERIN (NITROSTAT) 0.4 MG SL tablet Place 1 tablet (0.4 mg total) under the tongue every 5 (five) minutes as needed. For chest pain  25 tablet  3  .  potassium chloride SA (KLOR-CON M20) 20 MEQ tablet Take 1 tablet (20 mEq total) by mouth daily.  90 tablet  3    No Known Allergies  Past Medical History  Diagnosis Date  . RBBB   . HYPERTENSION   . DIASTOLIC HEART FAILURE, CHRONIC     a. 05/2008 Echo: EF 55-60%, Diast Dysfxn, mild-mod AI, triv MR;  b. 07/2012 Echo: EF 60-65%, Gr 1 DD, Mid AI.  Marland Kitchen AORTIC INSUFFICIENCY     a. mild-mod by echo 05/2008;  b. mild by echo 07/2012  . OSTEOPOROSIS   . DEPRESSION   . Chronic lung disease     a. on home O2;  b. 07/2012 CTA chest: chronic sev lung dzs, similar to 2009 CT, stable R paratracheal nodes.  . OBSTRUCTIVE SLEEP APNEA     pt denies this hx (08/17/2012) - though previously seen by Dr. Shelle Iron for this Dx.  . Daily headache   . Arthritis     "knees" (08/17/2012)  . Anxiety   . Depression     Past Surgical History  Procedure Date  . Rhinoplasty 1953    "broke it"    History  Smoking status  . Never Smoker   Smokeless tobacco  . Never  Used    History  Alcohol Use  . Yes    Comment: 08/17/2012 "rarely; on occasions"    Family History  Problem Relation Age of Onset  . Heart disease Sister   . Cancer Father     died in his 56's  . Other Mother     died @ 65 - "old age"  . Stroke Sister     deceased  . Cancer Brother     deceased    Review of Systems: The review of systems is per the HPI.  All other systems were reviewed and are negative.  Physical Exam: BP 130/60  Pulse 60  Ht 5' (1.524 m)  Wt 151 lb 6.4 oz (68.675 kg)  BMI 29.57 kg/m2 Patient is very pleasant and in no acute distress. She does look chronically ill. Skin is warm and dry. Color is normal.  HEENT is unremarkable except for poor teeth. Normocephalic/atraumatic. PERRL. Sclera are nonicteric. Neck is supple. No masses. No JVD. Lungs have some fine rales. Cardiac exam shows a regular rate and rhythm. Abdomen is soft. Extremities are without edema. Gait and ROM are intact. No gross neurologic deficits  noted.  LABORATORY DATA: BMET is pending.   Lab Results  Component Value Date   WBC 13.0* 09/14/2012   HGB 11.9* 09/14/2012   HCT 37.0 09/14/2012   PLT 342.0 09/14/2012   GLUCOSE 100* 09/27/2012   CHOL 129 08/18/2012   TRIG 110 08/18/2012   HDL 41 08/18/2012   LDLCALC 66 08/18/2012   ALT 7 08/17/2012   AST 18 08/17/2012   NA 139 09/27/2012   K 4.3 09/27/2012   CL 101 09/27/2012   CREATININE 0.8 09/27/2012   BUN 11 09/27/2012   CO2 31 09/27/2012   TSH 1.682 08/17/2012   INR 1.06 08/17/2012   HGBA1C 5.4 08/17/2012    Assessment / Plan: 1. HTN - back on low dose ACE - needs BMET today. I have left her on her current regimen. She is felt to be doing well from our standpoint.   2. Chronic dyspnea - felt to be multifactorial. Remains on oxygen. Seems stable at this time.   Overall, she is felt to be holding her own. No change in her current regimen. We will check a BMET today. See Dr. Daleen Squibb back in 6 months.   Patient is agreeable to this plan and will call if any problems develop in the interim.

## 2012-10-25 NOTE — Patient Instructions (Addendum)
I think you are doing well.  Stay on your current medicines  We need to check lab today (BMET)  Dr. Daleen Squibb will see you in 6 months.  Call the [facility] office at [phone] if you have any questions, problems or concerns.

## 2012-10-26 ENCOUNTER — Other Ambulatory Visit: Payer: Self-pay | Admitting: *Deleted

## 2012-10-26 DIAGNOSIS — N189 Chronic kidney disease, unspecified: Secondary | ICD-10-CM

## 2012-11-29 ENCOUNTER — Encounter: Payer: Self-pay | Admitting: Internal Medicine

## 2012-11-29 ENCOUNTER — Ambulatory Visit (INDEPENDENT_AMBULATORY_CARE_PROVIDER_SITE_OTHER)
Admission: RE | Admit: 2012-11-29 | Discharge: 2012-11-29 | Disposition: A | Discharge status: Home / Self Care | Payer: Medicare Other | Source: Ambulatory Visit | Attending: Internal Medicine | Admitting: Internal Medicine

## 2012-11-29 ENCOUNTER — Ambulatory Visit (INDEPENDENT_AMBULATORY_CARE_PROVIDER_SITE_OTHER): Payer: Medicare Other | Admitting: Internal Medicine

## 2012-11-29 VITALS — BP 130/70 | HR 80 | Temp 97.2°F | Ht 60.0 in | Wt 148.0 lb

## 2012-11-29 DIAGNOSIS — J8410 Pulmonary fibrosis, unspecified: Secondary | ICD-10-CM

## 2012-11-29 DIAGNOSIS — J9610 Chronic respiratory failure, unspecified whether with hypoxia or hypercapnia: Secondary | ICD-10-CM

## 2012-11-29 MED ORDER — PANTOPRAZOLE SODIUM 40 MG PO TBEC: 40.0000 mg | DELAYED_RELEASE_TABLET | Freq: Every day | ORAL | Status: DC

## 2012-11-29 MED ORDER — FAMOTIDINE 20 MG PO TABS: ORAL_TABLET | ORAL | Status: DC

## 2012-11-29 NOTE — Patient Instructions (Addendum)
Stay on pantoprazole 40 mg one daily before bfast and pepcid 20 mg one bedtime  Stay on 3lpm 24 h per day and see me in 3 months unless losing ground in meantime

## 2012-11-29 NOTE — Progress Notes (Signed)
  Subjective:    Patient ID: Lori [name], female    DOB: 02/07/1935   MRN: [id number]  HPI  34 yowf never smoker no problems as child or adult with breathing " until after heart problems "  09/14/2012 1st pulmonary office eval/ Wert with ild dating back to 2009 and sob since then never better than slow walking, maybe 50 ft, better since placed on 02 by Dr Daleen Squibb around one year prior to OV - better also on xopenex hfa but rarely uses it.  rec Only use your albuterol as a rescue medication to be used if you can't catch your breath   Please remember to go to the lab  > esr 48  10/13/2012 f/u ov/Wert cc no change doe on 3lpm, no change chronic orthopnea. rec Try pantoprazole  Take 30-60 min before first meal of the day and Pepcid 20 mg one bedtime until return GERD diet   11/29/2012 f/u ov/Wert cc no change doe on 3lpm 24 h per day.   No obvious daytime variabilty or assoc chronic cough or cp or chest tightness, subjective wheeze overt sinus or hb symptoms. No unusual exp hx .   Sleeping ok without nocturnal  or early am exacerbation  of respiratory  c/o's or need for noct saba. Also denies any obvious fluctuation of symptoms with weather or environmental changes or other aggravating or alleviating factors except as outlined above   ROS  The following are not active complaints unless bolded sore throat, dysphagia, dental problems, itching, sneezing,  nasal congestion or excess/ purulent secretions, ear ache,   fever, chills, sweats, unintended wt loss, pleuritic or exertional cp, hemoptysis,  orthopnea pnd or leg swelling, presyncope, palpitations, heartburn, abdominal pain, anorexia, nausea, vomiting, diarrhea  or change in bowel or urinary habits, change in stools or urine, dysuria,hematuria,  rash, arthralgias, visual complaints, headache, numbness weakness or ataxia or problems with walking or coordination,  change in mood/affect or memory.              Objective:   Physical  Exam  Chronically ill amb wf comfortable on 3lpm  Wt 152 10/13/2012 > 148 11/29/2012   HEENT: nl dentition, turbinates, and orophanx. Nl external ear canals without cough reflex   NECK :  without JVD/Nodes/TM/ nl carotid upstrokes bilaterally   LUNGS: no acc muscle use, insp crackles both bases   CV:  RRR  no s3 or murmur or increase in P2, no edema   ABD:  soft and nontender with nl excursion in the supine position. No bruits or organomegaly, bowel sounds nl  MS:  warm without deformities, calf tenderness, cyanosis or clubbing     CXR  09/14/2012 :  Cardiomegaly and chronic interstitial disease. No acute finding.          Assessment & Plan:

## 2012-11-30 NOTE — Assessment & Plan Note (Signed)
-   09/14/2012   Walked 3lpm x one lap @ 185 stopped due to  89%     - 10/13/2012   Walked 3lpm x two laps @ 185 stopped due to sob with sats 88%    - 11/29/2012  Walked 3lpm  x 2 laps @ 185 ft each stopped due to sob with sats 89%  Adequate control on present rx, reviewed

## 2012-11-30 NOTE — Assessment & Plan Note (Signed)
-   ESR 48  09/14/12 - PFT's 10/13/12  FEV11 1.54 (71%) ratio 75 and no change p B2 with VC 1.219 (57%)   She may have a mild inflammatory component worthy of a steroid challenge if we see any convincing progression with is lacking at present so will plan to follow conservatively on 02 and max gerd rx.

## 2012-12-01 ENCOUNTER — Other Ambulatory Visit (INDEPENDENT_AMBULATORY_CARE_PROVIDER_SITE_OTHER): Payer: Medicare Other

## 2012-12-01 DIAGNOSIS — N189 Chronic kidney disease, unspecified: Secondary | ICD-10-CM

## 2012-12-01 LAB — BASIC METABOLIC PANEL
BUN: 16 mg/dL (ref 6–23)
CO2: 29 mEq/L (ref 19–32)
Calcium: 9.4 mg/dL (ref 8.4–10.5)
Chloride: 105 mEq/L (ref 96–112)
Creatinine, Ser: 1 mg/dL (ref 0.4–1.2)
GFR: 58.67 mL/min — ABNORMAL LOW (ref >60.00)
Glucose, Bld: 82 mg/dL (ref 70–99)
Potassium: 3.7 mEq/L (ref 3.5–5.1)
Sodium: 139 mEq/L (ref 135–145)

## 2012-12-21 ENCOUNTER — Telehealth: Payer: Self-pay | Admitting: *Deleted

## 2012-12-21 MED ORDER — ALPRAZOLAM 0.25 MG PO TABS: 0.5000 mg | ORAL_TABLET | Freq: Three times a day (TID) | ORAL | Status: DC | PRN

## 2012-12-21 NOTE — Telephone Encounter (Signed)
pt called and asked for her xanax to be refilled. I stated I will refill today but will need to ask Dr. Daleen Squibb if he is going to continue to be provider for filling her xanax, pt said ok and thank you,

## 2012-12-23 ENCOUNTER — Telehealth: Payer: Self-pay | Admitting: Cardiology

## 2012-12-23 ENCOUNTER — Telehealth: Payer: Self-pay

## 2012-12-23 NOTE — Telephone Encounter (Signed)
Ms. Warnke called and I told her refill for Xanax was sent to [facility] pharmacy and she stated it was supposed to be sent to [facility] pharmacy but its fine that its at [facility] and she will go pick it up.

## 2012-12-23 NOTE — Telephone Encounter (Signed)
s/w pt about calling in for xanax refill again since we just filled the other day. pt states she thinks her husband called the wrong pharmacy. rx is waiting for pt at [facility] from the other day when I s/w pt myself, pt aware

## 2012-12-23 NOTE — Telephone Encounter (Signed)
Pt needs refill xanax 2.5 mg per Pharm call

## 2012-12-23 NOTE — Telephone Encounter (Signed)
Called and left a message for Ms. Lori [name] to return call to [place] about her Xanax refill

## 2012-12-31 ENCOUNTER — Other Ambulatory Visit: Payer: Self-pay | Admitting: *Deleted

## 2012-12-31 NOTE — Telephone Encounter (Signed)
Opened in Error.

## 2013-03-18 ENCOUNTER — Encounter: Payer: Self-pay | Admitting: Internal Medicine

## 2013-03-18 ENCOUNTER — Ambulatory Visit (INDEPENDENT_AMBULATORY_CARE_PROVIDER_SITE_OTHER): Payer: Medicare Other | Admitting: Internal Medicine

## 2013-03-18 VITALS — BP 124/60 | HR 71 | Temp 97.4°F | Ht 60.0 in | Wt 144.6 lb

## 2013-03-18 DIAGNOSIS — J9610 Chronic respiratory failure, unspecified whether with hypoxia or hypercapnia: Secondary | ICD-10-CM

## 2013-03-18 DIAGNOSIS — J8410 Pulmonary fibrosis, unspecified: Secondary | ICD-10-CM

## 2013-03-18 NOTE — Progress Notes (Signed)
  Subjective:    Patient ID: Laporshia Hogen, female    DOB: [birth year]   MRN: [id number]  HPI  26 yowf never smoker no problems as child or adult with breathing " until after heart problems "  09/14/2012 1st pulmonary office eval/ Wert with ild dating back to 2009 and sob since then never better than slow walking, maybe 50 ft, better since placed on 02 by Dr Daleen Squibb around one year prior to OV - better also on xopenex hfa but rarely uses it.  rec Only use your albuterol as a rescue medication to be used if you can't catch your breath   Please remember to go to the lab  > esr 48  10/13/2012 f/u ov/Wert cc no change doe on 3lpm, no change chronic orthopnea. rec Try pantoprazole  Take 30-60 min before first meal of the day and Pepcid 20 mg one bedtime until return GERD diet   11/29/2012 f/u ov/Wert cc no change doe on 3lpm 24 h per day. Stay on pantoprazole 40 mg one daily before bfast and pepcid 20 mg one bedtime  Stay on 3lpm 24 h per day   03/18/2013 f/u ov/Wert re pf Chief Complaint  Patient presents with  . Follow-up    Breathing is unchanged since the last visit.    not as consistent with using 02 as rec but not limited from desidered activities by sob.     No obvious daytime variabilty or assoc chronic cough or cp or chest tightness, subjective wheeze overt sinus or hb symptoms. No unusual exp hx .   Sleeping ok without nocturnal  or early am exacerbation  of respiratory  c/o's or need for noct saba. Also denies any obvious fluctuation of symptoms with weather or environmental changes or other aggravating or alleviating factors except as outlined above   ROS  The following are not active complaints unless bolded sore throat, dysphagia, dental problems, itching, sneezing,  nasal congestion or excess/ purulent secretions, ear ache,   fever, chills, sweats, unintended wt loss, pleuritic or exertional cp, hemoptysis,  orthopnea pnd or leg swelling, presyncope, palpitations, heartburn,  abdominal pain, anorexia, nausea, vomiting, diarrhea  or change in bowel or urinary habits, change in stools or urine, dysuria,hematuria,  rash, arthralgias, visual complaints, headache, numbness weakness or ataxia or problems with walking or coordination,  change in mood/affect or memory.              Objective:   Physical Exam  Chronically ill amb wf comfortable on 3lpm  Wt 152 10/13/2012 > 148 11/29/2012> 145 03/18/2013    HEENT: nl dentition, turbinates, and orophanx. Nl external ear canals without cough reflex   NECK :  without JVD/Nodes/TM/ nl carotid upstrokes bilaterally   LUNGS: no acc muscle use, insp crackles both bases   CV:  RRR  no s3 or murmur or increase in P2, no edema   ABD:  soft and nontender with nl excursion in the supine position. No bruits or organomegaly, bowel sounds nl  MS:  warm without deformities, calf tenderness, cyanosis or clubbing     CXR  09/14/2012 :  Cardiomegaly and chronic interstitial disease. No acute finding.          Assessment & Plan:

## 2013-03-18 NOTE — Patient Instructions (Addendum)
If your cough gets worse or persists then we will need to consider getting you off prinivil   Please schedule a follow up visit in 3 months but call sooner if needed with pfts

## 2013-03-21 NOTE — Assessment & Plan Note (Signed)
-   ESR 48  09/14/12 - PFT's 10/13/12  FEV11 1.54 (71%) ratio 75 and no change p B2   pft's not reviewable in epic, will ask they be sent again   Appears to be progressing despite max gerd rx so def need to consider prednisone trial if acute sympt flare (with repeat baseline esr first) vs perfenadone if approved by then.

## 2013-03-21 NOTE — Assessment & Plan Note (Signed)
-   09/14/2012   Walked 3lpm x one lap @ 185 stopped due to  89%     - 10/13/2012   Walked 3lpm x two laps @ 185 stopped due to sob with sats 88%    - 11/29/2012  Walked 3lpm  x 2 laps @ 185 ft each stopped due to sob with sats 89%   - 03/18/13 walked 3lpm x 1 lap and stopped due to sob, sats 86%  Rx 3lpm 24/7 as of 03/18/2013

## 2013-03-22 ENCOUNTER — Telehealth: Payer: Self-pay | Admitting: *Deleted

## 2013-03-22 DIAGNOSIS — J9610 Chronic respiratory failure, unspecified whether with hypoxia or hypercapnia: Secondary | ICD-10-CM

## 2013-03-22 NOTE — Telephone Encounter (Signed)
Message copied by Christen Butter on Tue Mar 22, 2013  3:53 PM ------      Message from: Sandrea Hughs B      Created: Mon Mar 21, 2013  2:29 PM       Need to get pft's from lab (final result not in epic) ------

## 2013-03-22 NOTE — Telephone Encounter (Signed)
Chart shows that she has not a PFT done since Nov 2013 I have called and requested that this be faxed to [phone]  Will hold in my basket until received

## 2013-03-24 NOTE — Telephone Encounter (Signed)
PFT in MW's lookat

## 2013-03-28 NOTE — Telephone Encounter (Signed)
Reviewed and results placed in overview box for chronic resp failure

## 2013-03-30 ENCOUNTER — Encounter: Payer: Self-pay | Admitting: Internal Medicine

## 2013-04-05 ENCOUNTER — Other Ambulatory Visit (HOSPITAL_COMMUNITY): Payer: Self-pay | Admitting: Nurse Practitioner

## 2013-04-08 ENCOUNTER — Encounter: Payer: Self-pay | Admitting: Internal Medicine

## 2013-04-29 ENCOUNTER — Other Ambulatory Visit: Payer: Self-pay | Admitting: *Deleted

## 2013-04-29 DIAGNOSIS — I10 Essential (primary) hypertension: Secondary | ICD-10-CM

## 2013-04-29 MED ORDER — LISINOPRIL 10 MG PO TABS: 10.0000 mg | ORAL_TABLET | Freq: Every day | ORAL | Status: DC

## 2013-06-20 ENCOUNTER — Encounter: Payer: Self-pay | Admitting: Internal Medicine

## 2013-06-20 ENCOUNTER — Ambulatory Visit (INDEPENDENT_AMBULATORY_CARE_PROVIDER_SITE_OTHER): Payer: Medicare Other | Admitting: Internal Medicine

## 2013-06-20 VITALS — BP 106/60 | HR 57 | Temp 97.3°F | Ht 58.5 in | Wt 148.0 lb

## 2013-06-20 DIAGNOSIS — J8410 Pulmonary fibrosis, unspecified: Secondary | ICD-10-CM

## 2013-06-20 DIAGNOSIS — R0989 Other specified symptoms and signs involving the circulatory and respiratory systems: Secondary | ICD-10-CM

## 2013-06-20 DIAGNOSIS — I10 Essential (primary) hypertension: Secondary | ICD-10-CM

## 2013-06-20 DIAGNOSIS — J9610 Chronic respiratory failure, unspecified whether with hypoxia or hypercapnia: Secondary | ICD-10-CM

## 2013-06-20 DIAGNOSIS — R0600 Dyspnea, unspecified: Secondary | ICD-10-CM

## 2013-06-20 MED ORDER — VALSARTAN 160 MG PO TABS: 160.0000 mg | ORAL_TABLET | Freq: Every day | ORAL | Status: DC

## 2013-06-20 MED ORDER — ALPRAZOLAM 0.25 MG PO TABS: 0.2500 mg | ORAL_TABLET | Freq: Three times a day (TID) | ORAL | Status: DC | PRN

## 2013-06-20 MED ORDER — NITROGLYCERIN 0.4 MG SL SUBL: 0.4000 mg | SUBLINGUAL_TABLET | SUBLINGUAL | Status: AC | PRN

## 2013-06-20 NOTE — Progress Notes (Signed)
PFT done today. 

## 2013-06-20 NOTE — Patient Instructions (Addendum)
Stop lisinopril Start diovan 160 mg one daily in place of lisinopril  Please see patient coordinator before you leave today  to schedule CT chest  Please remember to go to the lab   department downstairs for your tests - we will call you with the results when they are available.  No change in treatment for now pending study results  Please schedule a follow up office visit in 6 weeks, call sooner if needed

## 2013-06-20 NOTE — Progress Notes (Signed)
Subjective:    Patient ID: Lori [name], female    DOB: 02/20/1935   MRN: [id number]    Brief patient profile:  26 yowf never smoker no problems as child or adult with breathing " until after heart problems " dx with PF in 2009 placed on 02 in 2012 by Dr Daleen Squibb and first seen in pulmonary clinic 09/14/12    HPI 09/14/2012 1st pulmonary office eval/ Lori [name] with ild dating back to 2009 and sob since then never better than slow walking, maybe 50 ft, better since placed on 02 by Dr Daleen Squibb around one year prior to OV - better also on xopenex hfa but rarely uses it.  rec Only use your albuterol as a rescue medication to be used if you can't catch your breath   Please remember to go to the lab  > esr 48  10/13/2012 f/u ov/Lori [name] cc no change doe on 3lpm, no change chronic orthopnea. rec Try pantoprazole  Take 30-60 min before first meal of the day and Pepcid 20 mg one bedtime until return GERD diet   11/29/2012 f/u ov/Lori [name] cc no change doe on 3lpm 24 h per day. Stay on pantoprazole 40 mg one daily before bfast and pepcid 20 mg one bedtime Stay on 3lpm 24 h per day   03/18/2013 f/u ov/Lori [name] re pf Chief Complaint  Patient presents with  . Follow-up    Breathing is unchanged since the last visit.    not as consistent with using 02 as rec but not limited from desidered activities by sob.  rec If cough gets worse or persists then we will need to consider getting you off prinivil - otherwise no changes needed   06/20/2013 f/u ov/Lori [name] re PF Chief Complaint  Patient presents with  . Follow-up    Pt states breathing has unchanged, but spouse disagrees, stating that she is requiring more suplemental o2.   assoc with variable hoarseness and dry cough, day > night.    No obvious daytime variabilty or assoc  cp or chest tightness, subjective wheeze overt sinus or hb symptoms. No unusual exp hx .   Sleeping ok without nocturnal  or early am exacerbation  of respiratory  c/o's or need for noct saba. Also  denies any obvious fluctuation of symptoms with weather or environmental changes or other aggravating or alleviating factors except as outlined above     Current Medications, Allergies, Past Medical History, Past Surgical History, Family History, and Social History were reviewed in [employer] record.  ROS  The following are not active complaints unless bolded sore throat, dysphagia, dental problems, itching, sneezing,  nasal congestion or excess/ purulent secretions, ear ache,   fever, chills, sweats, unintended wt loss, pleuritic or exertional cp, hemoptysis,  orthopnea pnd or leg swelling, presyncope, palpitations, heartburn, abdominal pain, anorexia, nausea, vomiting, diarrhea  or change in bowel or urinary habits, change in stools or urine, dysuria,hematuria,  rash, arthralgias, visual complaints, headache, numbness weakness or ataxia or problems with walking or coordination,  change in mood/affect or memory.                  Objective:   Physical Exam  Chronically ill amb wf comfortable on 3lpm  Wt 152 10/13/2012 > 148 11/29/2012> 145 03/18/2013 > 148  06/20/13    HEENT: nl dentition, turbinates, and orophanx. Nl external ear canals without cough reflex   NECK :  without JVD/Nodes/TM/ nl carotid upstrokes bilaterally   LUNGS: no acc muscle use,  insp crackles both bases   CV:  RRR  no s3 or murmur or increase in P2, no edema   ABD:  soft and nontender with nl excursion in the supine position. No bruits or organomegaly, bowel sounds nl  MS:  warm without deformities, calf tenderness, cyanosis or clubbing     CXR   11/29/12 Stable chronic pulmonary fibrosis. No definite active process           Assessment & Plan:

## 2013-06-21 NOTE — Assessment & Plan Note (Signed)
ACE inhibitors are problematic in  pts with airway complaints because  even experienced pulmonologists can't always distinguish ace effects from copd/asthma.  By themselves they don't actually cause a problem, much like oxygen can't by itself start a fire, but they certainly serve as a powerful catalyst or enhancer for any "fire"  or inflammatory process in the upper airway, be it caused by an ET  tube or more commonly reflux (especially in the obese or pts with known GERD or who are on biphoshonates).    In the era of ARB near equivalency until we have a better handle on the reversibility of the airway problem, it just makes sense to avoid ACEI  entirely in the short run and then decide later, having established a level of airway control using a reasonable limited regimen, whether to add back ace but even then being very careful to observe the pt for worsening airway control and number of meds used/ needed to control symptoms.    Will try off acei for at least 6 weeks then regroup

## 2013-06-21 NOTE — Assessment & Plan Note (Signed)
-   PFT's 10/13/12  VC 2.09 no obst and no dLCO recorded    - 09/14/2012   Walked 3lpm x one lap @ 185 stopped due to  89%     - 10/13/2012   Walked 3lpm x two laps @ 185 stopped due to sob with sats 88%    - 11/29/2012  Walked 3lpm  x 2 laps @ 185 ft each stopped due to sob with sats 89%   - 03/18/13 walked 3lpm x 1 lap and stopped due to sob, sats 86%   Rx 3lpm 24/7 as of 03/18/2013   Adequate control on present rx, reviewed

## 2013-06-21 NOTE — Assessment & Plan Note (Signed)
-   ESR 48  09/14/12 - PFT's 10/13/12    VC 1.19, no dlco done  - PFT's 06/20/2013  VC 1.07 and no airflow obst, DLCO 46 corrects to 81%   Appears to have slowly progressive ILD typical of PF that is not really stabilizing on adequate gerd rx and may be a candidate for perfenodone.  Discussed in detail all the  indications, usual  risks and alternatives  relative to the benefits with patient who agrees to proceed with hrct and repeat esr/ collagen vasc screen then consider trial of steroids    Each maintenance medication was reviewed in detail including most importantly the difference between maintenance and as needed and under what circumstances the prns are to be used.  Please see instructions for details which were reviewed in writing and the patient given a copy.

## 2013-06-23 ENCOUNTER — Other Ambulatory Visit: Payer: Medicare Other

## 2013-06-24 ENCOUNTER — Encounter: Payer: Self-pay | Admitting: Internal Medicine

## 2013-06-24 ENCOUNTER — Telehealth: Payer: Self-pay | Admitting: *Deleted

## 2013-06-24 NOTE — Telephone Encounter (Signed)
LMTCB

## 2013-06-24 NOTE — Telephone Encounter (Signed)
Spoke with pt and notified of recs per MW She verbalized understanding and states no questions  I advised will call her with results once reviewed

## 2013-06-24 NOTE — Telephone Encounter (Signed)
Message copied by Christen Butter on Fri Jun 24, 2013  2:17 PM ------      Message from: Sandrea Hughs B      Created: Fri Jun 24, 2013  1:47 PM       Needs labs to decide what to do re CT      Add ACE level dx sarcoid ------

## 2013-06-27 ENCOUNTER — Encounter: Payer: Self-pay | Admitting: Internal Medicine

## 2013-06-27 ENCOUNTER — Telehealth: Payer: Self-pay | Admitting: Internal Medicine

## 2013-06-27 NOTE — Telephone Encounter (Signed)
I spoke with pt and she stated she had a call from [place]. I do not see anything. She stated she had CT done last week at [facility]. Please advise MW thanks

## 2013-06-29 NOTE — Telephone Encounter (Signed)
i discussed this directly with pt and set up f/u ov to consider options

## 2013-06-30 ENCOUNTER — Encounter: Payer: Self-pay | Admitting: Internal Medicine

## 2013-06-30 ENCOUNTER — Ambulatory Visit (INDEPENDENT_AMBULATORY_CARE_PROVIDER_SITE_OTHER): Payer: Medicare Other | Admitting: Internal Medicine

## 2013-06-30 VITALS — BP 122/68 | HR 95 | Temp 97.7°F | Ht 59.0 in | Wt 142.0 lb

## 2013-06-30 DIAGNOSIS — J9610 Chronic respiratory failure, unspecified whether with hypoxia or hypercapnia: Secondary | ICD-10-CM

## 2013-06-30 DIAGNOSIS — J8410 Pulmonary fibrosis, unspecified: Secondary | ICD-10-CM

## 2013-06-30 NOTE — Progress Notes (Signed)
Subjective:    Patient ID: Lori [name], female    DOB: 1935/11/02   MRN: [id number]    Brief patient profile:  52 yowf never smoker no problems as child or adult with breathing " until after heart problems " dx with PF in 2009 placed on 02 in 2012 by Dr Daleen Squibb and first seen in pulmonary clinic 09/14/12.  Dr Cloyd Stagers Bonsu   HPI 09/14/2012 1st pulmonary office eval/ Jahmeir Geisen with ild dating back to 2009 and sob since then never better than slow walking, maybe 50 ft, better since placed on 02 by Dr Daleen Squibb around one year prior to OV - better also on xopenex hfa but rarely uses it.  rec Only use your albuterol as a rescue medication to be used if you can't catch your breath   Please remember to go to the lab  > esr 48  10/13/2012 f/u ov/Aleksandar Duve cc no change doe on 3lpm, no change chronic orthopnea. rec Try pantoprazole  Take 30-60 min before first meal of the day and Pepcid 20 mg one bedtime until return GERD diet   11/29/2012 f/u ov/Fatou Dunnigan cc no change doe on 3lpm 24 h per day. Stay on pantoprazole 40 mg one daily before bfast and pepcid 20 mg one bedtime Stay on 3lpm 24 h per day   03/18/2013 f/u ov/Roderick Calo re pf Chief Complaint  Patient presents with  . Follow-up    Breathing is unchanged since the last visit.    not as consistent with using 02 as rec but not limited from desidered activities by sob.  rec If cough gets worse or persists then we will need to consider getting you off prinivil - otherwise no changes needed   06/20/2013 f/u ov/Shiraz Bastyr re PF Chief Complaint  Patient presents with  . Follow-up    Pt states breathing has unchanged, but spouse disagrees, stating that she is requiring more suplemental o2.   assoc with variable hoarseness and dry cough, day > night.  rec Stop lisinopril Start diovan 160 mg one daily in place of lisinopril Please see patient coordinator before you leave today  to schedule CT chest Please remember to go to the lab department downstairs for your tests -  we will call you with the results when they are available > did not go   06/30/2013 f/u ov/Elior Robinette re pf Chief Complaint  Patient presents with  . Follow-up    Pt states breathing unchanged since her last visit. No new c/o's today. Sats 77% 3lpm  when pt arrived- increased to   no cough at all.   No obvious daytime variabilty or assoc  cp or chest tightness, subjective wheeze overt sinus or hb symptoms. No unusual exp hx .   Sleeping ok without nocturnal  or early am exacerbation  of respiratory  c/o's or need for noct saba. Also denies any obvious fluctuation of symptoms with weather or environmental changes or other aggravating or alleviating factors except as outlined above     Current Medications, Allergies, Past Medical History, Past Surgical History, Family History, and Social History were reviewed in [employer] record.  ROS  The following are not active complaints unless bolded sore throat, dysphagia, dental problems, itching, sneezing,  nasal congestion or excess/ purulent secretions, ear ache,   fever, chills, sweats, unintended wt loss, pleuritic or exertional cp, hemoptysis,  orthopnea pnd or leg swelling, presyncope, palpitations, heartburn, abdominal pain, anorexia, nausea, vomiting, diarrhea  or change in bowel or urinary habits, change in  stools or urine, dysuria,hematuria,  rash, arthralgias, visual complaints, headache, numbness weakness or ataxia or problems with walking or coordination,  change in mood/affect or memory.                  Objective:   Physical Exam  Chronically ill amb wf comfortable on 3lpm  Wt 152 10/13/2012 > 148 11/29/2012> 145 03/18/2013 > 148  06/20/13 > 06/30/2013 142    HEENT: nl dentition, turbinates, and orophanx. Nl external ear canals without cough reflex   NECK :  without JVD/Nodes/TM/ nl carotid upstrokes bilaterally   LUNGS: no acc muscle use, insp crackles both bases   CV:  RRR  no s3 or murmur or increase in P2,  no edema   ABD:  soft and nontender with nl excursion in the supine position. No bruits or organomegaly, bowel sounds nl  MS:  warm without deformities, calf tenderness, cyanosis or clubbing     CXR   11/29/12 Stable chronic pulmonary fibrosis. No definite active process           Assessment & Plan:

## 2013-06-30 NOTE — Patient Instructions (Addendum)
Please remember to go to the lab  department downstairs for your tests - we will call you with the results when they are available.  02 is 3lpm 24/7 but 4 lpm with walking   I will call to schedule outpatient bronchoscopy once I have reviewed all your tests. This is done at [facility] via the outpatient entance at 715 am with nothing to eat or drink past midnight the night before the proceudre

## 2013-07-02 ENCOUNTER — Encounter: Payer: Self-pay | Admitting: Internal Medicine

## 2013-07-02 NOTE — Assessment & Plan Note (Signed)
-   PFT's 10/13/12  VC 2.09 no obst and no dLCO recorded    - 09/14/2012   Walked 3lpm x one lap @ 185 stopped due to  89%     - 10/13/2012   Walked 3lpm x two laps @ 185 stopped due to sob with sats 88%    - 11/29/2012  Walked 3lpm  x 2 laps @ 185 ft each stopped due to sob with sats 89%   - 03/18/13 walked 3lpm x 1 lap and stopped due to sob, sats 86%    -06/30/2013   Walked  4lpm x one lap @ 185 stopped due to  87%   rec 3lpm 24/7 but increase to 4lpm with activity

## 2013-07-02 NOTE — Assessment & Plan Note (Signed)
-   ESR 48  09/14/12 - PFT's 10/13/12    VC 1.19, no dlco done  - PFT's 06/20/2013  VC 1.07 and no airflow obst, DLCO 46 corrects to 81% - CT chest 06/23/13 >  Med and bilateral hilar adenopathy typical of sarcoid with no honecombing > rec labs and ace level  Very atypical age/ race / hx for sarcoid but also lymphoma and lymphangitic ca need to be considered in ddx and there is a reasonable chance of a tissue dx by tbbx.  Discussed in detail all the  indications, usual  risks and alternatives  relative to the benefits with patient who agrees to proceed with bronchoscopy with biopsy.

## 2013-07-04 ENCOUNTER — Ambulatory Visit (INDEPENDENT_AMBULATORY_CARE_PROVIDER_SITE_OTHER): Payer: Medicare Other

## 2013-07-04 DIAGNOSIS — J8410 Pulmonary fibrosis, unspecified: Secondary | ICD-10-CM

## 2013-07-04 DIAGNOSIS — R0600 Dyspnea, unspecified: Secondary | ICD-10-CM

## 2013-07-04 DIAGNOSIS — R0989 Other specified symptoms and signs involving the circulatory and respiratory systems: Secondary | ICD-10-CM

## 2013-07-04 LAB — CBC WITH DIFFERENTIAL/PLATELET
Basophils Absolute: 0.1 10*3/uL (ref 0.0–0.1)
Basophils Relative: 0.5 % (ref 0.0–3.0)
Eosinophils Absolute: 0.5 10*3/uL (ref 0.0–0.7)
HCT: 43.8 % (ref 36.0–46.0)
Hemoglobin: 14.4 g/dL (ref 12.0–15.0)
Lymphocytes Relative: 22.1 % (ref 12.0–46.0)
Lymphs Abs: 2.7 10*3/uL (ref 0.7–4.0)
MCHC: 32.9 g/dL (ref 30.0–36.0)
MCV: 90.3 fl (ref 78.0–100.0)
Monocytes Absolute: 0.9 10*3/uL (ref 0.1–1.0)
Neutro Abs: 8.1 10*3/uL — ABNORMAL HIGH (ref 1.4–7.7)
RDW: 14.7 % — ABNORMAL HIGH (ref 11.5–14.6)

## 2013-07-05 ENCOUNTER — Encounter: Payer: Self-pay | Admitting: Internal Medicine

## 2013-07-05 LAB — ANGIOTENSIN CONVERTING ENZYME: Angiotensin-Converting Enzyme: 67 U/L — ABNORMAL HIGH (ref 8–52)

## 2013-07-05 MED ORDER — PREDNISONE (PAK) 10 MG PO TABS: ORAL_TABLET | ORAL | Status: DC

## 2013-07-12 ENCOUNTER — Encounter: Payer: Self-pay | Admitting: Internal Medicine

## 2013-07-12 ENCOUNTER — Ambulatory Visit (INDEPENDENT_AMBULATORY_CARE_PROVIDER_SITE_OTHER): Payer: Medicare Other | Admitting: Internal Medicine

## 2013-07-12 VITALS — BP 114/68 | HR 64 | Temp 97.3°F | Ht 60.0 in | Wt 145.6 lb

## 2013-07-12 DIAGNOSIS — J8410 Pulmonary fibrosis, unspecified: Secondary | ICD-10-CM

## 2013-07-12 MED ORDER — ALPRAZOLAM 0.25 MG PO TABS: 0.2500 mg | ORAL_TABLET | Freq: Three times a day (TID) | ORAL | Status: DC | PRN

## 2013-07-12 NOTE — Patient Instructions (Addendum)
Continue prednisone dosed 2 each am with bfast, once breathing better reduce to one  Please schedule a follow up office visit in 4 weeks, sooner if needed with cxr on return

## 2013-07-12 NOTE — Assessment & Plan Note (Signed)
-   ESR 48  09/14/12 - PFT's 10/13/12    VC 1.19, no dlco done  - PFT's 06/20/2013  VC 1.07 and no airflow obst, DLCO 46 corrects to 81% - CT chest 06/23/13 >  Med and bilateral hilar adenopathy typical of sarcoid with no honecombing  07/04/13 ACEI =67/ esr 45 >   start Pred 07/06/11  I had an extended discussion with the patient today lasting 15 to 20 minutes of a 25 minute visit on the following issues:  Discussed in detail all the  indications, usual  risks and alternatives  relative to the benefits with patient who agrees to proceed with continuing rx with empiric steroids having improved thus far s no apparent side effects.   The goal with a chronic steroid dependent illness is always arriving at the lowest effective dose that controls the disease/symptoms and not accepting a set "formula" which is based on statistics or guidelines that don't always take into account patient  variability or the natural hx of the dz in every individual patient, which may well vary over time.  For now therefore I recommend the patient maintain  20 per day until better then 10 mg per day   F/u 4 weeks with cxr

## 2013-07-12 NOTE — Progress Notes (Signed)
Subjective:    Patient ID: Lori [name], female    DOB: 24-Oct-1935   MRN: [id number]    Brief patient profile:  12 yowf never smoker no problems as child or adult with breathing " until after heart problems " dx with PF in 2009 placed on 02 in 2012 by Dr Daleen Squibb and first seen in pulmonary clinic 09/14/12.  Dr Cloyd Stagers Bonsu   HPI 09/14/2012 1st pulmonary office eval/ Camie Hauss with ild dating back to 2009 and sob since then never better than slow walking, maybe 50 ft, better since placed on 02 by Dr Daleen Squibb around one year prior to OV - better also on xopenex hfa but rarely uses it.  rec Only use your albuterol as a rescue medication to be used if you can't catch your breath   Please remember to go to the lab  > esr 48  10/13/2012 f/u ov/Shekia Kuper cc no change doe on 3lpm, no change chronic orthopnea. rec Try pantoprazole  Take 30-60 min before first meal of the day and Pepcid 20 mg one bedtime until return GERD diet   11/29/2012 f/u ov/Loralei Radcliffe cc no change doe on 3lpm 24 h per day. Stay on pantoprazole 40 mg one daily before bfast and pepcid 20 mg one bedtime Stay on 3lpm 24 h per day   03/18/2013 f/u ov/Javeion Cannedy re pf Chief Complaint  Patient presents with  . Follow-up    Breathing is unchanged since the last visit.    not as consistent with using 02 as rec but not limited from desidered activities by sob.  rec If cough gets worse or persists then we will need to consider getting you off prinivil - otherwise no changes needed   06/20/2013 f/u ov/Tamela Elsayed re PF Chief Complaint  Patient presents with  . Follow-up    Pt states breathing has unchanged, but spouse disagrees, stating that she is requiring more suplemental o2.   assoc with variable hoarseness and dry cough, day > night.  rec Stop lisinopril Start diovan 160 mg one daily in place of lisinopril Please see patient coordinator before you leave today  to schedule CT chest Please remember to go to the lab department downstairs for your tests -  we will call you with the results when they are available > did not go   06/30/2013 f/u ov/Michal Strzelecki re pf Chief Complaint  Patient presents with  . Follow-up    Pt states breathing unchanged since her last visit. No new c/o's today. Sats 77% 3lpm  when pt arrived- increased to   no cough at all. rec check ace level, consider fob vs empiric rx> level  67 so chose pred trial   07/05/13 started prednisone 10 mg 2 each am  07/12/2013 f/u ov/Lonna Rabold/ prednisone 20 mg / 02 3lpm Chief Complaint  Patient presents with  . Follow-up    Breathing is better "sometimes."  Still has DOE.  No wheezing, chest tightness, chest pain, or cough.  usually could do HT pushing a cart but freq ride even on 02, chasing a toddler around the house even on 02 had been making her sob, both improving only 7 days into steroid rx    No obvious daytime variabilty or assoc cough  cp or chest tightness, subjective wheeze overt sinus or hb symptoms. No unusual exp hx .   Sleeping ok without nocturnal  or early am exacerbation  of respiratory  c/o's or need for noct saba. Also denies any obvious fluctuation of symptoms with weather or  environmental changes or other aggravating or alleviating factors except as outlined above     Current Medications, Allergies, Past Medical History, Past Surgical History, Family History, and Social History were reviewed in [employer] record.  ROS  The following are not active complaints unless bolded sore throat, dysphagia, dental problems, itching, sneezing,  nasal congestion or excess/ purulent secretions, ear ache,   fever, chills, sweats, unintended wt loss, pleuritic or exertional cp, hemoptysis,  orthopnea pnd or leg swelling, presyncope, palpitations, heartburn, abdominal pain, anorexia, nausea, vomiting, diarrhea  or change in bowel or urinary habits, change in stools or urine, dysuria,hematuria,  rash, arthralgias, visual complaints, headache, numbness weakness or  ataxia or problems with walking or coordination,  change in mood/affect or memory.                  Objective:   Physical Exam  Chronically ill w/c bound wf  on 3lpm  Wt 152 10/13/2012 > 148 11/29/2012> 145 03/18/2013 > 148  06/20/13 > 06/30/2013 142 > 07/12/2013  145    HEENT: nl dentition, turbinates, and orophanx. Nl external ear canals without cough reflex   NECK :  without JVD/Nodes/TM/ nl carotid upstrokes bilaterally   LUNGS: no acc muscle use, min  insp crackles both bases   CV:  RRR  no s3 or murmur or increase in P2, no edema   ABD:  soft and nontender with nl excursion in the supine position. No bruits or organomegaly, bowel sounds nl  MS:  warm without deformities, calf tenderness, cyanosis or clubbing     CXR   11/29/12 Stable chronic pulmonary fibrosis. No definite active process           Assessment & Plan:

## 2013-07-21 ENCOUNTER — Encounter: Payer: Self-pay | Admitting: Cardiology

## 2013-07-21 ENCOUNTER — Ambulatory Visit (INDEPENDENT_AMBULATORY_CARE_PROVIDER_SITE_OTHER): Payer: Medicare Other | Admitting: Cardiology

## 2013-07-21 VITALS — BP 116/62 | HR 64 | Ht 60.0 in | Wt 145.0 lb

## 2013-07-21 DIAGNOSIS — J8410 Pulmonary fibrosis, unspecified: Secondary | ICD-10-CM

## 2013-07-21 DIAGNOSIS — I10 Essential (primary) hypertension: Secondary | ICD-10-CM

## 2013-07-21 DIAGNOSIS — G4733 Obstructive sleep apnea (adult) (pediatric): Secondary | ICD-10-CM

## 2013-07-21 DIAGNOSIS — I4510 Unspecified right bundle-branch block: Secondary | ICD-10-CM

## 2013-07-21 DIAGNOSIS — I5032 Chronic diastolic (congestive) heart failure: Secondary | ICD-10-CM

## 2013-07-21 DIAGNOSIS — I359 Nonrheumatic aortic valve disorder, unspecified: Secondary | ICD-10-CM

## 2013-07-21 NOTE — Patient Instructions (Addendum)
Your physician recommends that you continue on your current medications as directed. Please refer to the Current Medication list given to you today.  Your physician wants you to follow-up in: 6 months with Dr. Tobias Alexander.  You will receive a reminder letter in the mail two months in advance. If you don't receive a letter, please call our office to schedule the follow-up appointment.
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
## 2013-07-21 NOTE — Assessment & Plan Note (Signed)
Clinically stable. Nonaggressive treatment and evaluation secondary to end-stage lung disease.

## 2013-07-21 NOTE — Assessment & Plan Note (Signed)
Stable. Continue current medical program. Return the office in 6 months to see Dr. Delton See.

## 2013-08-01 ENCOUNTER — Ambulatory Visit: Payer: Medicare Other | Admitting: Internal Medicine

## 2013-08-09 ENCOUNTER — Encounter: Payer: Self-pay | Admitting: Internal Medicine

## 2013-08-09 ENCOUNTER — Ambulatory Visit (INDEPENDENT_AMBULATORY_CARE_PROVIDER_SITE_OTHER): Payer: Medicare Other | Admitting: Internal Medicine

## 2013-08-09 ENCOUNTER — Telehealth: Payer: Self-pay | Admitting: Internal Medicine

## 2013-08-09 VITALS — BP 118/70 | HR 64 | Temp 97.0°F | Ht 61.0 in | Wt 150.4 lb

## 2013-08-09 DIAGNOSIS — J9610 Chronic respiratory failure, unspecified whether with hypoxia or hypercapnia: Secondary | ICD-10-CM

## 2013-08-09 DIAGNOSIS — J8410 Pulmonary fibrosis, unspecified: Secondary | ICD-10-CM

## 2013-08-09 MED ORDER — PREDNISONE (PAK) 10 MG PO TABS: ORAL_TABLET | ORAL | Status: DC

## 2013-08-09 NOTE — Patient Instructions (Addendum)
Continue prednisone at 10 mg daily - ok to increase to 20 mg per day if worse - take with breakfast in am  Please schedule a follow up office visit in 6 weeks, call sooner if needed with cxr

## 2013-08-09 NOTE — Telephone Encounter (Signed)
Claified with [facility] that the pt is on a daily regime of Prednisone. MW sent in the 10mg  pred pak.

## 2013-08-09 NOTE — Progress Notes (Signed)
Subjective:    Patient ID: Lori [name], female    DOB: 12-26-1934   MRN: [id number]    Brief patient profile:  44 yowf never smoker no problems as child or adult with breathing " until after heart problems " dx with PF in 2009 placed on 02 in 2012 by Dr Daleen Squibb and first seen in pulmonary clinic 09/14/12.  Dr Cloyd Stagers Bonsu   HPI 09/14/2012 1st pulmonary office eval/ Wert with ild dating back to 2009 and sob since then never better than slow walking, maybe 50 ft, better since placed on 02 by Dr Daleen Squibb around one year prior to OV - better also on xopenex hfa but rarely uses it.  rec Only use your albuterol as a rescue medication to be used if you can't catch your breath   Please remember to go to the lab  > esr 48  10/13/2012 f/u ov/Wert cc no change doe on 3lpm, no change chronic orthopnea. rec Try pantoprazole  Take 30-60 min before first meal of the day and Pepcid 20 mg one bedtime until return GERD diet   11/29/2012 f/u ov/Wert cc no change doe on 3lpm 24 h per day. Stay on pantoprazole 40 mg one daily before bfast and pepcid 20 mg one bedtime Stay on 3lpm 24 h per day   03/18/2013 f/u ov/Wert re pf Chief Complaint  Patient presents with  . Follow-up    Breathing is unchanged since the last visit.    not as consistent with using 02 as rec but not limited from desidered activities by sob.  rec If cough gets worse or persists then we will need to consider getting you off prinivil - otherwise no changes needed   06/20/2013 f/u ov/Wert re PF Chief Complaint  Patient presents with  . Follow-up    Pt states breathing has unchanged, but spouse disagrees, stating that she is requiring more suplemental o2.   assoc with variable hoarseness and dry cough, day > night.  rec Stop lisinopril Start diovan 160 mg one daily in place of lisinopril Please see patient coordinator before you leave today  to schedule CT chest Please remember to go to the lab department downstairs for your tests -  we will call you with the results when they are available > did not go   06/30/2013 f/u ov/Wert re pf Chief Complaint  Patient presents with  . Follow-up    Pt states breathing unchanged since her last visit. No new c/o's today. Sats 77% 3lpm  when pt arrived- increased to   no cough at all. rec check ace level, consider fob vs empiric rx> level  67 so chose pred trial   07/05/13 started prednisone 10 mg 2 each am  07/12/2013 f/u ov/Wert/ prednisone 20 mg / 02 3lpm Chief Complaint  Patient presents with  . Follow-up    Breathing is better "sometimes."  Still has DOE.  No wheezing, chest tightness, chest pain, or cough.  usually could do HT pushing a cart but freq ride even on 02, chasing a toddler around the house even on 02 had been making her sob, both improving only 7 days into steroid rx Rec Continue prednisone dosed 2 each am with bfast, once breathing better reduce to one   08/09/2013 f/u ov/Wert re: pf/adenopathy/ 3lpm at rest and 4lpm walking  Chief Complaint  Patient presents with  . Follow-up    pt reports having good days and bad days, feels prednisone has overall improved her breathing-- denies any  other concerns at this time   reduced pred to 10 mg per day 7 day prior to OV   because  Breathing better    No obvious daytime variabilty or assoc cough  cp or chest tightness, subjective wheeze overt sinus or hb symptoms. No unusual exp hx .   Sleeping ok without nocturnal  or early am exacerbation  of respiratory  c/o's or need for noct saba. Also denies any obvious fluctuation of symptoms with weather or environmental changes or other aggravating or alleviating factors except as outlined above     Current Medications, Allergies, Past Medical History, Past Surgical History, Family History, and Social History were reviewed in [employer] record.  ROS  The following are not active complaints unless bolded sore throat, dysphagia, dental problems,  itching, sneezing,  nasal congestion or excess/ purulent secretions, ear ache,   fever, chills, sweats, unintended wt loss, pleuritic or exertional cp, hemoptysis,  orthopnea pnd or leg swelling, presyncope, palpitations, heartburn, abdominal pain, anorexia, nausea, vomiting, diarrhea  or change in bowel or urinary habits, change in stools or urine, dysuria,hematuria,  rash, arthralgias, visual complaints, headache, numbness weakness or ataxia or problems with walking or coordination,  change in mood/affect or memory.                  Objective:   Physical Exam  amb  wf  on 4lpm  Wt 152 10/13/2012 > 148 11/29/2012> 145 03/18/2013 > 148  06/20/13 > 06/30/2013 142 > 07/12/2013  145 > 150 08/09/2013     HEENT: nl dentition, turbinates, and orophanx. Nl external ear canals without cough reflex   NECK :  without JVD/Nodes/TM/ nl carotid upstrokes bilaterally   LUNGS: no acc muscle use, min  insp crackles both bases   CV:  RRR  no s3 or murmur or increase in P2, no edema   ABD:  soft and nontender with nl excursion in the supine position. No bruits or organomegaly, bowel sounds nl  MS:  warm without deformities, calf tenderness, cyanosis or clubbing     CXR   11/29/12 Stable chronic pulmonary fibrosis. No definite active process           Assessment & Plan:

## 2013-08-10 ENCOUNTER — Other Ambulatory Visit (HOSPITAL_COMMUNITY): Payer: Self-pay | Admitting: Cardiology

## 2013-08-10 NOTE — Assessment & Plan Note (Signed)
-   PFT's 10/13/12  VC 2.09 no obst and no dLCO recorded    - 09/14/2012   Walked 3lpm x one lap @ 185 stopped due to  89%     - 10/13/2012   Walked 3lpm x two laps @ 185 stopped due to sob with sats 88%    - 11/29/2012  Walked 3lpm  x 2 laps @ 185 ft each stopped due to sob with sats 89%   - 03/18/13 walked 3lpm x 1 lap and stopped due to sob, sats 86%    -06/30/2013   Walked  4lpm x one lap @ 185 stopped due to  87%    - 08/09/13    Walked 4lpmx 2  lap @ 185 stopped due to  desat with sats 90% p one lap  rx 3lpm x 4lpm with activity

## 2013-08-10 NOTE — Assessment & Plan Note (Signed)
-   ESR 48  09/14/12 - PFT's 10/13/12    VC 1.19, no dlco done  - PFT's 06/20/2013  VC 1.07 and no airflow obst, DLCO 46 corrects to 81% - CT chest 06/23/13 >  Med and bilateral hilar adenopathy typical of sarcoid with no honecombing  07/04/13 ACEI =67/ esr 45 >   start Pred 07/05/13   The goal with a chronic steroid dependent illness is always arriving at the lowest effective dose that controls the disease/symptoms and not accepting a set "formula" which is based on statistics or guidelines that don't always take into account patient  variability or the natural hx of the dz in every individual patient, which may well vary over time.  For now therefore I recommend the patient maintain  Ceiling of 20 mg and a floor of 10 mg per day  Offered bx but pt not inclined to consider it for now as feels  doing much better.

## 2013-09-08 ENCOUNTER — Other Ambulatory Visit: Payer: Self-pay | Admitting: Nurse Practitioner

## 2013-09-21 ENCOUNTER — Ambulatory Visit (INDEPENDENT_AMBULATORY_CARE_PROVIDER_SITE_OTHER): Payer: Medicare Other | Admitting: Internal Medicine

## 2013-09-21 ENCOUNTER — Encounter: Payer: Self-pay | Admitting: Internal Medicine

## 2013-09-21 ENCOUNTER — Ambulatory Visit (INDEPENDENT_AMBULATORY_CARE_PROVIDER_SITE_OTHER)
Admission: RE | Admit: 2013-09-21 | Discharge: 2013-09-21 | Disposition: A | Discharge status: Home / Self Care | Payer: Medicare Other | Source: Ambulatory Visit | Attending: Internal Medicine | Admitting: Internal Medicine

## 2013-09-21 VITALS — BP 102/60 | HR 71 | Temp 97.5°F | Ht 60.0 in | Wt 148.0 lb

## 2013-09-21 DIAGNOSIS — J8410 Pulmonary fibrosis, unspecified: Secondary | ICD-10-CM

## 2013-09-21 DIAGNOSIS — J9610 Chronic respiratory failure, unspecified whether with hypoxia or hypercapnia: Secondary | ICD-10-CM

## 2013-09-21 MED ORDER — VALSARTAN 320 MG PO TABS: 320.0000 mg | ORAL_TABLET | Freq: Every day | ORAL | Status: DC

## 2013-09-21 MED ORDER — ALPRAZOLAM 0.25 MG PO TABS
0.2500 mg | ORAL_TABLET | Freq: Three times a day (TID) | ORAL | Status: DC | PRN
Start: 2013-09-21 — End: 2013-10-31

## 2013-09-21 NOTE — Patient Instructions (Addendum)
Continue prednisone at 10 mg daily - ok to increase to 20 mg per day if worse - take with breakfast in am  Stop prinivil and double the diovan to 320 mg one daily   Please schedule a follow up office visit in 6 weeks, call sooner if needed

## 2013-09-21 NOTE — Assessment & Plan Note (Signed)
-   ESR 48  09/14/12 - PFT's 10/13/12    VC 1.19, no dlco done  - PFT's 06/20/2013  VC 1.07 and no airflow obst, DLCO 46 corrects to 81% - CT chest 06/23/13 >  Med and bilateral hilar adenopathy typical of sarcoid with no honecombing  07/04/13 ACEI =67/ esr 45 >   start Pred 07/05/13   She appears to have a partially steroid responsive form of fibrosis but doubt that this is typical sarcoid.  The goal with a chronic steroid dependent illness is always arriving at the lowest effective dose that controls the disease/symptoms and not accepting a set "formula" which is based on statistics or guidelines that don't always take into account patient  variability or the natural hx of the dz in every individual patient, which may well vary over time.  For now therefore I recommend the patient maintain  A ceiling of 20 mg per day and a floor of 10 mg    Each maintenance medication was reviewed in detail including most importantly the difference between maintenance and as needed and under what circumstances the prns are to be used.  Please see instructions for details which were reviewed in writing and the patient given a copy.

## 2013-09-21 NOTE — Progress Notes (Signed)
Subjective:    Patient ID: Lori [name], female    DOB: 1935-08-11   MRN: [id number]    Brief patient profile:  28 yowf never smoker no problems as child or adult with breathing " until after heart problems " dx with PF in 2009 placed on 02 in 2012 by Dr Daleen Squibb and first seen in pulmonary clinic 09/14/12.  Dr Cloyd Stagers Bonsu   HPI 09/14/2012 1st pulmonary office eval/ Wert with ild dating back to 2009 and sob since then never better than slow walking, maybe 50 ft, better since placed on 02 by Dr Daleen Squibb around one year prior to OV - better also on xopenex hfa but rarely uses it.  rec Only use your albuterol as a rescue medication to be used if you can't catch your breath   Please remember to go to the lab  > esr 48  10/13/2012 f/u ov/Wert cc no change doe on 3lpm, no change chronic orthopnea. rec Try pantoprazole  Take 30-60 min before first meal of the day and Pepcid 20 mg one bedtime until return GERD diet   11/29/2012 f/u ov/Wert cc no change doe on 3lpm 24 h per day. Stay on pantoprazole 40 mg one daily before bfast and pepcid 20 mg one bedtime Stay on 3lpm 24 h per day   03/18/2013 f/u ov/Wert re pf Chief Complaint  Patient presents with  . Follow-up    Breathing is unchanged since the last visit.    not as consistent with using 02 as rec but not limited from desidered activities by sob.  rec If cough gets worse or persists then we will need to consider getting you off prinivil - otherwise no changes needed   06/20/2013 f/u ov/Wert re PF Chief Complaint  Patient presents with  . Follow-up    Pt states breathing has unchanged, but spouse disagrees, stating that she is requiring more suplemental o2.   assoc with variable hoarseness and dry cough, day > night.  rec Stop lisinopril Start diovan 160 mg one daily in place of lisinopril Please see patient coordinator before you leave today  to schedule CT chest Please remember to go to the lab department downstairs for your tests -  we will call you with the results when they are available > did not go   06/30/2013 f/u ov/Wert re pf Chief Complaint  Patient presents with  . Follow-up    Pt states breathing unchanged since her last visit. No new c/o's today. Sats 77% 3lpm  when pt arrived- increased to   no cough at all. rec check ace level, consider fob vs empiric rx> level  67 so chose pred trial   07/05/13 started prednisone 10 mg 2 each am  07/12/2013 f/u ov/Wert/ prednisone 20 mg / 02 3lpm Chief Complaint  Patient presents with  . Follow-up    Breathing is better "sometimes."  Still has DOE.  No wheezing, chest tightness, chest pain, or cough.  usually could do HT pushing a cart but freq ride even on 02, chasing a toddler around the house even on 02 had been making her sob, both improving only 7 days into steroid rx Rec Continue prednisone dosed 2 each am with bfast, once breathing better reduce to one   08/09/2013 f/u ov/Wert re: pf/adenopathy/ 3lpm at rest and 4lpm walking  Chief Complaint  Patient presents with  . Follow-up    pt reports having good days and bad days, feels prednisone has overall improved her breathing-- denies any  other concerns at this time   reduced pred to 10 mg per day 7 day prior to OV   because  Breathing better rec Continue prednisone at 10 mg daily - ok to increase to 20 mg per day if worse - take with breakfast in am   09/21/2013 f/u ov/Wert re: ? Sarcoid/chronic resp failure Chief Complaint  Patient presents with  . Follow-up    Pt states breathing is doing about the same. She is currently on pred 10 mg daily, but has had to increase to 20 mg a few times since her last visit.    definitely improves sob on the 20 mg per day vs 10 and has more energy, less malaise, but generally maintains 10 mg per day dosing.     No obvious daytime variabilty or assoc cough  cp or chest tightness, subjective wheeze overt sinus or hb symptoms. No unusual exp hx .   Sleeping ok without  nocturnal  or early am exacerbation  of respiratory  c/o's or need for noct saba. Also denies any obvious fluctuation of symptoms with weather or environmental changes or other aggravating or alleviating factors except as outlined above     Current Medications, Allergies, Past Medical History, Past Surgical History, Family History, and Social History were reviewed in [employer] record.  ROS  The following are not active complaints unless bolded sore throat, dysphagia, dental problems, itching, sneezing,  nasal congestion or excess/ purulent secretions, ear ache,   fever, chills, sweats, unintended wt loss, pleuritic or exertional cp, hemoptysis,  orthopnea pnd or leg swelling, presyncope, palpitations, heartburn, abdominal pain, anorexia, nausea, vomiting, diarrhea  or change in bowel or urinary habits, change in stools or urine, dysuria,hematuria,  rash, arthralgias, visual complaints, headache, numbness weakness or ataxia or problems with walking or coordination,  change in mood/affect or memory.                  Objective:   Physical Exam  Elderly wf in w/c on 3lpm   Wt 152 10/13/2012 > 148 11/29/2012> 145 03/18/2013 > 148  06/20/13 > 06/30/2013 142 > 07/12/2013  145 > 150 08/09/2013  > 09/21/2013  148   HEENT: nl dentition, turbinates, and orophanx. Nl external ear canals without cough reflex   NECK :  without JVD/Nodes/TM/ nl carotid upstrokes bilaterally   LUNGS: no acc muscle use, min  insp crackles both bases   CV:  RRR  no s3 or murmur or increase in P2, no edema   ABD:  soft and nontender with nl excursion in the supine position. No bruits or organomegaly, bowel sounds nl  MS:  warm without deformities, calf tenderness, cyanosis or clubbing    CXR  09/21/2013 :   1. Increasing conspicuity of a nodular opacity in the right upper lung over the past several studies. Differential considerations include progressive pleural parenchymal scarring/developing  massive pulmonary fibrosis versus developing pulmonary nodule. Consider further evaluation with CT scan of the chest if clinically warranted. 2. Grossly stable peripherally predominant interstitial disease/ fibrosis compared to January of 2014. There has been clear interval progression compared to September 2013.           Assessment & Plan:

## 2013-09-21 NOTE — Assessment & Plan Note (Signed)
-   PFT's 10/13/12  VC 2.09 no obst and no dLCO recorded    - 09/14/2012   Walked 3lpm x one lap @ 185 stopped due to  89%     - 10/13/2012   Walked 3lpm x two laps @ 185 stopped due to sob with sats 88%    - 11/29/2012  Walked 3lpm  x 2 laps @ 185 ft each stopped due to sob with sats 89%   - 03/18/13 walked 3lpm x 1 lap and stopped due to sob, sats 86%    -06/30/2013   Walked  4lpm x one lap @ 185 stopped due to  87%    - 08/09/13    Walked 4lpmx 2  lap @ 185 stopped due to  desat with sats 90% p one lap   rx 3lpm x 4lpm with activity   Adequate control on present rx, reviewed > no change in rx needed

## 2013-10-03 ENCOUNTER — Other Ambulatory Visit: Payer: Self-pay | Admitting: Internal Medicine

## 2013-10-31 ENCOUNTER — Telehealth: Payer: Self-pay | Admitting: Internal Medicine

## 2013-10-31 MED ORDER — FUROSEMIDE 40 MG PO TABS: ORAL_TABLET | ORAL | Status: AC

## 2013-10-31 MED ORDER — PREDNISONE 10 MG PO TABS: ORAL_TABLET | ORAL | Status: DC

## 2013-10-31 MED ORDER — ALPRAZOLAM 0.25 MG PO TABS: 0.2500 mg | ORAL_TABLET | Freq: Three times a day (TID) | ORAL | Status: DC | PRN

## 2013-10-31 NOTE — Telephone Encounter (Signed)
Ok to refill x one 

## 2013-10-31 NOTE — Telephone Encounter (Signed)
I have called RX into [facility]. I called pt and nothing further needed

## 2013-10-31 NOTE — Telephone Encounter (Signed)
I spoke with pt. She is needing a refill on her alprazolam 0.25 mg. Last refilled 09/21/13 #30 x 0 refills. Pt had to cancel pending appt wed d.t family emergency. Please advise MW thanks

## 2013-11-01 ENCOUNTER — Other Ambulatory Visit: Payer: Self-pay | Admitting: Internal Medicine

## 2013-11-02 ENCOUNTER — Ambulatory Visit: Payer: Medicare Other | Admitting: Internal Medicine

## 2013-12-06 ENCOUNTER — Other Ambulatory Visit: Payer: Self-pay | Admitting: Cardiology

## 2013-12-06 ENCOUNTER — Other Ambulatory Visit: Payer: Self-pay | Admitting: Internal Medicine

## 2013-12-26 ENCOUNTER — Other Ambulatory Visit: Payer: Self-pay | Admitting: *Deleted

## 2013-12-26 ENCOUNTER — Telehealth: Payer: Self-pay | Admitting: Internal Medicine

## 2013-12-26 MED ORDER — POTASSIUM CHLORIDE CRYS ER 20 MEQ PO TBCR: EXTENDED_RELEASE_TABLET | ORAL | Status: AC

## 2013-12-26 MED ORDER — METOPROLOL TARTRATE 50 MG PO TABS: 50.0000 mg | ORAL_TABLET | Freq: Two times a day (BID) | ORAL | Status: AC

## 2013-12-26 NOTE — Telephone Encounter (Signed)
LMTFCB

## 2013-12-28 NOTE — Telephone Encounter (Signed)
LMTCBx2. Jennifer Castillo, CMA  

## 2013-12-29 NOTE — Telephone Encounter (Signed)
LMTCBx3. [name], CMA  

## 2013-12-30 NOTE — Telephone Encounter (Signed)
Per protocol I will sign off on message. Jennifer Castillo, CMA  

## 2014-03-13 ENCOUNTER — Other Ambulatory Visit: Payer: Self-pay | Admitting: Internal Medicine

## 2014-04-28 ENCOUNTER — Inpatient Hospital Stay (HOSPITAL_COMMUNITY)
Admission: EM | Admit: 2014-04-28 | Discharge: 2014-05-04 | DRG: 291 | Disposition: A | Discharge status: Skilled Nursing Facility | Payer: Medicare HMO | Attending: Internal Medicine | Admitting: Internal Medicine

## 2014-04-28 ENCOUNTER — Encounter (HOSPITAL_COMMUNITY): Payer: Self-pay | Admitting: Emergency Medicine

## 2014-04-28 ENCOUNTER — Emergency Department (HOSPITAL_COMMUNITY): Payer: Medicare HMO

## 2014-04-28 ENCOUNTER — Ambulatory Visit (INDEPENDENT_AMBULATORY_CARE_PROVIDER_SITE_OTHER): Payer: Medicare HMO | Admitting: Physician Assistant

## 2014-04-28 ENCOUNTER — Encounter: Payer: Self-pay | Admitting: Physician Assistant

## 2014-04-28 ENCOUNTER — Observation Stay (HOSPITAL_COMMUNITY): Admission: AD | Admit: 2014-04-28 | Payer: Self-pay | Source: Ambulatory Visit | Admitting: Cardiovascular Disease

## 2014-04-28 VITALS — BP 90/42 | HR 75 | Ht 60.0 in | Wt 143.0 lb

## 2014-04-28 DIAGNOSIS — Z79899 Other long term (current) drug therapy: Secondary | ICD-10-CM

## 2014-04-28 DIAGNOSIS — R0989 Other specified symptoms and signs involving the circulatory and respiratory systems: Secondary | ICD-10-CM

## 2014-04-28 DIAGNOSIS — Z91199 Patient's noncompliance with other medical treatment and regimen due to unspecified reason: Secondary | ICD-10-CM

## 2014-04-28 DIAGNOSIS — I2789 Other specified pulmonary heart diseases: Secondary | ICD-10-CM | POA: Diagnosis present

## 2014-04-28 DIAGNOSIS — I5032 Chronic diastolic (congestive) heart failure: Secondary | ICD-10-CM

## 2014-04-28 DIAGNOSIS — I200 Unstable angina: Secondary | ICD-10-CM | POA: Insufficient documentation

## 2014-04-28 DIAGNOSIS — R079 Chest pain, unspecified: Secondary | ICD-10-CM

## 2014-04-28 DIAGNOSIS — F3289 Other specified depressive episodes: Secondary | ICD-10-CM

## 2014-04-28 DIAGNOSIS — Z9119 Patient's noncompliance with other medical treatment and regimen: Secondary | ICD-10-CM

## 2014-04-28 DIAGNOSIS — R011 Cardiac murmur, unspecified: Secondary | ICD-10-CM

## 2014-04-28 DIAGNOSIS — K921 Melena: Secondary | ICD-10-CM

## 2014-04-28 DIAGNOSIS — Z9981 Dependence on supplemental oxygen: Secondary | ICD-10-CM

## 2014-04-28 DIAGNOSIS — I359 Nonrheumatic aortic valve disorder, unspecified: Secondary | ICD-10-CM

## 2014-04-28 DIAGNOSIS — R300 Dysuria: Secondary | ICD-10-CM

## 2014-04-28 DIAGNOSIS — R55 Syncope and collapse: Secondary | ICD-10-CM

## 2014-04-28 DIAGNOSIS — Z7982 Long term (current) use of aspirin: Secondary | ICD-10-CM

## 2014-04-28 DIAGNOSIS — I2781 Cor pulmonale (chronic): Secondary | ICD-10-CM | POA: Diagnosis present

## 2014-04-28 DIAGNOSIS — R0609 Other forms of dyspnea: Secondary | ICD-10-CM

## 2014-04-28 DIAGNOSIS — Z0389 Encounter for observation for other suspected diseases and conditions ruled out: Secondary | ICD-10-CM

## 2014-04-28 DIAGNOSIS — F411 Generalized anxiety disorder: Secondary | ICD-10-CM | POA: Diagnosis present

## 2014-04-28 DIAGNOSIS — R0600 Dyspnea, unspecified: Secondary | ICD-10-CM

## 2014-04-28 DIAGNOSIS — D649 Anemia, unspecified: Secondary | ICD-10-CM

## 2014-04-28 DIAGNOSIS — I10 Essential (primary) hypertension: Secondary | ICD-10-CM | POA: Diagnosis present

## 2014-04-28 DIAGNOSIS — F329 Major depressive disorder, single episode, unspecified: Secondary | ICD-10-CM

## 2014-04-28 DIAGNOSIS — I4510 Unspecified right bundle-branch block: Secondary | ICD-10-CM | POA: Diagnosis present

## 2014-04-28 DIAGNOSIS — IMO0001 Reserved for inherently not codable concepts without codable children: Secondary | ICD-10-CM

## 2014-04-28 DIAGNOSIS — I2510 Atherosclerotic heart disease of native coronary artery without angina pectoris: Secondary | ICD-10-CM

## 2014-04-28 DIAGNOSIS — J9610 Chronic respiratory failure, unspecified whether with hypoxia or hypercapnia: Secondary | ICD-10-CM

## 2014-04-28 DIAGNOSIS — J189 Pneumonia, unspecified organism: Secondary | ICD-10-CM | POA: Diagnosis present

## 2014-04-28 DIAGNOSIS — M810 Age-related osteoporosis without current pathological fracture: Secondary | ICD-10-CM

## 2014-04-28 DIAGNOSIS — I5033 Acute on chronic diastolic (congestive) heart failure: Secondary | ICD-10-CM | POA: Diagnosis present

## 2014-04-28 DIAGNOSIS — R918 Other nonspecific abnormal finding of lung field: Secondary | ICD-10-CM

## 2014-04-28 DIAGNOSIS — R197 Diarrhea, unspecified: Secondary | ICD-10-CM | POA: Diagnosis not present

## 2014-04-28 DIAGNOSIS — R7989 Other specified abnormal findings of blood chemistry: Secondary | ICD-10-CM | POA: Diagnosis present

## 2014-04-28 DIAGNOSIS — I509 Heart failure, unspecified: Secondary | ICD-10-CM | POA: Diagnosis present

## 2014-04-28 DIAGNOSIS — R778 Other specified abnormalities of plasma proteins: Secondary | ICD-10-CM | POA: Diagnosis present

## 2014-04-28 DIAGNOSIS — K219 Gastro-esophageal reflux disease without esophagitis: Secondary | ICD-10-CM | POA: Diagnosis present

## 2014-04-28 DIAGNOSIS — I50810 Right heart failure, unspecified: Secondary | ICD-10-CM | POA: Diagnosis present

## 2014-04-28 DIAGNOSIS — J8410 Pulmonary fibrosis, unspecified: Secondary | ICD-10-CM | POA: Diagnosis present

## 2014-04-28 DIAGNOSIS — I5020 Unspecified systolic (congestive) heart failure: Secondary | ICD-10-CM

## 2014-04-28 DIAGNOSIS — I2729 Other secondary pulmonary hypertension: Secondary | ICD-10-CM | POA: Diagnosis present

## 2014-04-28 DIAGNOSIS — G4733 Obstructive sleep apnea (adult) (pediatric): Secondary | ICD-10-CM

## 2014-04-28 HISTORY — DX: Gastro-esophageal reflux disease without esophagitis: K21.9

## 2014-04-28 HISTORY — DX: Chronic respiratory failure, unspecified whether with hypoxia or hypercapnia: J96.10

## 2014-04-28 LAB — BASIC METABOLIC PANEL
BUN: 18 mg/dL (ref 6–23)
CO2: 28 meq/L (ref 19–32)
Calcium: 9.2 mg/dL (ref 8.4–10.5)
Chloride: 99 mEq/L (ref 96–112)
Creatinine, Ser: 0.83 mg/dL (ref 0.50–1.10)
GFR calc Af Amer: 77 mL/min — ABNORMAL LOW (ref >90)
GFR calc non Af Amer: 66 mL/min — ABNORMAL LOW (ref >90)
Glucose, Bld: 88 mg/dL (ref 70–99)
Potassium: 4.6 mEq/L (ref 3.7–5.3)
SODIUM: 138 meq/L (ref 137–147)

## 2014-04-28 LAB — I-STAT TROPONIN, ED
TROPONIN I, POC: 0.16 ng/mL — AB (ref 0.00–0.08)
Troponin i, poc: 0.19 ng/mL (ref 0.00–0.08)

## 2014-04-28 LAB — CBC
HCT: 39.9 % (ref 36.0–46.0)
HEMOGLOBIN: 13 g/dL (ref 12.0–15.0)
MCH: 29.7 pg (ref 26.0–34.0)
MCHC: 32.6 g/dL (ref 30.0–36.0)
MCV: 91.1 fL (ref 78.0–100.0)
Platelets: 289 10*3/uL (ref 150–400)
RBC: 4.38 MIL/uL (ref 3.87–5.11)
RDW: 14.3 % (ref 11.5–15.5)
WBC: 12 10*3/uL — ABNORMAL HIGH (ref 4.0–10.5)

## 2014-04-28 LAB — PRO B NATRIURETIC PEPTIDE: Pro B Natriuretic peptide (BNP): 3118 pg/mL — ABNORMAL HIGH (ref 0–450)

## 2014-04-28 LAB — TROPONIN I

## 2014-04-28 MED ORDER — ALBUTEROL SULFATE (2.5 MG/3ML) 0.083% IN NEBU: 2.5000 mg | INHALATION_SOLUTION | RESPIRATORY_TRACT | Status: DC | PRN

## 2014-04-28 MED ORDER — OXYCODONE HCL 5 MG PO TABS
5.0000 mg | ORAL_TABLET | ORAL | Status: DC | PRN
  Administered 2014-04-28 – 2014-04-30 (×3): 5 mg via ORAL
  Filled 2014-04-28 (×3): qty 1

## 2014-04-28 MED ORDER — MORPHINE SULFATE 2 MG/ML IJ SOLN: 2.0000 mg | INTRAMUSCULAR | Status: DC | PRN

## 2014-04-28 MED ORDER — GUAIFENESIN ER 600 MG PO TB12
600.0000 mg | ORAL_TABLET | Freq: Two times a day (BID) | ORAL | Status: DC
  Administered 2014-04-29 – 2014-05-04 (×11): 600 mg via ORAL
  Filled 2014-04-28 (×13): qty 1

## 2014-04-28 MED ORDER — SODIUM CHLORIDE 0.9 % IJ SOLN: 3.0000 mL | Freq: Two times a day (BID) | INTRAMUSCULAR | Status: DC

## 2014-04-28 MED ORDER — SODIUM CHLORIDE 0.9 % IJ SOLN: 3.0000 mL | INTRAMUSCULAR | Status: DC | PRN

## 2014-04-28 MED ORDER — FAMOTIDINE 20 MG PO TABS
20.0000 mg | ORAL_TABLET | Freq: Every day | ORAL | Status: DC
  Administered 2014-04-29 – 2014-04-30 (×2): 20 mg via ORAL
  Filled 2014-04-28 (×3): qty 1

## 2014-04-28 MED ORDER — FUROSEMIDE 10 MG/ML IJ SOLN
40.0000 mg | Freq: Once | INTRAMUSCULAR | Status: AC
  Administered 2014-04-28: 40 mg via INTRAVENOUS
  Filled 2014-04-28: qty 4

## 2014-04-28 MED ORDER — ACETAMINOPHEN 650 MG RE SUPP: 650.0000 mg | Freq: Four times a day (QID) | RECTAL | Status: DC | PRN

## 2014-04-28 MED ORDER — ASPIRIN 325 MG PO TABS
325.0000 mg | ORAL_TABLET | Freq: Once | ORAL | Status: AC
  Administered 2014-04-28: 325 mg via ORAL
  Filled 2014-04-28: qty 1

## 2014-04-28 MED ORDER — SODIUM CHLORIDE 0.9 % IJ SOLN
3.0000 mL | Freq: Two times a day (BID) | INTRAMUSCULAR | Status: DC
  Administered 2014-04-29 – 2014-05-04 (×9): 3 mL via INTRAVENOUS

## 2014-04-28 MED ORDER — FUROSEMIDE 10 MG/ML IJ SOLN
40.0000 mg | Freq: Two times a day (BID) | INTRAMUSCULAR | Status: DC
  Administered 2014-04-29 – 2014-04-30 (×3): 40 mg via INTRAVENOUS
  Filled 2014-04-28 (×5): qty 4

## 2014-04-28 MED ORDER — ALPRAZOLAM 0.25 MG PO TABS
0.2500 mg | ORAL_TABLET | Freq: Three times a day (TID) | ORAL | Status: DC | PRN
  Administered 2014-04-28 – 2014-05-03 (×7): 0.25 mg via ORAL
  Filled 2014-04-28 (×7): qty 1

## 2014-04-28 MED ORDER — ASPIRIN 325 MG PO TABS
325.0000 mg | ORAL_TABLET | Freq: Every day | ORAL | Status: DC
  Administered 2014-04-29 – 2014-04-30 (×2): 325 mg via ORAL
  Filled 2014-04-28 (×3): qty 1

## 2014-04-28 MED ORDER — DEXTROSE 5 % IV SOLN
500.0000 mg | INTRAVENOUS | Status: DC
  Administered 2014-04-29: 500 mg via INTRAVENOUS
  Filled 2014-04-28 (×2): qty 500

## 2014-04-28 MED ORDER — ACETAMINOPHEN 325 MG PO TABS: 650.0000 mg | ORAL_TABLET | Freq: Four times a day (QID) | ORAL | Status: DC | PRN

## 2014-04-28 MED ORDER — HEPARIN BOLUS VIA INFUSION
3000.0000 [IU] | Freq: Once | INTRAVENOUS | Status: AC
  Administered 2014-04-28: 3000 [IU] via INTRAVENOUS
  Filled 2014-04-28: qty 3000

## 2014-04-28 MED ORDER — IRBESARTAN 75 MG PO TABS
75.0000 mg | ORAL_TABLET | Freq: Every day | ORAL | Status: DC
  Administered 2014-04-29 – 2014-05-02 (×4): 75 mg via ORAL
  Filled 2014-04-28 (×5): qty 1

## 2014-04-28 MED ORDER — DEXTROSE 5 % IV SOLN
1.0000 g | INTRAVENOUS | Status: DC
  Administered 2014-04-29 – 2014-04-30 (×2): 1 g via INTRAVENOUS
  Filled 2014-04-28 (×4): qty 10

## 2014-04-28 MED ORDER — ONDANSETRON HCL 4 MG/2ML IJ SOLN: 4.0000 mg | Freq: Four times a day (QID) | INTRAMUSCULAR | Status: DC | PRN

## 2014-04-28 MED ORDER — SODIUM CHLORIDE 0.9 % IV SOLN: 250.0000 mL | INTRAVENOUS | Status: DC | PRN

## 2014-04-28 MED ORDER — NITROGLYCERIN 0.4 MG SL SUBL
0.4000 mg | SUBLINGUAL_TABLET | SUBLINGUAL | Status: DC | PRN
Start: 2014-04-28 — End: 2014-05-04
  Filled 2014-04-28: qty 1

## 2014-04-28 MED ORDER — IOHEXOL 350 MG/ML SOLN
100.0000 mL | Freq: Once | INTRAVENOUS | Status: AC | PRN
  Administered 2014-04-28: 100 mL via INTRAVENOUS

## 2014-04-28 MED ORDER — METOPROLOL TARTRATE 50 MG PO TABS
50.0000 mg | ORAL_TABLET | Freq: Two times a day (BID) | ORAL | Status: DC
  Administered 2014-04-28 – 2014-05-04 (×12): 50 mg via ORAL
  Filled 2014-04-28 (×4): qty 1
  Filled 2014-04-28: qty 2
  Filled 2014-04-28 (×8): qty 1

## 2014-04-28 MED ORDER — DEXTROSE 5 % IV SOLN
1.0000 g | Freq: Once | INTRAVENOUS | Status: AC
  Administered 2014-04-28: 1 g via INTRAVENOUS
  Filled 2014-04-28: qty 10

## 2014-04-28 MED ORDER — HEPARIN (PORCINE) IN NACL 100-0.45 UNIT/ML-% IJ SOLN
1050.0000 [IU]/h | INTRAMUSCULAR | Status: DC
  Administered 2014-04-28: 750 [IU]/h via INTRAVENOUS
  Administered 2014-04-29: 950 [IU]/h via INTRAVENOUS
  Administered 2014-04-29: 1150 [IU]/h via INTRAVENOUS
  Administered 2014-04-30: 1050 [IU]/h via INTRAVENOUS
  Filled 2014-04-28 (×3): qty 250

## 2014-04-28 MED ORDER — DEXTROSE 5 % IV SOLN
500.0000 mg | Freq: Once | INTRAVENOUS | Status: AC
  Administered 2014-04-28: 500 mg via INTRAVENOUS
  Filled 2014-04-28: qty 500

## 2014-04-28 MED ORDER — ONDANSETRON HCL 4 MG PO TABS: 4.0000 mg | ORAL_TABLET | Freq: Four times a day (QID) | ORAL | Status: DC | PRN

## 2014-04-28 MED ORDER — PANTOPRAZOLE SODIUM 20 MG PO TBEC
20.0000 mg | DELAYED_RELEASE_TABLET | Freq: Every day | ORAL | Status: DC
  Administered 2014-04-29 – 2014-05-04 (×6): 20 mg via ORAL
  Filled 2014-04-28 (×6): qty 1

## 2014-04-28 NOTE — H&P (Addendum)
[facility]                                                                                    Patient Demographics  Lori [name], is a 79 y.o. female  CSN: [id number]  MRN: [id number]  DOB - Feb 11, 1935  Admit Date - 04/28/2014  Outpatient Primary MD for the patient is OSEI-BONSU,GEORGE, MD   With History of -  Past Medical History  Diagnosis Date  . RBBB   . HYPERTENSION   . DIASTOLIC HEART FAILURE, CHRONIC     a. 05/2008 Echo: EF 55-60%, Diast Dysfxn, mild-mod AI, triv MR;  b. 07/2012 Echo: EF 60-65%, Gr 1 DD, Mid AI.  Marland Kitchen. AORTIC INSUFFICIENCY     a. mild-mod by echo 05/2008;  b. mild by echo 07/2012  . OSTEOPOROSIS   . DEPRESSION   . Chronic lung disease     a. on home O2;  b. 07/2012 CTA chest: chronic sev lung dzs, similar to 2009 CT, stable R paratracheal nodes.  . OBSTRUCTIVE SLEEP APNEA     pt denies this hx (08/17/2012) - though previously seen by Dr. Shelle Ironlance for this Dx.  . Daily headache   . Arthritis     "knees" (08/17/2012)  . Anxiety   . Depression   . Aortic valve disorders   . Respiratory failure, chronic   . GERD (gastroesophageal reflux disease)       Past Surgical History  Procedure Laterality Date  . Rhinoplasty  1953    "broke it"    in for   Chief Complaint  Patient presents with  . Shortness of Breath     HPI  Lori [name]  is a 79 y.o. female, no history of hypertension diastolic congestive heart failure presenting today with 1-2 months history of shortness of breath worsening and pressure like chest pain episodes on and off. For financial reasons the patient did not refill her Lasix, and she went to her cardiologist today who will give her the Lasix prescription and did an EKG and ask her to go to the emergency room after examination to be evaluated. Her EKG was abnormal with worsening T-wave inversions when compared to prior EKGs. The patient reports chills at times but no fever no nausea or vomiting. She has chronic diarrhea.  In the emergency room her troponin was elevated at 0.19, and I was called to admit.    Review of Systems    In addition to the HPI above,  No Headache, No changes with Vision or hearing, No problems swallowing food or Liquids, No Abdominal pain, No Nausea or Vommitting, Bowel movements are regular, No Blood in stool or Urine, No dysuria, No new skin rashes or bruises, No new joints pains-aches,  No new weakness, tingling, numbness in any extremity, No recent weight gain or loss, No polyuria, polydypsia or polyphagia, No significant Mental Stressors.  A full 10 point Review of Systems was done, except as stated above, all other Review of Systems were negative.   Social History History  Substance Use Topics  . Smoking status: Never Smoker   . Smokeless tobacco: Never Used  . Alcohol Use: 4.2 oz/week  7 Glasses of wine per week     Family History Family History  Problem Relation Age of Onset  . Heart disease Sister   . Cancer Father     died in his 79's  . Other Mother     died @ 82 - "old age"  . Stroke Sister     deceased  . Cancer Brother     deceased     Prior to Admission medications   Medication Sig Start Date End Date Taking? Authorizing Provider  ALPRAZolam (XANAX) 0.25 MG tablet Take 1 tablet (0.25 mg total) by mouth every 8 (eight) hours as needed for anxiety. 10/31/13  Yes Nyoka Cowden, MD  ascorbic acid (VITAMIN C) 500 MG tablet Take 500 mg by mouth daily.   Yes Historical Provider, MD  aspirin 81 MG tablet Take 325 mg by mouth daily. 08/19/12  Yes Ok Anis, NP  Cholecalciferol (VITAMIN D-3) 1000 UNITS CAPS Take 1 capsule by mouth daily.   Yes Historical Provider, MD  famotidine (PEPCID) 20 MG tablet Take 20 mg by mouth daily.   Yes Historical Provider, MD  furosemide (LASIX) 40 MG tablet TAKE 1 TABLET (40 MG TOTAL) BY MOUTH DAILY. 10/31/13  Yes Nyoka Cowden, MD  metoprolol (LOPRESSOR) 50 MG tablet Take 1 tablet (50 mg total) by mouth 2  (two) times daily. 12/26/13  Yes Lars Masson, MD  nitroGLYCERIN (NITROSTAT) 0.4 MG SL tablet Place 1 tablet (0.4 mg total) under the tongue every 5 (five) minutes as needed. For chest pain 06/20/13  Yes Nyoka Cowden, MD  pantoprazole (PROTONIX) 40 MG tablet take 1 tablet by mouth daily before BREAKFAST 12/06/13  Yes Nyoka Cowden, MD  potassium chloride SA (K-DUR,KLOR-CON) 20 MEQ tablet TAKE 1 TABLET (20 MEQ TOTAL) BY MOUTH DAILY. 12/26/13  Yes Lars Masson, MD  valsartan (DIOVAN) 160 MG tablet Take 160 mg by mouth daily.   Yes Historical Provider, MD    No Known Allergies  Physical Exam  Vitals  Blood pressure 124/68, pulse 80, temperature 97.5 F (36.4 C), temperature source Oral, resp. rate 18, height 5' (1.524 m), weight 64.864 kg (143 lb), SpO2 92.00%.   1. General elderly white female looks chronically ill, hard of hearing, very pleasant  2. Normal affect and insight, Not Suicidal or Homicidal, Awake Alert, Oriented X 3.  3. No F.N deficits, ALL C.Nerves Intact, Strength 5/5 all 4 extremities, Sensation intact all 4 extremities, Plantars down going.  4. Ears and Eyes appear Normal, Conjunctivae clear,  5. Supple Neck, No JVD, No cervical lymphadenopathy appriciated, No Carotid Bruits.  6. Symmetrical Chest wall movement, decreased breath sounds bilaterally with basilar crackles  7. Heart normal S1-S2 with diastolic murmur . Regular rate and rhythm  8. Positive Bowel Sounds, Abdomen Soft, Non tender, No organomegaly appriciated,No rebound -guarding or rigidity.  9.  No Cyanosis, Normal Skin Turgor, No Skin Rash or Bruise.  10. Good muscle tone,  joints appear normal , no effusions, Normal ROM.  11. No Palpable Lymph Nodes in Neck or Axillae    Data Review  CBC  Recent Labs Lab 04/28/14 1523  WBC 12.0*  HGB 13.0  HCT 39.9  PLT 289  MCV 91.1  MCH 29.7  MCHC 32.6  RDW 14.3    ------------------------------------------------------------------------------------------------------------------  Chemistries   Recent Labs Lab 04/28/14 1523  NA 138  K 4.6  CL 99  CO2 28  GLUCOSE 88  BUN 18  CREATININE 0.83  CALCIUM  9.2   ------------------------------------------------------------------------------------------------------------------ estimated creatinine clearance is 47.8 ml/min (by C-G formula based on Cr of 0.83). ------------------------------------------------------------------------------------------------------------------ No results found for this basename: TSH, T4TOTAL, FREET3, T3FREE, THYROIDAB,  in the last 72 hours   Coagulation profile No results found for this basename: INR, PROTIME,  in the last 168 hours ------------------------------------------------------------------------------------------------------------------- No results found for this basename: DDIMER,  in the last 72 hours -------------------------------------------------------------------------------------------------------------------  Cardiac Enzymes No results found for this basename: CK, CKMB, TROPONINI, MYOGLOBIN,  in the last 168 hours ------------------------------------------------------------------------------------------------------------------ No components found with this basename: POCBNP,    ---------------------------------------------------------------------------------------------------------------  Urinalysis    Component Value Date/Time   COLORURINE YELLOW 08/18/2012 1259   APPEARANCEUR CLEAR 08/18/2012 1259   LABSPEC 1.005 08/18/2012 1259   PHURINE 6.0 08/18/2012 1259   GLUCOSEU NEGATIVE 08/18/2012 1259   HGBUR NEGATIVE 08/18/2012 1259   BILIRUBINUR NEGATIVE 08/18/2012 1259   KETONESUR NEGATIVE 08/18/2012 1259   PROTEINUR NEGATIVE 08/18/2012 1259   UROBILINOGEN 0.2 08/18/2012 1259   NITRITE NEGATIVE 08/18/2012 1259   LEUKOCYTESUR MODERATE* 08/18/2012 1259     ----------------------------------------------------------------------------------------------------------------     Imaging results:   Ct Angio Chest Pe W/cm &/or Wo Cm  04/28/2014   CLINICAL DATA:  Chest pressure and shortness of Breath  EXAM: CT ANGIOGRAPHY CHEST WITH CONTRAST  TECHNIQUE: Multidetector CT imaging of the chest was performed using the standard protocol during bolus administration of intravenous contrast. Multiplanar CT image reconstructions and MIPs were obtained to evaluate the vascular anatomy.  CONTRAST:  OMNIPAQUE IOHEXOL 350 MG/ML SOLN  COMPARISON:  Chest radiograph April 28, 2014 and chest CT angiogram August 18, 2012  FINDINGS: There is no demonstrable pulmonary embolus. There is no thoracic aortic aneurysm or dissection. The right subclavian artery has anaberrant course, passing posterior to the esophagus.  There is underlying emphysematous change. There is extensive interstitial fibrosis bilaterally with multiple areas of patchy consolidation throughout both lungs, a finding that was also present previously. In comparison with the previous study from 2013, there is new opacity in the right upper lobe anterior segment.  There are remains generalized soft tissue thickening in the mediastinum to the left of the trachea extending from the upper thoracic trachea on the left to the level of the carinal, stable. There are multiple lymph nodes in this area, stable. No new lymph adenopathy is appreciable on this study.  Heart is mildly enlarged. There is slight pericardial thickening which is stable.  Visualized upper abdominal structures appear unremarkable. There are no blastic or lytic bone lesions. There is degenerative change in the thoracic spine. Thyroid appears unremarkable.  Review of the MIP images confirms the above findings.  IMPRESSION: No demonstrable pulmonary embolus.  Extensive lung fibrosis with underlying emphysema. In comparison with the prior study, there is  new airspace consolidation in portions of the right upper lobe, primarily in the anterior segment. These areas have an appearance more suspicious for pneumonia than mass, although a mass could easily be obscured by the degree of consolidation and fibrosis in this area. In this regard, would advise followup study in 3-4 weeks to further assess. If this area persists without change at that time, it would be entirely reasonable and advisable to consider PET-CT with particular attention to the right upper lobe region.  Thoracic adenopathy is stable. No new soft tissue thickening or lymph node prominence is seen in the mediastinum.  Heart is enlarged with mild pericardial thickening, stable.   Electronically Signed   By: Bretta Bang M.D.  On: 04/28/2014 17:34   Dg Chest Port 1 View  04/28/2014   CLINICAL DATA:  Shortness of breath.  EXAM: PORTABLE CHEST - 1 VIEW  COMPARISON:  Chest x-ray 09/21/2013.  FINDINGS: Again noted is a background of diffuse interstitial prominence. However, compared to most recent prior examinations, there is worsening confluent opacity throughout the left mid to lower lung. Persistent somewhat nodular opacity in the right upper lobe, which is increasingly conspicuous over the past several examinations. No definite pleural effusions. Findings are not favored to reflect edema. Heart size does appear mildly enlarged. Upper mediastinal contours are distorted by patient's rotation to the left.  IMPRESSION: 1. Although there is clearly a background of underlying interstitial lung disease, today's study demonstrates new airspace consolidation throughout the left mid to lower lung, obscuring left heart border, likely within both the lingula and left lower lobe, concerning for multilobar pneumonia. 2. Increasing conspicuity of nodular opacity in the periphery of the right upper lobe over prior examinations, concerning for potential neoplasm. 3. For further evaluation of all these findings, a  followup high-resolution chest CT is suggested in the near future.   Electronically Signed   By: Trudie Reed M.D.   On: 04/28/2014 15:27    My personal review of EKG: Incomplete bundle branch block with T-wave inversions in the inferior leads and anterolateral leads    Assessment & Plan  1. unstable angina; continue with aspirin and beta blocker     Heparin IV     Follow serial troponins     Check EKG in a.m.     Cardiology consult in a.m. 2. Congestive heart failure     Change Lasix to IV      Fluid restriction     Check echo in a.m.     Continue home medications 3. Hypertension     Continue home medications 4. Community-acquired pneumonia     Continue Rocephin and Zithromax     Sputum Gram stain culture &sensitivity    DVT Prophylaxis Heparin   AM Labs Ordered, also please review Full Orders  Family Communication: Admission, patients condition and plan of care including tests being ordered have been discussed with the patient and husband and daughter-in-law who indicate understanding and agree with the plan and Code Status.  Code Status full  Disposition Plan: Home  Time spent in minutes : 32 minutes  Condition GUARDED   @SIGNATURE @

## 2014-04-28 NOTE — ED Notes (Signed)
Pt now advising she is having chest pain.  Family made RN aware. Another EKG done and vitals rechecked.

## 2014-04-28 NOTE — ED Notes (Signed)
Attempted to give report 

## 2014-04-28 NOTE — Progress Notes (Addendum)
Cardiology Office Note   Date:  04/28/2014   ID:  Lori [name], DOB [birth year], MRN [id number]  PCP:  Jackie PlumSEI-BONSU,GEORGE, MD  Cardiologist:  Dr. Valera Castlehomas Wall => Dr. Tobias AlexanderKatarina Nelson     History of Present Illness: Lori [name] is a 79 y.o. female with a hx of diastolic CHF, HTN, AI, chronic respiratory failure and pulmonary fibrosis (followed by Dr. Sherene SiresWert).  Last seen by Dr. Valera Castlehomas Wall 06/2013.    She presents today with worsening dyspnea over the last several weeks.  She ran out of Lasix a month ago.  She feels like her legs are swollen.  She reports 3 pillow orthopnea.  Denies PND.  She is having constant L sided chest pain that is pressure like.  She has some radiation to her L arm.  CP has been present for 2 weeks.     Studies:  - LHC (06/2000):  No CAD. EF 70%.  - Echo (07/2012):  EF 60% to 65%. Wall motion was normal.  Grade 1 diastolic Dysfunction.  Mild AI.  MAC.   Recent Labs: 07/04/2013: Hemoglobin 14.4; Pro B Natriuretic peptide (BNP) 119.0*   Wt Readings from Last 3 Encounters:  04/28/14 143 lb (64.864 kg)  09/21/13 148 lb (67.132 kg)  08/09/13 150 lb 6.4 oz (68.221 kg)     Past Medical History  Diagnosis Date  . RBBB   . HYPERTENSION   . DIASTOLIC HEART FAILURE, CHRONIC     a. 05/2008 Echo: EF 55-60%, Diast Dysfxn, mild-mod AI, triv MR;  b. 07/2012 Echo: EF 60-65%, Gr 1 DD, Mid AI.  Marland Kitchen. AORTIC INSUFFICIENCY     a. mild-mod by echo 05/2008;  b. mild by echo 07/2012  . OSTEOPOROSIS   . DEPRESSION   . Chronic lung disease     a. on home O2;  b. 07/2012 CTA chest: chronic sev lung dzs, similar to 2009 CT, stable R paratracheal nodes.  . OBSTRUCTIVE SLEEP APNEA     pt denies this hx (08/17/2012) - though previously seen by Dr. Shelle Ironlance for this Dx.  . Daily headache   . Arthritis     "knees" (08/17/2012)  . Anxiety   . Depression     Current Outpatient Prescriptions  Medication Sig Dispense Refill  . ALPRAZolam (XANAX) 0.25 MG tablet Take 1 tablet (0.25 mg total) by  mouth every 8 (eight) hours as needed for anxiety.  30 tablet  0  . Ascorbic Acid (VITAMIN C) 1000 MG tablet Take 1,000 mg by mouth daily.       Marland Kitchen. aspirin 81 MG tablet Take 325 mg by mouth daily.      . Cholecalciferol (VITAMIN D-3) 1000 UNITS CAPS Take 1 capsule by mouth daily.      . famotidine (PEPCID) 20 MG tablet take 1 tablet by mouth at bedtime  90 tablet  0  . furosemide (LASIX) 40 MG tablet TAKE 1 TABLET (40 MG TOTAL) BY MOUTH DAILY.  30 tablet  2  . metoprolol (LOPRESSOR) 50 MG tablet Take 1 tablet (50 mg total) by mouth 2 (two) times daily.  180 tablet  0  . nitroGLYCERIN (NITROSTAT) 0.4 MG SL tablet Place 1 tablet (0.4 mg total) under the tongue every 5 (five) minutes as needed. For chest pain  25 tablet  3  . pantoprazole (PROTONIX) 40 MG tablet take 1 tablet by mouth daily before BREAKFAST  90 tablet  0  . potassium chloride SA (K-DUR,KLOR-CON) 20 MEQ tablet TAKE 1 TABLET (20 MEQ TOTAL)  BY MOUTH DAILY.  90 tablet  0  . valsartan (DIOVAN) 320 MG tablet Take 160 mg by mouth daily.       No current facility-administered medications for this visit.    Allergies:   Review of patient's allergies indicates no known allergies.   Social History:  The patient  reports that she has never smoked. She has never used smokeless tobacco. She reports that she drinks alcohol. She reports that she does not use illicit drugs.   Family History:  The patient's family history includes Cancer in her brother and father; Heart disease in her sister; Other in her mother; Stroke in her sister.   ROS:  Please see the history of present illness.   She notes frequent urination with some dysuria.  She denies bleeding problems.   All other systems reviewed and negative.   PHYSICAL EXAM: VS:  BP 90/42  Pulse 75  Ht 5' (1.524 m)  Wt 143 lb (64.864 kg)  BMI 27.93 kg/m2 Well nourished, well developed, in no acute distress HEENT: normal Neck: no JVDat 90 degrees Cardiac:  normal S1, S2; RRR; 1-2/6 systolic  murmurLSB; +S3 Lungs:  Decrease breath sounds, crackles in bilateral bases 1/2 up chest Abd: soft, nontender, no hepatomegaly Ext: trace bilateral ankle edema Skin: warm and dry Neuro:  CNs 2-12 intact, no focal abnormalities noted  EKG:  NSR, HR 75, inc RBBB, TWI in 2, 3, aVF, V3-6  ASSESSMENT AND PLAN:  1. Dyspnea:  She has acute worsening of dyspnea over the last several weeks.  She has ran out of her Lasix 1 month ago.  She does not look significantly volume overloaded on exam.  She likely has some extra volume on board and would probably respond to one dose of IV Lasix 40 mg.  However, she probably has an underlying pulmonary component (worsening) that is contributing to her current status.  She was also seen by Dr. Delane Ginger (DOD) today.  We have recommended she go to the ED for admission.  She will need to be admitted by the internal medicine service for a comprehensive work up.  Our service can follow along as a consultant to manage her cardiac issues.   2. Chest pain:  She has some worsening TW changes on her ECG.  However, overall picture does not seem to be consistent with ACS.  She will be sent to the ED as noted.  She will need serial CEs and full dose IV heparin if CEs are abnormal.  She would probably also benefit from a follow up echo.  3. DIASTOLIC HEART FAILURE, CHRONIC:  She will be sent to the ED to be admitted by internal medicine.  Would suggest one dose of Lasix IV 40 mg, then resume her usual home dose.   4. HYPERTENSION:  BP is running low.  Would recommend holding all antihypertensives until BP recovers. 5. AORTIC INSUFFICIENCY: mild by last echo.  Recommend repeat echo at the hospital.  6. Chronic respiratory failure:  She has a hx of pulmonary fibrosis and is followed by Dr. Sherene Sires.  Suspect her underlying lung disease is contributing to her current clinical picture.  She would probably benefit from being followed by pulmonology as well.  7. Dysuria: Consider checking a  urinalysis.  This will be per the attending service.  8. Disposition:  She will be sent to the ED by ambulance today as noted above.    Signed, Brynda Rim, MHS 04/28/2014 12:48 PM    [facility] [address] [place], [place], [place]  [address] Phone: [phone]; Fax: [phone]   Attending Note:   The patient was seen and examined.  Agree with assessment and plan as noted above.  Changes made to the above note as needed.  Lori [name] is seen today.  She has severe chronic lung disease-likely due to pulmonary fibrosis. She sees the pulmonary doctors on a regular basis. She's a previous patient of Dr. wall and has not been seen in our office since he left last summer.  She has missed about a month of her cardiac medications. Despite this she does not appear to be all that volume overloaded. I do think that she needs to be in the hospital. She's very short of breath walking as little as 20 feet.  We'll check troponins to make sure that this is non-acute coronary syndrome. We'll need repeat her echocardiogram. We'll follow her in consultation.    Vesta Mixer, Montez Hageman., MD, [facility] 04/28/2014, 4:52 PM

## 2014-04-28 NOTE — ED Notes (Signed)
Pt reports shortness of breath dramatically increased 2 months ago from her norm.  Pt states her family, PCP, and cardiologist convinced her to come today.

## 2014-04-28 NOTE — ED Notes (Signed)
Pt given sprite with cup of ice.  RN aware of same.

## 2014-04-28 NOTE — ED Notes (Signed)
Family at bedside.  Vitals taken.  Pt requesting something else to drink.

## 2014-04-28 NOTE — ED Notes (Signed)
Dr Fonnie Jarvis given a copy of troponin results .16

## 2014-04-28 NOTE — ED Notes (Signed)
MD at bedside. 

## 2014-04-28 NOTE — ED Notes (Signed)
Bednar, MD notified of abnormal lab test results 

## 2014-04-28 NOTE — Progress Notes (Signed)
Report received from ED 

## 2014-04-28 NOTE — Consult Note (Signed)
Cardiology Office Note  Date: 04/28/2014  ID: Lori [name], DOB Aug 19, 1935, MRN [id number]  PCP: Jackie PlumSEI-BONSU,GEORGE, MD  Cardiologist: Dr. Valera Castlehomas Wall => Dr. Tobias AlexanderKatarina Nelson  History of Present Illness:  Lori Longilene Hathaway is a 79 y.o. female with a hx of diastolic CHF, HTN, AI, chronic respiratory failure and pulmonary fibrosis (followed by Dr. Sherene SiresWert). Last seen by Dr. Valera Castlehomas Wall 06/2013.  She presents today with worsening dyspnea over the last several weeks. She ran out of Lasix a month ago. She feels like her legs are swollen. She reports 3 pillow orthopnea. Denies PND. She is having constant L sided chest pain that is pressure like. She has some radiation to her L arm. CP has been present for 2 weeks.  Studies:  - LHC (06/2000): No CAD. EF 70%.  - Echo (07/2012): EF 60% to 65%. Wall motion was normal. Grade 1 diastolic Dysfunction. Mild AI. MAC.  Recent Labs:  07/04/2013: Hemoglobin 14.4; Pro B Natriuretic peptide (BNP) 119.0*   Wt Readings from Last 3 Encounters:   04/28/14  143 lb (64.864 kg)   09/21/13  148 lb (67.132 kg)   08/09/13  150 lb 6.4 oz (68.221 kg)    Past Medical History   Diagnosis  Date   .  RBBB    .  HYPERTENSION    .  DIASTOLIC HEART FAILURE, CHRONIC      a. 05/2008 Echo: EF 55-60%, Diast Dysfxn, mild-mod AI, triv MR; b. 07/2012 Echo: EF 60-65%, Gr 1 DD, Mid AI.   Marland Kitchen.  AORTIC INSUFFICIENCY      a. mild-mod by echo 05/2008; b. mild by echo 07/2012   .  OSTEOPOROSIS    .  DEPRESSION    .  Chronic lung disease      a. on home O2; b. 07/2012 CTA chest: chronic sev lung dzs, similar to 2009 CT, stable R paratracheal nodes.   .  OBSTRUCTIVE SLEEP APNEA      pt denies this hx (08/17/2012) - though previously seen by Dr. Shelle Ironlance for this Dx.   .  Daily headache    .  Arthritis      "knees" (08/17/2012)   .  Anxiety    .  Depression     Current Outpatient Prescriptions   Medication  Sig  Dispense  Refill   .  ALPRAZolam (XANAX) 0.25 MG tablet  Take 1 tablet (0.25 mg total) by  mouth every 8 (eight) hours as needed for anxiety.  30 tablet  0   .  Ascorbic Acid (VITAMIN C) 1000 MG tablet  Take 1,000 mg by mouth daily.     Marland Kitchen.  aspirin 81 MG tablet  Take 325 mg by mouth daily.     .  Cholecalciferol (VITAMIN D-3) 1000 UNITS CAPS  Take 1 capsule by mouth daily.     .  famotidine (PEPCID) 20 MG tablet  take 1 tablet by mouth at bedtime  90 tablet  0   .  furosemide (LASIX) 40 MG tablet  TAKE 1 TABLET (40 MG TOTAL) BY MOUTH DAILY.  30 tablet  2   .  metoprolol (LOPRESSOR) 50 MG tablet  Take 1 tablet (50 mg total) by mouth 2 (two) times daily.  180 tablet  0   .  nitroGLYCERIN (NITROSTAT) 0.4 MG SL tablet  Place 1 tablet (0.4 mg total) under the tongue every 5 (five) minutes as needed. For chest pain  25 tablet  3   .  pantoprazole (PROTONIX) 40 MG  tablet  take 1 tablet by mouth daily before BREAKFAST  90 tablet  0   .  potassium chloride SA (K-DUR,KLOR-CON) 20 MEQ tablet  TAKE 1 TABLET (20 MEQ TOTAL) BY MOUTH DAILY.  90 tablet  0   .  valsartan (DIOVAN) 320 MG tablet  Take 160 mg by mouth daily.      No current facility-administered medications for this visit.    Allergies: Review of patient's allergies indicates no known allergies.  Social History: The patient reports that she has never smoked. She has never used smokeless tobacco. She reports that she drinks alcohol. She reports that she does not use illicit drugs.  Family History: The patient's family history includes Cancer in her brother and father; Heart disease in her sister; Other in her mother; Stroke in her sister.  ROS: Please see the history of present illness. She notes frequent urination with some dysuria. She denies bleeding problems. All other systems reviewed and negative.  PHYSICAL EXAM:  VS: BP 90/42  Pulse 75  Ht 5' (1.524 m)  Wt 143 lb (64.864 kg)  BMI 27.93 kg/m2  Well nourished, well developed, in no acute distress  HEENT: normal  Neck: no JVD at 90 degrees  Cardiac: normal S1, S2; RRR; 1-2/6  systolic murmur LSB; +S3  Lungs: Decrease breath sounds, crackles in bilateral bases 1/2 up chest  Abd: soft, nontender, no hepatomegaly  Ext: trace bilateral ankle edema  Skin: warm and dry  Neuro: CNs 2-12 intact, no focal abnormalities noted  EKG: NSR, HR 75, inc RBBB, TWI in 2, 3, aVF, V3-6  ASSESSMENT AND PLAN:  1. Dyspnea: She has acute worsening of dyspnea over the last several weeks. She has ran out of her Lasix 1 month ago. She does not look significantly volume overloaded on exam. She likely has some extra volume on board and would probably respond to one dose of IV Lasix 40 mg. However, she probably has an underlying pulmonary component (worsening) that is contributing to her current status. She was also seen by Dr. Delane Ginger (DOD) today. We have recommended she go to the ED for admission. She will need to be admitted by the internal medicine service for a comprehensive work up. Our service can follow along as a consultant to manage her cardiac issues.  2. Chest pain: She has some worsening TW changes on her ECG. However, overall picture does not seem to be consistent with ACS. She will be sent to the ED as noted. She will need serial CEs and full dose IV heparin if CEs are abnormal. She would probably also benefit from a follow up echo.  3. DIASTOLIC HEART FAILURE, CHRONIC: She will be sent to the ED to be admitted by internal medicine. Would suggest one dose of Lasix IV 40 mg, then resume her usual home dose.  4. HYPERTENSION: BP is running low. Would recommend holding all antihypertensives until BP recovers. 5. AORTIC INSUFFICIENCY: mild by last echo. Recommend repeat echo at the hospital.  6. Chronic respiratory failure: She has a hx of pulmonary fibrosis and is followed by Dr. Sherene Sires. Suspect her underlying lung disease is contributing to her current clinical picture. She would probably benefit from being followed by pulmonology as well.  7. Dysuria: Consider checking a urinalysis. This  will be per the attending service.  8. Disposition: She will be sent to the ED by ambulance today as noted above.  9.  Signed,  Brynda Rim, MHS  04/28/2014 12:48 PM  [facility]  [address] [place], [place], [place] [address]  Phone: [phone]; Fax: [phone]    Attending Note:  The patient was seen and examined. Agree with assessment and plan as noted above. Changes made to the above note as needed.  Ms. Vallas is seen today. She has severe chronic lung disease-likely due to pulmonary fibrosis. She sees the pulmonary doctors on a regular basis. She's a previous patient of Dr. wall and has not been seen in our office since he left last summer.  She has missed about a month of her cardiac medications. Despite this she does not appear to be all that volume overloaded. I do think that she needs to be in the hospital. She's very short of breath walking as little as 20 feet.  We'll check troponins to make sure that this is non-acute coronary syndrome. We'll need repeat her echocardiogram.  We'll follow her in consultation.    Vesta Mixer, Montez Hageman., MD, [facility]  04/28/2014, 4:52 PM

## 2014-04-28 NOTE — ED Notes (Signed)
Pt from cardiology office via [facility] with c/o increasing shortness of breath starting today.  Pt is normally on 4L O2 was 94% SpO2 when EMS arrived.  EMS reports diminished and crackle lung sounds.  Pt also reports chest pressure radiating to left shoulder and arm x 1 month.  Pt in NAD, A&O.

## 2014-04-28 NOTE — ED Provider Notes (Signed)
CSN: [id number]     Arrival date & time 04/28/14  1422 History   First MD Initiated Contact with Patient 04/28/14 1601     Chief Complaint  Patient presents with  . Shortness of Breath     (Consider location/radiation/quality/duration/timing/severity/associated sxs/prior Treatment) HPI 79 year old female with history of heart failure, pulmonary fibrosis, chronic respiratory failure, chronic hypoxia, home oxygen at 4.5 L per minute at baseline, echocardiogram in 2013 showed normal ejection fraction 60-65%, now presents with 2 months gradually worsening exertional and resting shortness of breath with a 5 pound weight loss, mild edema, mild orthopnea, ran out of her Lasix within the last couple weeks, sent to the emergency department for hospitalization by internal medicine since she was seen today or cardiology clinic who recommended 40 mg IV Lasix as well as starting IV heparin if the patient had elevated troponin which she does, patient has no fever no confusion no chest pain no abdominal pain no vomiting no diarrhea no bloody stools. There is no treatment prior to arrival. She is minimal if any cough for the last 2 months. Past Medical History  Diagnosis Date  . RBBB   . HYPERTENSION   . DIASTOLIC HEART FAILURE, CHRONIC     a. 05/2008 Echo: EF 55-60%, Diast Dysfxn, mild-mod AI, triv MR;  b. 07/2012 Echo: EF 60-65%, Gr 1 DD, Mid AI.  Marland Kitchen AORTIC INSUFFICIENCY     a. mild-mod by echo 05/2008;  b. mild by echo 07/2012  . OSTEOPOROSIS   . DEPRESSION   . Chronic lung disease     a. on home O2;  b. 07/2012 CTA chest: chronic sev lung dzs, similar to 2009 CT, stable R paratracheal nodes.  . OBSTRUCTIVE SLEEP APNEA     pt denies this hx (08/17/2012) - though previously seen by Dr. Shelle Iron for this Dx.  . Daily headache   . Arthritis     "knees" (08/17/2012)  . Anxiety   . Depression   . Aortic valve disorders   . Respiratory failure, chronic   . GERD (gastroesophageal reflux disease)    Past Surgical  History  Procedure Laterality Date  . Rhinoplasty  1953    "broke it"   Family History  Problem Relation Age of Onset  . Heart disease Sister   . Cancer Father     died in his 30's  . Other Mother     died @ 41 - "old age"  . Stroke Sister     deceased  . Cancer Brother     deceased   History  Substance Use Topics  . Smoking status: Never Smoker   . Smokeless tobacco: Never Used  . Alcohol Use: 4.2 oz/week    7 Glasses of wine per week   OB History   Grav Para Term Preterm Abortions TAB SAB Ect Mult Living                 Review of Systems 10 Systems reviewed and are negative for acute change except as noted in the HPI.   Allergies  Review of patient's allergies indicates no known allergies.  Home Medications   Prior to Admission medications   Medication Sig Start Date End Date Taking? Authorizing Provider  ALPRAZolam (XANAX) 0.25 MG tablet Take 1 tablet (0.25 mg total) by mouth every 8 (eight) hours as needed for anxiety. 10/31/13  Yes Nyoka Cowden, MD  ascorbic acid (VITAMIN C) 500 MG tablet Take 500 mg by mouth daily.   Yes Historical  Provider, MD  aspirin 81 MG tablet Take 325 mg by mouth daily. 08/19/12  Yes Ok Anis, NP  Cholecalciferol (VITAMIN D-3) 1000 UNITS CAPS Take 1 capsule by mouth daily.   Yes Historical Provider, MD  famotidine (PEPCID) 20 MG tablet Take 20 mg by mouth daily.   Yes Historical Provider, MD  furosemide (LASIX) 40 MG tablet TAKE 1 TABLET (40 MG TOTAL) BY MOUTH DAILY. 10/31/13  Yes Nyoka Cowden, MD  metoprolol (LOPRESSOR) 50 MG tablet Take 1 tablet (50 mg total) by mouth 2 (two) times daily. 12/26/13  Yes Lars Masson, MD  nitroGLYCERIN (NITROSTAT) 0.4 MG SL tablet Place 1 tablet (0.4 mg total) under the tongue every 5 (five) minutes as needed. For chest pain 06/20/13  Yes Nyoka Cowden, MD  pantoprazole (PROTONIX) 40 MG tablet take 1 tablet by mouth daily before BREAKFAST 12/06/13  Yes Nyoka Cowden, MD  potassium chloride  SA (K-DUR,KLOR-CON) 20 MEQ tablet TAKE 1 TABLET (20 MEQ TOTAL) BY MOUTH DAILY. 12/26/13  Yes Lars Masson, MD  valsartan (DIOVAN) 160 MG tablet Take 160 mg by mouth daily.   Yes Historical Provider, MD   BP 129/60  Pulse 87  Temp(Src) 97.3 F (36.3 C) (Oral)  Resp 22  Ht 5' (1.524 m)  Wt 139 lb 5.3 oz (63.2 kg)  BMI 27.21 kg/m2  SpO2 98% Physical Exam  Nursing note and vitals reviewed. Constitutional:  Awake, alert, nontoxic appearance.  HENT:  Head: Atraumatic.  Eyes: Right eye exhibits no discharge. Left eye exhibits no discharge.  Neck: Neck supple.  Cardiovascular: Normal rate and regular rhythm.   No murmur heard. Pulmonary/Chest: Effort normal. No respiratory distress. She has no wheezes. She has rales. She exhibits no tenderness.  Diffuse scattered crackles throughout lung fields worse at bilateral bases no retractions no accessory muscle usage pulse oximetry on 6 L nasal cannula oxygen is adequate at 95% speaks full sentences without distress at rest upon arrival  Abdominal: Soft. Bowel sounds are normal. She exhibits no distension. There is no tenderness. There is no rebound and no guarding.  Musculoskeletal: She exhibits edema. She exhibits no tenderness.  Baseline ROM, no obvious new focal weakness. Trace edema bilateral lower legs  Neurological: She is alert.  Mental status and motor strength appears baseline for patient and situation.  Skin: No rash noted.  Psychiatric: She has a normal mood and affect.    ED Course  Procedures (including critical care time) D-dimer not ordered since the patient already getting CT chest ordered to rule out mass based on abnormal chest x-ray so CTA ordered to r/o PE as well.  CRITICAL CARE Performed by: Hurman Horn Total critical care time: cannot r/o NSTEMI vs. elevated troponin from HF from initial level Critical care time was exclusive of separately billable procedures and treating other patients. Critical care was  necessary to treat or prevent imminent or life-threatening deterioration. Critical care was time spent personally by me on the following activities: development of treatment plan with patient and/or surrogate as well as nursing, discussions with consultants, evaluation of patient's response to treatment, examination of patient, obtaining history from patient or surrogate, ordering and performing treatments and interventions, ordering and review of laboratory studies, ordering and review of radiographic studies, pulse oximetry and re-evaluation of patient's condition.  Labs Review Labs Reviewed  BASIC METABOLIC PANEL - Abnormal; Notable for the following:    GFR calc non Af Amer 66 (*)    GFR calc  Af Amer 77 (*)    All other components within normal limits  CBC - Abnormal; Notable for the following:    WBC 12.0 (*)    All other components within normal limits  PRO B NATRIURETIC PEPTIDE - Abnormal; Notable for the following:    Pro B Natriuretic peptide (BNP) 3118.0 (*)    All other components within normal limits  HEPARIN LEVEL (UNFRACTIONATED) - Abnormal; Notable for the following:    Heparin Unfractionated 0.15 (*)    All other components within normal limits  CBC - Abnormal; Notable for the following:    WBC 12.0 (*)    All other components within normal limits  TROPONIN I - Abnormal; Notable for the following:    Troponin I 0.36 (*)    All other components within normal limits  BASIC METABOLIC PANEL - Abnormal; Notable for the following:    GFR calc non Af Amer 62 (*)    GFR calc Af Amer 72 (*)    All other components within normal limits  HEPARIN LEVEL (UNFRACTIONATED) - Abnormal; Notable for the following:    Heparin Unfractionated <0.10 (*)    All other components within normal limits  HEPARIN LEVEL (UNFRACTIONATED) - Abnormal; Notable for the following:    Heparin Unfractionated 0.77 (*)    All other components within normal limits  CBC - Abnormal; Notable for the following:     WBC 12.2 (*)    All other components within normal limits  CBC - Abnormal; Notable for the following:    WBC 11.6 (*)    All other components within normal limits  BASIC METABOLIC PANEL - Abnormal; Notable for the following:    CO2 33 (*)    GFR calc non Af Amer 50 (*)    GFR calc Af Amer 58 (*)    All other components within normal limits  I-STAT TROPOININ, ED - Abnormal; Notable for the following:    Troponin i, poc 0.19 (*)    All other components within normal limits  I-STAT TROPOININ, ED - Abnormal; Notable for the following:    Troponin i, poc 0.16 (*)    All other components within normal limits  CULTURE, BLOOD (ROUTINE X 2)  CULTURE, BLOOD (ROUTINE X 2)  MRSA PCR SCREENING  CULTURE, EXPECTORATED SPUTUM-ASSESSMENT  GRAM STAIN  TROPONIN I  STREP PNEUMONIAE URINARY ANTIGEN  TROPONIN I  TROPONIN I  HEPARIN LEVEL (UNFRACTIONATED)  LEGIONELLA ANTIGEN, URINE  CBC    Imaging Review Dg Chest 2 View  05/01/2014   CLINICAL DATA:  Shortness of breath.  EXAM: CHEST  2 VIEW  COMPARISON:  04/28/2014  FINDINGS: The heart is enlarged. There are patchy parenchymal infiltrates bilaterally consistent with pulmonary fibrosis and unchanged.  IMPRESSION: No significant change in the appearance of pulmonary opacities.   Electronically Signed   By: Rosalie GumsBeth  Brown M.D.   On: 05/01/2014 13:53   Dg Chest 2 View  05/01/2014   CLINICAL DATA:  Shortness of breath.  EXAM: CHEST  2 VIEW  COMPARISON:  04/28/2014  FINDINGS: The heart is enlarged. There are patchy parenchymal infiltrates bilaterally consistent with pulmonary fibrosis and unchanged.  IMPRESSION: No significant change in the appearance of pulmonary opacities.   Electronically Signed   By: Rosalie GumsBeth  Brown M.D.   On: 05/01/2014 13:53   Ct Angio Chest Pe W/cm &/or Wo Cm  04/28/2014   CLINICAL DATA:  Chest pressure and shortness of Breath  EXAM: CT ANGIOGRAPHY CHEST WITH CONTRAST  TECHNIQUE: Multidetector  CT imaging of the chest was performed using the  standard protocol during bolus administration of intravenous contrast. Multiplanar CT image reconstructions and MIPs were obtained to evaluate the vascular anatomy.  CONTRAST:  OMNIPAQUE IOHEXOL 350 MG/ML SOLN  COMPARISON:  Chest radiograph April 28, 2014 and chest CT angiogram August 18, 2012  FINDINGS: There is no demonstrable pulmonary embolus. There is no thoracic aortic aneurysm or dissection. The right subclavian artery has anaberrant course, passing posterior to the esophagus.  There is underlying emphysematous change. There is extensive interstitial fibrosis bilaterally with multiple areas of patchy consolidation throughout both lungs, a finding that was also present previously. In comparison with the previous study from 2013, there is new opacity in the right upper lobe anterior segment.  There are remains generalized soft tissue thickening in the mediastinum to the left of the trachea extending from the upper thoracic trachea on the left to the level of the carinal, stable. There are multiple lymph nodes in this area, stable. No new lymph adenopathy is appreciable on this study.  Heart is mildly enlarged. There is slight pericardial thickening which is stable.  Visualized upper abdominal structures appear unremarkable. There are no blastic or lytic bone lesions. There is degenerative change in the thoracic spine. Thyroid appears unremarkable.  Review of the MIP images confirms the above findings.  IMPRESSION: No demonstrable pulmonary embolus.  Extensive lung fibrosis with underlying emphysema. In comparison with the prior study, there is new airspace consolidation in portions of the right upper lobe, primarily in the anterior segment. These areas have an appearance more suspicious for pneumonia than mass, although a mass could easily be obscured by the degree of consolidation and fibrosis in this area. In this regard, would advise followup study in 3-4 weeks to further assess. If this area persists  without change at that time, it would be entirely reasonable and advisable to consider PET-CT with particular attention to the right upper lobe region.  Thoracic adenopathy is stable. No new soft tissue thickening or lymph node prominence is seen in the mediastinum.  Heart is enlarged with mild pericardial thickening, stable.   Electronically Signed   By: Bretta Bang M.D.   On: 04/28/2014 17:34   Dg Chest Port 1 View  04/28/2014   CLINICAL DATA:  Shortness of breath.  EXAM: PORTABLE CHEST - 1 VIEW  COMPARISON:  Chest x-ray 09/21/2013.  FINDINGS: Again noted is a background of diffuse interstitial prominence. However, compared to most recent prior examinations, there is worsening confluent opacity throughout the left mid to lower lung. Persistent somewhat nodular opacity in the right upper lobe, which is increasingly conspicuous over the past several examinations. No definite pleural effusions. Findings are not favored to reflect edema. Heart size does appear mildly enlarged. Upper mediastinal contours are distorted by patient's rotation to the left.  IMPRESSION: 1. Although there is clearly a background of underlying interstitial lung disease, today's study demonstrates new airspace consolidation throughout the left mid to lower lung, obscuring left heart border, likely within both the lingula and left lower lobe, concerning for multilobar pneumonia. 2. Increasing conspicuity of nodular opacity in the periphery of the right upper lobe over prior examinations, concerning for potential neoplasm. 3. For further evaluation of all these findings, a followup high-resolution chest CT is suggested in the near future.   Electronically Signed   By: Trudie Reed M.D.   On: 04/28/2014 15:27   EKG Interpretation   Date/Time:  Friday April 28 2014 20:34:34 EDT  Ventricular Rate:  78 PR Interval:  139 QRS Duration: 122 QT Interval:  415 QTC Calculation: 473 R Axis:   85 Text Interpretation:  Sinus rhythm LAE,  consider biatrial enlargement  Right bundle branch block Probable posterior infarct, acute No significant  change since last tracing Confirmed by [facility]  MD, Jonny Ruiz ([id number]) on  04/28/2014 8:43:16 PM      MDM   Final diagnoses:  Heart failure  Elevated troponin  Pulmonary fibrosis  CAP (community acquired pneumonia)    The patient appears reasonably stabilized for admission considering the current resources, flow, and capabilities available in the ED at this time, and I doubt any other [facility] requiring further screening and/or treatment in the ED prior to admission.    Hurman Horn, MD 05/02/14 [id number]

## 2014-04-28 NOTE — Patient Instructions (Signed)
PT SENT TO ER AT [facility] PER DR. Elease Hashimoto (DOD) AND SCOTT WEAVER, PAC

## 2014-04-28 NOTE — ED Notes (Signed)
Patient transported to CT 

## 2014-04-28 NOTE — ED Notes (Signed)
Phlebotomy at BS

## 2014-04-28 NOTE — ED Notes (Signed)
Pt reports she is having new onset left sided chest pain that she describes as jabbing.

## 2014-04-28 NOTE — Progress Notes (Signed)
ANTICOAGULATION CONSULT NOTE - Initial Consult  Pharmacy Consult for Heparin  Indication: chest pain/ACS  No Known Allergies  Patient Measurements: Height: 5' (152.4 cm) Weight: 143 lb (64.864 kg) IBW/kg (Calculated) : 45.5 Heparin Dosing Weight: 65 kg   Vital Signs: Temp: 97.5 F (36.4 C) (06/05 1437) Temp src: Oral (06/05 1437) BP: 129/53 mmHg (06/05 1515) Pulse Rate: 67 (06/05 1515)  Labs:  Recent Labs  04/28/14 1523  HGB 13.0  HCT 39.9  PLT 289  CREATININE 0.83    Estimated Creatinine Clearance: 47.8 ml/min (by C-G formula based on Cr of 0.83).   Medical History: Past Medical History  Diagnosis Date  . RBBB   . HYPERTENSION   . DIASTOLIC HEART FAILURE, CHRONIC     a. 05/2008 Echo: EF 55-60%, Diast Dysfxn, mild-mod AI, triv MR;  b. 07/2012 Echo: EF 60-65%, Gr 1 DD, Mid AI.  Marland Kitchen AORTIC INSUFFICIENCY     a. mild-mod by echo 05/2008;  b. mild by echo 07/2012  . OSTEOPOROSIS   . DEPRESSION   . Chronic lung disease     a. on home O2;  b. 07/2012 CTA chest: chronic sev lung dzs, similar to 2009 CT, stable R paratracheal nodes.  . OBSTRUCTIVE SLEEP APNEA     pt denies this hx (08/17/2012) - though previously seen by Dr. Shelle Iron for this Dx.  . Daily headache   . Arthritis     "knees" (08/17/2012)  . Anxiety   . Depression   . Aortic valve disorders   . Respiratory failure, chronic   . GERD (gastroesophageal reflux disease)     Medications:   (Not in a hospital admission)  Assessment: 40 YOF with 2 months of gradual worsening SOB and 5 pound weight gain in the past few weeks. She was sent to the ED by cardiology clinic who recommended starting IV heparin for elevated troponins. Troponins in ED elevated at 0.19. H/H and plt wnl.   Goal of Therapy:  Heparin level 0.3-0.7 units/ml Monitor platelets by anticoagulation protocol: Yes   Plan:  Give 3000 units bolus x 1 Start heparin infusion at 750 units/hr Check anti-Xa level in 8 hours and daily while on  heparin Continue to monitor H&H and platelets  Vinnie Level, PharmD.  Clinical Pharmacist Pager [phone]

## 2014-04-28 NOTE — ED Notes (Signed)
Admitting MD at bedside.

## 2014-04-28 NOTE — ED Notes (Signed)
Admitting doctor at bedside 

## 2014-04-28 NOTE — ED Notes (Signed)
Pt placed on bedpan.  Family standing in hallway waiting on pt to finish.

## 2014-04-29 DIAGNOSIS — I369 Nonrheumatic tricuspid valve disorder, unspecified: Secondary | ICD-10-CM

## 2014-04-29 DIAGNOSIS — J8410 Pulmonary fibrosis, unspecified: Secondary | ICD-10-CM

## 2014-04-29 DIAGNOSIS — I4510 Unspecified right bundle-branch block: Secondary | ICD-10-CM

## 2014-04-29 DIAGNOSIS — D649 Anemia, unspecified: Secondary | ICD-10-CM

## 2014-04-29 DIAGNOSIS — J189 Pneumonia, unspecified organism: Secondary | ICD-10-CM

## 2014-04-29 DIAGNOSIS — I509 Heart failure, unspecified: Secondary | ICD-10-CM

## 2014-04-29 DIAGNOSIS — I5033 Acute on chronic diastolic (congestive) heart failure: Principal | ICD-10-CM

## 2014-04-29 DIAGNOSIS — I2510 Atherosclerotic heart disease of native coronary artery without angina pectoris: Secondary | ICD-10-CM

## 2014-04-29 DIAGNOSIS — J9610 Chronic respiratory failure, unspecified whether with hypoxia or hypercapnia: Secondary | ICD-10-CM

## 2014-04-29 LAB — CBC
HCT: 37.8 % (ref 36.0–46.0)
Hemoglobin: 12.3 g/dL (ref 12.0–15.0)
MCH: 29.5 pg (ref 26.0–34.0)
MCHC: 32.5 g/dL (ref 30.0–36.0)
MCV: 90.6 fL (ref 78.0–100.0)
Platelets: 264 10*3/uL (ref 150–400)
RBC: 4.17 MIL/uL (ref 3.87–5.11)
RDW: 14.2 % (ref 11.5–15.5)
WBC: 12 10*3/uL — ABNORMAL HIGH (ref 4.0–10.5)

## 2014-04-29 LAB — BASIC METABOLIC PANEL
BUN: 15 mg/dL (ref 6–23)
CO2: 29 mEq/L (ref 19–32)
CREATININE: 0.88 mg/dL (ref 0.50–1.10)
Calcium: 8.8 mg/dL (ref 8.4–10.5)
Chloride: 99 mEq/L (ref 96–112)
GFR, EST AFRICAN AMERICAN: 72 mL/min — AB (ref >90)
GFR, EST NON AFRICAN AMERICAN: 62 mL/min — AB (ref >90)
Glucose, Bld: 86 mg/dL (ref 70–99)
Potassium: 4 mEq/L (ref 3.7–5.3)
Sodium: 139 mEq/L (ref 137–147)

## 2014-04-29 LAB — HEPARIN LEVEL (UNFRACTIONATED)
HEPARIN UNFRACTIONATED: 0.54 [IU]/mL (ref 0.30–0.70)
Heparin Unfractionated: 0.15 [IU]/mL — ABNORMAL LOW (ref 0.30–0.70)
Heparin Unfractionated: <0.1 IU/mL — ABNORMAL LOW (ref 0.30–0.70)

## 2014-04-29 LAB — TROPONIN I
Troponin I: 0.36 ng/mL (ref <0.30)
Troponin I: <0.3 ng/mL (ref <0.30)

## 2014-04-29 LAB — MRSA PCR SCREENING: MRSA by PCR: NEGATIVE

## 2014-04-29 MED ORDER — HEPARIN BOLUS VIA INFUSION
1800.0000 [IU] | Freq: Once | INTRAVENOUS | Status: AC
  Administered 2014-04-29: 1800 [IU] via INTRAVENOUS
  Filled 2014-04-29: qty 1800

## 2014-04-29 NOTE — Progress Notes (Signed)
[facility] PROGRESS NOTE  Lori [name] ZOX:[id number] DOB: 01/24/1935 DOA: 04/28/2014 PCP: Jackie PlumSEI-BONSU,GEORGE, MD  Assessment/Plan: 1. Atypical chest pain with mildly elevated troponin -likely demand, chest pain free now -continue ASA, was started on heaprin overnight, continue this pending cards eval -EKG unchanged from prior -check ECHo -cards consulted, normal cath in 2001  2. Advanced Pulm fibrosis -on 4-5L home O2, foll by dr.Wert  3. Community acquired pneumonia -based on Ct chest, although didn't have many symptoms to suggest this -continue rocephin/zithromax -FU sputum Cx  4. Acute on chronic diastolic CHF -continue IV lasix, Bmet in am -FU I/Os, weights -FU ECHo  5. Anxiety -xanax PRN  DVT proph" on heparin  Code Status: Full Code Family Communication:d/w daughter at bedside Disposition Plan: home when stable   Consultants:  Cards  HPI/Subjective: Feels better, breathing improving, no further chest pain  Objective: Filed Vitals:   04/29/14 0755  BP:   Pulse:   Temp: 98 F (36.7 C)  Resp: 23    Intake/Output Summary (Last 24 hours) at 04/29/14 0900 Last data filed at 04/29/14 0800  Gross per 24 hour  Intake    503 ml  Output   2225 ml  Net  -1722 ml   Filed Weights   04/28/14 1434 04/28/14 2245  Weight: 64.864 kg (143 lb) 64.7 kg (142 lb 10.2 oz)    Exam:   General:  AAOx3  Cardiovascular:S1S2/RRR, diastolic murmur  Respiratory: fine crackles throughout  Abdomen: soft, Nt, BS [present  Musculoskeletal:trace edema no c/c   Data Reviewed: Basic Metabolic Panel:  Recent Labs Lab 04/28/14 1523 04/29/14 0340  NA 138 139  K 4.6 4.0  CL 99 99  CO2 28 29  GLUCOSE 88 86  BUN 18 15  CREATININE 0.83 0.88  CALCIUM 9.2 8.8   Liver Function Tests: No results found for this basename: AST, ALT, ALKPHOS, BILITOT, PROT, ALBUMIN,  in the last 168 hours No results found for this basename: LIPASE, AMYLASE,  in the last 168  hours No results found for this basename: AMMONIA,  in the last 168 hours CBC:  Recent Labs Lab 04/28/14 1523 04/29/14 0340  WBC 12.0* 12.0*  HGB 13.0 12.3  HCT 39.9 37.8  MCV 91.1 90.6  PLT 289 264   Cardiac Enzymes:  Recent Labs Lab 04/28/14 2215 04/29/14 0340  TROPONINI <0.30 0.36*   BNP (last 3 results)  Recent Labs  07/04/13 1247 04/28/14 1523  PROBNP 119.0* 3118.0*   CBG: No results found for this basename: GLUCAP,  in the last 168 hours  Recent Results (from the past 240 hour(s))  MRSA PCR SCREENING     Status: None   Collection Time    04/28/14 10:36 PM      Result Value Ref Range Status   MRSA by PCR NEGATIVE  NEGATIVE Final   Comment:            The GeneXpert MRSA Assay (FDA     approved for NASAL specimens     only), is one component of a     comprehensive MRSA colonization     surveillance program. It is not     intended to diagnose MRSA     infection nor to guide or     monitor treatment for     MRSA infections.     Studies: Ct Angio Chest Pe W/cm &/or Wo Cm  04/28/2014   CLINICAL DATA:  Chest pressure and shortness of Breath  EXAM: CT ANGIOGRAPHY CHEST WITH  CONTRAST  TECHNIQUE: Multidetector CT imaging of the chest was performed using the standard protocol during bolus administration of intravenous contrast. Multiplanar CT image reconstructions and MIPs were obtained to evaluate the vascular anatomy.  CONTRAST:  OMNIPAQUE IOHEXOL 350 MG/ML SOLN  COMPARISON:  Chest radiograph April 28, 2014 and chest CT angiogram August 18, 2012  FINDINGS: There is no demonstrable pulmonary embolus. There is no thoracic aortic aneurysm or dissection. The right subclavian artery has anaberrant course, passing posterior to the esophagus.  There is underlying emphysematous change. There is extensive interstitial fibrosis bilaterally with multiple areas of patchy consolidation throughout both lungs, a finding that was also present previously. In comparison with the  previous study from 2013, there is new opacity in the right upper lobe anterior segment.  There are remains generalized soft tissue thickening in the mediastinum to the left of the trachea extending from the upper thoracic trachea on the left to the level of the carinal, stable. There are multiple lymph nodes in this area, stable. No new lymph adenopathy is appreciable on this study.  Heart is mildly enlarged. There is slight pericardial thickening which is stable.  Visualized upper abdominal structures appear unremarkable. There are no blastic or lytic bone lesions. There is degenerative change in the thoracic spine. Thyroid appears unremarkable.  Review of the MIP images confirms the above findings.  IMPRESSION: No demonstrable pulmonary embolus.  Extensive lung fibrosis with underlying emphysema. In comparison with the prior study, there is new airspace consolidation in portions of the right upper lobe, primarily in the anterior segment. These areas have an appearance more suspicious for pneumonia than mass, although a mass could easily be obscured by the degree of consolidation and fibrosis in this area. In this regard, would advise followup study in 3-4 weeks to further assess. If this area persists without change at that time, it would be entirely reasonable and advisable to consider PET-CT with particular attention to the right upper lobe region.  Thoracic adenopathy is stable. No new soft tissue thickening or lymph node prominence is seen in the mediastinum.  Heart is enlarged with mild pericardial thickening, stable.   Electronically Signed   By: Bretta Bang M.D.   On: 04/28/2014 17:34   Dg Chest Port 1 View  04/28/2014   CLINICAL DATA:  Shortness of breath.  EXAM: PORTABLE CHEST - 1 VIEW  COMPARISON:  Chest x-ray 09/21/2013.  FINDINGS: Again noted is a background of diffuse interstitial prominence. However, compared to most recent prior examinations, there is worsening confluent opacity throughout  the left mid to lower lung. Persistent somewhat nodular opacity in the right upper lobe, which is increasingly conspicuous over the past several examinations. No definite pleural effusions. Findings are not favored to reflect edema. Heart size does appear mildly enlarged. Upper mediastinal contours are distorted by patient's rotation to the left.  IMPRESSION: 1. Although there is clearly a background of underlying interstitial lung disease, today's study demonstrates new airspace consolidation throughout the left mid to lower lung, obscuring left heart border, likely within both the lingula and left lower lobe, concerning for multilobar pneumonia. 2. Increasing conspicuity of nodular opacity in the periphery of the right upper lobe over prior examinations, concerning for potential neoplasm. 3. For further evaluation of all these findings, a followup high-resolution chest CT is suggested in the near future.   Electronically Signed   By: Trudie Reed M.D.   On: 04/28/2014 15:27    Scheduled Meds: . aspirin  325 mg  Oral Daily  . azithromycin  500 mg Intravenous Q24H  . cefTRIAXone (ROCEPHIN)  IV  1 g Intravenous Q24H  . famotidine  20 mg Oral Daily  . furosemide  40 mg Intravenous BID  . guaiFENesin  600 mg Oral BID  . irbesartan  75 mg Oral Daily  . metoprolol  50 mg Oral BID  . pantoprazole  20 mg Oral Daily  . sodium chloride  3 mL Intravenous Q12H   Continuous Infusions: . heparin 950 Units/hr (04/29/14 0400)   Antibiotics Given (last 72 hours)   None      Active Problems:   Heart failure   Unstable angina    Time spent:    Zannie Cove  [facility] Pager [phone]. If 7PM-7AM, please contact night-coverage at [email or web address], password [facility] 04/29/2014, 9:00 AM  LOS: 1 day

## 2014-04-29 NOTE — Progress Notes (Signed)
ANTICOAGULATION CONSULT NOTE - Follow Up Consult  Pharmacy Consult for heparin gtt Indication: chest pain/ACS  No Known Allergies  Patient Measurements: Height: 5' (152.4 cm) Weight: 142 lb 10.2 oz (64.7 kg) IBW/kg (Calculated) : 45.5 Heparin Dosing Weight: 59 kg  Vital Signs: Temp: 98.1 F (36.7 C) (06/06 1900) Temp src: Oral (06/06 1900) BP: 104/39 mmHg (06/06 2000) Pulse Rate: 84 (06/06 2100)  Labs:  Recent Labs  04/28/14 1523 04/28/14 2215 04/29/14 0135 04/29/14 0340 04/29/14 1140 04/29/14 1601 04/29/14 2138  HGB 13.0  --   --  12.3  --   --   --   HCT 39.9  --   --  37.8  --   --   --   PLT 289  --   --  264  --   --   --   HEPARINUNFRC  --   --  0.15*  --  <0.10*  --  0.54  CREATININE 0.83  --   --  0.88  --   --   --   TROPONINI  --  <0.30  --  0.36*  --  <0.30  --     Estimated Creatinine Clearance: 45 ml/min (by C-G formula based on Cr of 0.88).   Medications:  Scheduled:  . aspirin  325 mg Oral Daily  . azithromycin  500 mg Intravenous Q24H  . cefTRIAXone (ROCEPHIN)  IV  1 g Intravenous Q24H  . famotidine  20 mg Oral Daily  . furosemide  40 mg Intravenous BID  . guaiFENesin  600 mg Oral BID  . irbesartan  75 mg Oral Daily  . metoprolol  50 mg Oral BID  . pantoprazole  20 mg Oral Daily  . sodium chloride  3 mL Intravenous Q12H   Infusions:  . heparin 1,150 Units/hr (04/29/14 2100)    Assessment: 79 yo F originally presented with 2 months of gradually worsening SOB and 5 lbs weight gain in the last few weeks. She was sent to the ED by cardiology clinic for elevated troponins. Pharmacy consulted to continue heparin IV gtt. After rebolus and rate increase, now therapeutic at 0.54units/mL.  Goal of Therapy:  Heparin level 0.3-0.7 units/ml Monitor platelets by anticoagulation protocol: Yes   Plan:  1. Continue heparin gtt rate at 1150 units/hr 2. Next level with AM labs 3. daily HL and CBC 4. monitor for s/s of bleeding 5. f/u plans for  possible cath on Monday  Trexton Escamilla D. Alfredo Spong, PharmD, BCPS Clinical Pharmacist Pager: [phone] 04/29/2014 10:18 PM

## 2014-04-29 NOTE — Progress Notes (Signed)
Patient Name: Lori [name] Date of Encounter: 04/29/2014  Active Problems:   Heart failure   Unstable angina   Length of Stay: 1  SUBJECTIVE  Feeling much better. Best sleep in a long time >1.7 L net diuresis Stable renal function Echo pending cTrop I 0.36, nominally elevated Incidental note of coronary calcifications on CTA of chest  CURRENT MEDS . aspirin  325 mg Oral Daily  . azithromycin  500 mg Intravenous Q24H  . cefTRIAXone (ROCEPHIN)  IV  1 g Intravenous Q24H  . famotidine  20 mg Oral Daily  . furosemide  40 mg Intravenous BID  . guaiFENesin  600 mg Oral BID  . irbesartan  75 mg Oral Daily  . metoprolol  50 mg Oral BID  . pantoprazole  20 mg Oral Daily  . sodium chloride  3 mL Intravenous Q12H    OBJECTIVE   Intake/Output Summary (Last 24 hours) at 04/29/14 0934 Last data filed at 04/29/14 0800  Gross per 24 hour  Intake    503 ml  Output   2225 ml  Net  -1722 ml   Filed Weights   04/28/14 1434 04/28/14 2245  Weight: 143 lb (64.864 kg) 142 lb 10.2 oz (64.7 kg)    PHYSICAL EXAM Filed Vitals:   04/29/14 0341 04/29/14 0400 04/29/14 0600 04/29/14 0755  BP:  105/43 99/38   Pulse:  75 71   Temp: 97.8 F (36.6 C)   98 F (36.7 C)  TempSrc: Oral   Oral  Resp:  27 23 23   Height:      Weight:      SpO2:  93% 97% 92%   General: Alert, oriented x3, no distress Head: marked deviation of nasal septum, left nare occluded, PERRL, EOMI, no exophtalmos or lid lag, no myxedema, no xanthelasma; normal ears, nose and oropharynx Neck: normal jugular venous pulsations and no hepatojugular reflux; brisk carotid pulses without delay and no carotid bruits Chest: a few dry crackles and wheezes especially over right lung, no signs of consolidation by percussion or palpation, normal fremitus, symmetrical and full respiratory excursions Cardiovascular: normal position and quality of the apical impulse, regular rhythm, normal first and second heart sounds, no rubs or  gallops, no murmur Abdomen: no tenderness or distention, no masses by palpation, no abnormal pulsatility or arterial bruits, normal bowel sounds, no hepatosplenomegaly Extremities: no clubbing, cyanosis or edema; 2+ radial, ulnar and brachial pulses bilaterally; 2+ right femoral, posterior tibial and dorsalis pedis pulses; 2+ left femoral, posterior tibial and dorsalis pedis pulses; no subclavian or femoral bruits Neurological: grossly nonfocal  LABS  CBC  Recent Labs  04/28/14 1523 04/29/14 0340  WBC 12.0* 12.0*  HGB 13.0 12.3  HCT 39.9 37.8  MCV 91.1 90.6  PLT 289 264   Basic Metabolic Panel  Recent Labs  04/28/14 1523 04/29/14 0340  NA 138 139  K 4.6 4.0  CL 99 99  CO2 28 29  GLUCOSE 88 86  BUN 18 15  CREATININE 0.83 0.88  CALCIUM 9.2 8.8   Liver Function Tests No results found for this basename: AST, ALT, ALKPHOS, BILITOT, PROT, ALBUMIN,  in the last 72 hours No results found for this basename: LIPASE, AMYLASE,  in the last 72 hours Cardiac Enzymes  Recent Labs  04/28/14 2215 04/29/14 0340  TROPONINI <0.30 0.36*    Radiology Studies Imaging results have been reviewed and Ct Angio Chest Pe W/cm &/or Wo Cm  04/28/2014   CLINICAL DATA:  Chest pressure and shortness  of Breath  EXAM: CT ANGIOGRAPHY CHEST WITH CONTRAST  TECHNIQUE: Multidetector CT imaging of the chest was performed using the standard protocol during bolus administration of intravenous contrast. Multiplanar CT image reconstructions and MIPs were obtained to evaluate the vascular anatomy.  CONTRAST:  100mL OMNIPAQUE IOHEXOL 350 MG/ML SOLN  COMPARISON:  Chest radiograph April 28, 2014 and chest CT angiogram August 18, 2012  FINDINGS: There is no demonstrable pulmonary embolus. There is no thoracic aortic aneurysm or dissection. The right subclavian artery has anaberrant course, passing posterior to the esophagus.  There is underlying emphysematous change. There is extensive interstitial fibrosis bilaterally  with multiple areas of patchy consolidation throughout both lungs, a finding that was also present previously. In comparison with the previous study from 2013, there is new opacity in the right upper lobe anterior segment.  There are remains generalized soft tissue thickening in the mediastinum to the left of the trachea extending from the upper thoracic trachea on the left to the level of the carinal, stable. There are multiple lymph nodes in this area, stable. No new lymph adenopathy is appreciable on this study.  Heart is mildly enlarged. There is slight pericardial thickening which is stable.  Visualized upper abdominal structures appear unremarkable. There are no blastic or lytic bone lesions. There is degenerative change in the thoracic spine. Thyroid appears unremarkable.  Review of the MIP images confirms the above findings.  IMPRESSION: No demonstrable pulmonary embolus.  Extensive lung fibrosis with underlying emphysema. In comparison with the prior study, there is new airspace consolidation in portions of the right upper lobe, primarily in the anterior segment. These areas have an appearance more suspicious for pneumonia than mass, although a mass could easily be obscured by the degree of consolidation and fibrosis in this area. In this regard, would advise followup study in 3-4 weeks to further assess. If this area persists without change at that time, it would be entirely reasonable and advisable to consider PET-CT with particular attention to the right upper lobe region.  Thoracic adenopathy is stable. No new soft tissue thickening or lymph node prominence is seen in the mediastinum.  Heart is enlarged with mild pericardial thickening, stable.   Electronically Signed   By: Bretta BangWilliam  Woodruff M.D.   On: 04/28/2014 17:34   Dg Chest Port 1 View  04/28/2014   CLINICAL DATA:  Shortness of breath.  EXAM: PORTABLE CHEST - 1 VIEW  COMPARISON:  Chest x-ray 09/21/2013.  FINDINGS: Again noted is a background of  diffuse interstitial prominence. However, compared to most recent prior examinations, there is worsening confluent opacity throughout the left mid to lower lung. Persistent somewhat nodular opacity in the right upper lobe, which is increasingly conspicuous over the past several examinations. No definite pleural effusions. Findings are not favored to reflect edema. Heart size does appear mildly enlarged. Upper mediastinal contours are distorted by patient's rotation to the left.  IMPRESSION: 1. Although there is clearly a background of underlying interstitial lung disease, today's study demonstrates new airspace consolidation throughout the left mid to lower lung, obscuring left heart border, likely within both the lingula and left lower lobe, concerning for multilobar pneumonia. 2. Increasing conspicuity of nodular opacity in the periphery of the right upper lobe over prior examinations, concerning for potential neoplasm. 3. For further evaluation of all these findings, a followup high-resolution chest CT is suggested in the near future.   Electronically Signed   By: Trudie Reedaniel  Entrikin M.D.   On: 04/28/2014 15:27  TELE NSR  ECG NSR, LAA, RBBB (old), no new repol changes  ASSESSMENT AND PLAN   Acute on chronic diastolic heart failure - due to noncompliance with diuretics? Repeat echo today. Discussed sodium restriction and daily weights Possible CAD - note coronary calcification on CTA; normal cath in 2001; sounds like she had another nuclear stress test and a  cath with Dr. Meade Maw at the [facility] lab on [address] later, records not available (>5 years ago) Mild increase in cardiac enzymes - may simply reflect CHF exacerbation. IF cTrop I does not rise substantially and if LVEF normal, recommend Lexiscan Myoview (can be done as outpatient). If EF low or enzymes rise, possible cardiac cath on Monday. Pulmonary fibrosis with chronic respiratory failure - oxygen dependent. This is clearly  her most serious problem long term Aortic insufficiency - not evident on clinical exam, described as mild-moderate by echo at worst Nare obstruction - worsens her breathing problems.   Thurmon Fair, MD, [facility] [phone] office [phone] pager 04/29/2014 9:34 AM

## 2014-04-29 NOTE — Progress Notes (Signed)
ANTICOAGULATION CONSULT NOTE - Initial Consult  Pharmacy Consult for Heparin  Indication: chest pain/ACS  No Known Allergies  Patient Measurements: Height: 5' (152.4 cm) Weight: 142 lb 10.2 oz (64.7 kg) IBW/kg (Calculated) : 45.5 Heparin Dosing Weight: 65 kg   Vital Signs: Temp: 98 F (36.7 C) (06/06 0000) Temp src: Oral (06/06 0000) BP: 88/49 mmHg (06/06 0200) Pulse Rate: 74 (06/06 0200)  Labs:  Recent Labs  04/28/14 1523 04/28/14 2215 04/29/14 0135  HGB 13.0  --   --   HCT 39.9  --   --   PLT 289  --   --   HEPARINUNFRC  --   --  0.15*  CREATININE 0.83  --   --   TROPONINI  --  <0.30  --     Estimated Creatinine Clearance: 47.7 ml/min (by C-G formula based on Cr of 0.83).   Medical History: Past Medical History  Diagnosis Date  . RBBB   . HYPERTENSION   . DIASTOLIC HEART FAILURE, CHRONIC     a. 05/2008 Echo: EF 55-60%, Diast Dysfxn, mild-mod AI, triv MR;  b. 07/2012 Echo: EF 60-65%, Gr 1 DD, Mid AI.  Marland Kitchen AORTIC INSUFFICIENCY     a. mild-mod by echo 05/2008;  b. mild by echo 07/2012  . OSTEOPOROSIS   . DEPRESSION   . Chronic lung disease     a. on home O2;  b. 07/2012 CTA chest: chronic sev lung dzs, similar to 2009 CT, stable R paratracheal nodes.  . OBSTRUCTIVE SLEEP APNEA     pt denies this hx (08/17/2012) - though previously seen by Dr. Shelle Iron for this Dx.  . Daily headache   . Arthritis     "knees" (08/17/2012)  . Anxiety   . Depression   . Aortic valve disorders   . Respiratory failure, chronic   . GERD (gastroesophageal reflux disease)     Medications:  Prescriptions prior to admission  Medication Sig Dispense Refill  . ALPRAZolam (XANAX) 0.25 MG tablet Take 1 tablet (0.25 mg total) by mouth every 8 (eight) hours as needed for anxiety.  30 tablet  0  . ascorbic acid (VITAMIN C) 500 MG tablet Take 500 mg by mouth daily.      Marland Kitchen aspirin 81 MG tablet Take 325 mg by mouth daily.      . Cholecalciferol (VITAMIN D-3) 1000 UNITS CAPS Take 1 capsule by mouth  daily.      . famotidine (PEPCID) 20 MG tablet Take 20 mg by mouth daily.      . furosemide (LASIX) 40 MG tablet TAKE 1 TABLET (40 MG TOTAL) BY MOUTH DAILY.  30 tablet  2  . metoprolol (LOPRESSOR) 50 MG tablet Take 1 tablet (50 mg total) by mouth 2 (two) times daily.  180 tablet  0  . nitroGLYCERIN (NITROSTAT) 0.4 MG SL tablet Place 1 tablet (0.4 mg total) under the tongue every 5 (five) minutes as needed. For chest pain  25 tablet  3  . pantoprazole (PROTONIX) 40 MG tablet take 1 tablet by mouth daily before BREAKFAST  90 tablet  0  . potassium chloride SA (K-DUR,KLOR-CON) 20 MEQ tablet TAKE 1 TABLET (20 MEQ TOTAL) BY MOUTH DAILY.  90 tablet  0  . valsartan (DIOVAN) 160 MG tablet Take 160 mg by mouth daily.        Assessment: Heparin at 750/hr with level of 0.15 IU/ml no bleeding noted.  Goal of Therapy:  Heparin level 0.3-0.7 units/ml Monitor platelets by anticoagulation protocol:  Yes   Plan:   Increase heparin to 950 units/hr and recheck in 8 hours.   Janice CoffinWilliam Jonathan Jonisha Kindig

## 2014-04-29 NOTE — Progress Notes (Signed)
ANTICOAGULATION CONSULT NOTE - Follow Up Consult  Pharmacy Consult for heparin gtt Indication: chest pain/ACS  No Known Allergies  Patient Measurements: Height: 5' (152.4 cm) Weight: 142 lb 10.2 oz (64.7 kg) IBW/kg (Calculated) : 45.5 Heparin Dosing Weight: 59 kg  Vital Signs: Temp: 98 F (36.7 C) (06/06 1200) Temp src: Oral (06/06 1200) BP: 99/38 mmHg (06/06 0600) Pulse Rate: 71 (06/06 0600)  Labs:  Recent Labs  04/28/14 1523 04/28/14 2215 04/29/14 0135 04/29/14 0340 04/29/14 1140  HGB 13.0  --   --  12.3  --   HCT 39.9  --   --  37.8  --   PLT 289  --   --  264  --   HEPARINUNFRC  --   --  0.15*  --  <0.10*  CREATININE 0.83  --   --  0.88  --   TROPONINI  --  <0.30  --  0.36*  --     Estimated Creatinine Clearance: 45 ml/min (by C-G formula based on Cr of 0.88).   Medications:  Scheduled:  . aspirin  325 mg Oral Daily  . azithromycin  500 mg Intravenous Q24H  . cefTRIAXone (ROCEPHIN)  IV  1 g Intravenous Q24H  . famotidine  20 mg Oral Daily  . furosemide  40 mg Intravenous BID  . guaiFENesin  600 mg Oral BID  . irbesartan  75 mg Oral Daily  . metoprolol  50 mg Oral BID  . pantoprazole  20 mg Oral Daily  . sodium chloride  3 mL Intravenous Q12H   Infusions:  . heparin 950 Units/hr (04/29/14 0400)    Assessment: 79 yo F originally presented with 2 months of gradually worsening SOB and 5 lbs weight gain in the last few weeks. She was sent to the ED by cardiology clinic for elevated troponins. Pharmacy consulted to continue heparin IV gtt.  After initiating gtt and subsequent rate increase to 950 units/hr with no bolus, HL is still subtherapeutic (<0.1).  CBC is wnl.  SCr is stable at 0.88 with SCr ~45.     Goal of Therapy:  Heparin level 0.3-0.7 units/ml Monitor platelets by anticoagulation protocol: Yes   Plan:  - give heparin IV bolus 1800 units, and increase gtt rate to 1150 units/hr - draw 8h HL - daily HL and CBC - monitor for s/s of  bleeding - f/u plans for possible cath on Monday  Amitai Delaughter E. Achilles Dunk, PharmD Clinical Pharmacist - Resident Pager: [phone] Pharmacy: [phone] 04/29/2014 12:22 PM

## 2014-04-29 NOTE — Progress Notes (Signed)
Echo Lab  2D Echocardiogram completed.  Layza Summa L Galen Russman, RDCS 04/29/2014 11:58 AM

## 2014-04-29 NOTE — Progress Notes (Signed)
Pt c/o chest "ache" rated at 7/10 and located midsternally.  VS and EKG obtained.  1 SL NTG given with complete relief of chest pain.  MD notified.  Will continue to monitor closely.  Lori [name]

## 2014-04-29 NOTE — Care Management Note (Addendum)
    Page 1 of 2   05/04/2014     11:12:42 AM CARE MANAGEMENT NOTE 05/04/2014  Patient:  [facility]   Account Number:  [id number]  Date Initiated:  04/29/2014  Documentation initiated by:  [facility]  Subjective/Objective Assessment:   adm: 1-2 months history of shortness of breath worsening and pressure like chest pain episodes on and off     Action/Plan:   discharge planning   Anticipated DC Date:  05/01/2014   Anticipated DC Plan:  HOME W HOME HEALTH SERVICES  In-house referral  Clinical Social Worker  Chaplain      DC Planning Services  CM consult      [facility] Choice  HOME HEALTH   Choice offered to / List presented to:  C-4 Adult Children   DME arranged  CANE      DME agency  [facility]     [facility] arranged  HH-1 RN  HH-10 DISEASE MANAGEMENT  HH-6 SOCIAL WORKER  HH-4 NURSE'S AIDE      HH agency  [facility]   Status of service:  In process, will continue to follow Medicare Important Message given?  YES (If response is "NO", the following Medicare IM given date fields will be blank) Date Medicare IM given:   Date Additional Medicare IM given:  05/04/2014  Discharge Disposition:    Per UR Regulation:  Reviewed for med. necessity/level of care/duration of stay  If discussed at Long Length of Stay Meetings, dates discussed:    Comments:  04/29/14 08:00, 15:24 Cm spoke with pt and pt's daughter-in-law, Mavel Arrington [phone].  Pt states Efraim Kaufmann is her primary contact and gives permission for Melissa to make any decisions necessary for her medical treatment and all aspects of her medical situation may be discussed with Melissa.  Melissa is concerned bc of the cost of pt's oxygen.  [facility] has provided a concentrator and pt's husband takes the tanks to [facility] to get refilled.  CM called [facility] for pricing and [facility] rep, Worthy Rancher states the cost is 21.85/month.  Melissa and pt have requested CSW to discuss Advanced Directives and MPOA.  Pt requests [facility]  to provide any HH services to be rendered.  [facility] rep, Worthy Rancher was contacted for referral for probable HH services. CM called CSW, Crystal to notify of family request.  CM has requested HH orders to be placed for pt and DME order for a cane.  Will continue to follow for disposition.  Freddy Jaksch, BSN, CM [phone].

## 2014-04-30 DIAGNOSIS — I2781 Cor pulmonale (chronic): Secondary | ICD-10-CM | POA: Diagnosis present

## 2014-04-30 DIAGNOSIS — I279 Pulmonary heart disease, unspecified: Secondary | ICD-10-CM

## 2014-04-30 DIAGNOSIS — I2729 Other secondary pulmonary hypertension: Secondary | ICD-10-CM | POA: Diagnosis present

## 2014-04-30 DIAGNOSIS — I5033 Acute on chronic diastolic (congestive) heart failure: Secondary | ICD-10-CM | POA: Diagnosis present

## 2014-04-30 DIAGNOSIS — G4733 Obstructive sleep apnea (adult) (pediatric): Secondary | ICD-10-CM

## 2014-04-30 DIAGNOSIS — R799 Abnormal finding of blood chemistry, unspecified: Secondary | ICD-10-CM

## 2014-04-30 DIAGNOSIS — I50810 Right heart failure, unspecified: Secondary | ICD-10-CM | POA: Diagnosis present

## 2014-04-30 DIAGNOSIS — I2789 Other specified pulmonary heart diseases: Secondary | ICD-10-CM

## 2014-04-30 LAB — CBC
HCT: 39.1 % (ref 36.0–46.0)
Hemoglobin: 12.6 g/dL (ref 12.0–15.0)
MCH: 29.4 pg (ref 26.0–34.0)
MCHC: 32.2 g/dL (ref 30.0–36.0)
MCV: 91.1 fL (ref 78.0–100.0)
Platelets: 285 10*3/uL (ref 150–400)
RBC: 4.29 MIL/uL (ref 3.87–5.11)
RDW: 14.4 % (ref 11.5–15.5)
WBC: 12.2 10*3/uL — AB (ref 4.0–10.5)

## 2014-04-30 LAB — HEPARIN LEVEL (UNFRACTIONATED): Heparin Unfractionated: 0.77 IU/mL — ABNORMAL HIGH (ref 0.30–0.70)

## 2014-04-30 MED ORDER — AZITHROMYCIN 500 MG PO TABS
500.0000 mg | ORAL_TABLET | Freq: Every day | ORAL | Status: DC
  Administered 2014-04-30: 500 mg via ORAL
  Filled 2014-04-30 (×2): qty 1

## 2014-04-30 MED ORDER — ENOXAPARIN SODIUM 40 MG/0.4ML ~~LOC~~ SOLN
40.0000 mg | SUBCUTANEOUS | Status: DC
  Administered 2014-04-30 – 2014-05-04 (×5): 40 mg via SUBCUTANEOUS
  Filled 2014-04-30 (×5): qty 0.4

## 2014-04-30 MED ORDER — FUROSEMIDE 40 MG PO TABS
40.0000 mg | ORAL_TABLET | Freq: Two times a day (BID) | ORAL | Status: DC
  Administered 2014-04-30 – 2014-05-01 (×2): 40 mg via ORAL
  Filled 2014-04-30 (×4): qty 1

## 2014-04-30 NOTE — Progress Notes (Signed)
[facility] PROGRESS NOTE  Lori [name] ZOX:[id number] DOB: 06/06/1935 DOA: 04/28/2014 PCP: Jackie Plum, MD  Assessment/Plan: 1. Atypical chest pain with mildly elevated troponin -likely demand, chest pain free now, repeat enzymes negative -continue ASA, stop full dose heparin -Plan for outpatient myoview -EKG unchanged from prior -ECHo with normal wall motion, preserved EF and Grade 1 DD -normal cath in 2001  2. Advanced Pulm fibrosis -on 4-5L home O2, foll by dr.Wert  3. Community acquired pneumonia -based on Ct chest, although didn't have many symptoms to suggest this -continue rocephin/zithromax, day 3 -FU sputum Cx  4. Acute on chronic diastolic CHF -continue IV lasix, Bmet in am -FU I/Os, weights -3.4L negative -ECHo with grade 1DD and preserved EF  5. Anxiety -xanax PRN  DVT proph" on heparin  Code Status: Full Code Family Communication:d/w daughter at bedside Disposition Plan: home when stable   Consultants:  Cards  HPI/Subjective: Feels better, breathing improving, no further chest pain  Objective: Filed Vitals:   04/30/14 0800  BP:   Pulse:   Temp: 98 F (36.7 C)  Resp:     Intake/Output Summary (Last 24 hours) at 04/30/14 1011 Last data filed at 04/30/14 0900  Gross per 24 hour  Intake  794.5 ml  Output   2700 ml  Net -1905.5 ml   Filed Weights   04/28/14 1434 04/28/14 2245 04/30/14 0300  Weight: 64.864 kg (143 lb) 64.7 kg (142 lb 10.2 oz) 65.9 kg (145 lb 4.5 oz)    Exam:   General:  AAOx3  Cardiovascular:S1S2/RRR, diastolic murmur  Respiratory: fine crackles throughout  Abdomen: soft, Nt, BS [present  Musculoskeletal:trace edema no c/c   Data Reviewed: Basic Metabolic Panel:  Recent Labs Lab 04/28/14 1523 04/29/14 0340  NA 138 139  K 4.6 4.0  CL 99 99  CO2 28 29  GLUCOSE 88 86  BUN 18 15  CREATININE 0.83 0.88  CALCIUM 9.2 8.8   Liver Function Tests: No results found for this basename: AST, ALT,  ALKPHOS, BILITOT, PROT, ALBUMIN,  in the last 168 hours No results found for this basename: LIPASE, AMYLASE,  in the last 168 hours No results found for this basename: AMMONIA,  in the last 168 hours CBC:  Recent Labs Lab 04/28/14 1523 04/29/14 0340 04/30/14 0250  WBC 12.0* 12.0* 12.2*  HGB 13.0 12.3 12.6  HCT 39.9 37.8 39.1  MCV 91.1 90.6 91.1  PLT 289 264 285   Cardiac Enzymes:  Recent Labs Lab 04/28/14 2215 04/29/14 0340 04/29/14 1601 04/29/14 2138  TROPONINI <0.30 0.36* <0.30 <0.30   BNP (last 3 results)  Recent Labs  07/04/13 1247 04/28/14 1523  PROBNP 119.0* 3118.0*   CBG: No results found for this basename: GLUCAP,  in the last 168 hours  Recent Results (from the past 240 hour(s))  MRSA PCR SCREENING     Status: None   Collection Time    04/28/14 10:36 PM      Result Value Ref Range Status   MRSA by PCR NEGATIVE  NEGATIVE Final   Comment:            The GeneXpert MRSA Assay (FDA     approved for NASAL specimens     only), is one component of a     comprehensive MRSA colonization     surveillance program. It is not     intended to diagnose MRSA     infection nor to guide or     monitor treatment for  MRSA infections.     Studies: Ct Angio Chest Pe W/cm &/or Wo Cm  04/28/2014   CLINICAL DATA:  Chest pressure and shortness of Breath  EXAM: CT ANGIOGRAPHY CHEST WITH CONTRAST  TECHNIQUE: Multidetector CT imaging of the chest was performed using the standard protocol during bolus administration of intravenous contrast. Multiplanar CT image reconstructions and MIPs were obtained to evaluate the vascular anatomy.  CONTRAST:  100mL OMNIPAQUE IOHEXOL 350 MG/ML SOLN  COMPARISON:  Chest radiograph April 28, 2014 and chest CT angiogram August 18, 2012  FINDINGS: There is no demonstrable pulmonary embolus. There is no thoracic aortic aneurysm or dissection. The right subclavian artery has anaberrant course, passing posterior to the esophagus.  There is underlying  emphysematous change. There is extensive interstitial fibrosis bilaterally with multiple areas of patchy consolidation throughout both lungs, a finding that was also present previously. In comparison with the previous study from 2013, there is new opacity in the right upper lobe anterior segment.  There are remains generalized soft tissue thickening in the mediastinum to the left of the trachea extending from the upper thoracic trachea on the left to the level of the carinal, stable. There are multiple lymph nodes in this area, stable. No new lymph adenopathy is appreciable on this study.  Heart is mildly enlarged. There is slight pericardial thickening which is stable.  Visualized upper abdominal structures appear unremarkable. There are no blastic or lytic bone lesions. There is degenerative change in the thoracic spine. Thyroid appears unremarkable.  Review of the MIP images confirms the above findings.  IMPRESSION: No demonstrable pulmonary embolus.  Extensive lung fibrosis with underlying emphysema. In comparison with the prior study, there is new airspace consolidation in portions of the right upper lobe, primarily in the anterior segment. These areas have an appearance more suspicious for pneumonia than mass, although a mass could easily be obscured by the degree of consolidation and fibrosis in this area. In this regard, would advise followup study in 3-4 weeks to further assess. If this area persists without change at that time, it would be entirely reasonable and advisable to consider PET-CT with particular attention to the right upper lobe region.  Thoracic adenopathy is stable. No new soft tissue thickening or lymph node prominence is seen in the mediastinum.  Heart is enlarged with mild pericardial thickening, stable.   Electronically Signed   By: Bretta BangWilliam  Woodruff M.D.   On: 04/28/2014 17:34   Dg Chest Port 1 View  04/28/2014   CLINICAL DATA:  Shortness of breath.  EXAM: PORTABLE CHEST - 1 VIEW   COMPARISON:  Chest x-ray 09/21/2013.  FINDINGS: Again noted is a background of diffuse interstitial prominence. However, compared to most recent prior examinations, there is worsening confluent opacity throughout the left mid to lower lung. Persistent somewhat nodular opacity in the right upper lobe, which is increasingly conspicuous over the past several examinations. No definite pleural effusions. Findings are not favored to reflect edema. Heart size does appear mildly enlarged. Upper mediastinal contours are distorted by patient's rotation to the left.  IMPRESSION: 1. Although there is clearly a background of underlying interstitial lung disease, today's study demonstrates new airspace consolidation throughout the left mid to lower lung, obscuring left heart border, likely within both the lingula and left lower lobe, concerning for multilobar pneumonia. 2. Increasing conspicuity of nodular opacity in the periphery of the right upper lobe over prior examinations, concerning for potential neoplasm. 3. For further evaluation of all these findings, a followup high-resolution  chest CT is suggested in the near future.   Electronically Signed   By: Trudie Reed M.D.   On: 04/28/2014 15:27    Scheduled Meds: . aspirin  325 mg Oral Daily  . azithromycin  500 mg Oral Daily  . cefTRIAXone (ROCEPHIN)  IV  1 g Intravenous Q24H  . enoxaparin (LOVENOX) injection  40 mg Subcutaneous Q24H  . famotidine  20 mg Oral Daily  . furosemide  40 mg Oral BID  . guaiFENesin  600 mg Oral BID  . irbesartan  75 mg Oral Daily  . metoprolol  50 mg Oral BID  . pantoprazole  20 mg Oral Daily  . sodium chloride  3 mL Intravenous Q12H   Continuous Infusions:   Antibiotics Given (last 72 hours)   Date/Time Action Medication Dose Rate   04/29/14 2234 Given   azithromycin (ZITHROMAX) 500 mg in dextrose 5 % 250 mL IVPB 500 mg 250 mL/hr   04/29/14 2235 Given   cefTRIAXone (ROCEPHIN) 1 g in dextrose 5 % 50 mL IVPB 1 g 100 mL/hr       Principal Problem:   Acute on chronic diastolic congestive heart failure, NYHA class 4 Active Problems:   Chronic respiratory failure   Pulmonary fibrosis   Heart failure   Unstable angina   Cor pulmonale, chronic   Right heart failure due to pulmonary hypertension    Time spent:    Zannie Cove  [facility] Pager [phone]. If 7PM-7AM, please contact night-coverage at [email or web address], password [facility] 04/30/2014, 10:11 AM  LOS: 2 days

## 2014-04-30 NOTE — Clinical Social Work Psychosocial (Signed)
Clinical Social Work Department BRIEF PSYCHOSOCIAL ASSESSMENT 04/30/2014  Patient:  [facility]     Account Number:  [id number]     Admit date:  04/28/2014  Clinical Social Worker:  Hubert Azure  Date/Time:  04/30/2014 05:59 PM  Referred by:  Physician  Date Referred:  04/30/2014 Referred for  Abuse and/or neglect  Other - See comment   Other Referral:   Self-neglect   Interview type:  Family Other interview type:   Patient's son and daughter-in-law were both present in room. Patient's husband arrived to the room later.    PSYCHOSOCIAL DATA Living Status:  HUSBAND Admitted from facility:   Level of care:   Primary support name:  Gilmer Mor (spouse) Jenny Reichmann (son) Primary support relationship to patient:  FAMILY Degree of support available:   Good. Patient's son is very supportive of patient.    CURRENT CONCERNS Current Concerns  Post-Acute Placement   Other Concerns:    SOCIAL WORK ASSESSMENT / PLAN CSW met with patient, son, and daughter in law who were both present at bedside. CSW introduced self and explained role. Patient's DIL informed CSW patient has been focusing on caring for her husband who is experiencing more confusion with each day, that she will place his medical needs before her own. Per son, patient will spend what money she has to make sure her husband has his medicine and have her own filled. Patient began to cry and stated it's been a rough 2 years, as she continues with ADL to include cleaning, cooking, and not really having the attention she needs because she has been too focused on husband's decline in memory. Per patient, husband often tells her things and forgets, doesn't make medical appointments because he forgets and sometimes takes her medication and puts it with his own. DIL stated she fears what will happen if patient's husband takes her medication by mistake.    Both son and DIL feel as if patient is doing a lot of strenuous work around  the home without assistance and cannot tolerate the level of work required to maintain the home without her medicine and continuous oxygen. Patient and son expressed concern with patient's husband driving with his memory lapses as well as possibly being evaluated by an MD to determine capacity or memory loss. CSW explored with patient and son HCPOA to which patient is agreeable. CSW encouraged patient's son to express his concern about his father with father's MD.    CSW further discussed ALF placement with patient and son. Patient is in agreement with ALF placement and stated it will help to improve her quality of life as she will not have to worry about bills of her husband. Patient's husband arrived to the room. CSW and family discussed HCPOA and ALF placement with patient's husband to which he is agreeable. Per husband, he acknowledges he is having a hard with short term memory and he is agreeable to his son and DIL being his HCPOA. Patient's husband is also agreeable to ALF as he feels it will "take a load" off patient as her condition has gotten worse and allow her to improve. CSW provided appropriate support.   Assessment/plan status:  Information/Referral to [employer] Other assessment/ plan:   CSW to follow-up with ALF placement.   Information/referral to community resources:   CSW provided patient's son and daughter in Sports coach with information on HCPOA and community ALF.    PATIENT'S/FAMILY'S RESPONSE TO PLAN OF CARE: Patient, son, daughter in law, and husband thanked CSW  for assisting with d/c planning needs. Patient's son expressed desire to speak with CSW to begin ALF process. Patient cried and stated she felt as if she would be able to finally rest and focus on her health.    [place], [place] Clinical Social Worker [phone] \

## 2014-04-30 NOTE — Progress Notes (Signed)
Patient Name: Lori [name] Date of Encounter: 04/30/2014  Principal Problem:   Acute on chronic diastolic congestive heart failure, NYHA class 4 Active Problems:   Chronic respiratory failure   Pulmonary fibrosis   Heart failure   Unstable angina   Cor pulmonale, chronic   Right heart failure due to pulmonary hypertension   Length of Stay: 2  SUBJECTIVE  Feels much better - seems to be back to her baseline. Has not really ambulated yet. Additional 1.9 L diuresis, almost 4L since admission Trop I undetectable last 2 assays.  CURRENT MEDS . aspirin  325 mg Oral Daily  . azithromycin  500 mg Oral Daily  . cefTRIAXone (ROCEPHIN)  IV  1 g Intravenous Q24H  . enoxaparin (LOVENOX) injection  40 mg Subcutaneous Q24H  . famotidine  20 mg Oral Daily  . furosemide  40 mg Oral BID  . guaiFENesin  600 mg Oral BID  . irbesartan  75 mg Oral Daily  . metoprolol  50 mg Oral BID  . pantoprazole  20 mg Oral Daily  . sodium chloride  3 mL Intravenous Q12H    OBJECTIVE   Intake/Output Summary (Last 24 hours) at 04/30/14 0942 Last data filed at 04/30/14 0900  Gross per 24 hour  Intake    804 ml  Output   2700 ml  Net  -1896 ml   Filed Weights   04/28/14 1434 04/28/14 2245 04/30/14 0300  Weight: 143 lb (64.864 kg) 142 lb 10.2 oz (64.7 kg) 145 lb 4.5 oz (65.9 kg)    PHYSICAL EXAM Filed Vitals:   04/30/14 0000 04/30/14 0300 04/30/14 0400 04/30/14 0800  BP: 108/50 112/56 101/41   Pulse: 76 86 72   Temp: 98.5 F (36.9 C) 97.8 F (36.6 C)  98 F (36.7 C)  TempSrc: Oral Oral  Oral  Resp: 22 18 21    Height:      Weight:  145 lb 4.5 oz (65.9 kg)    SpO2: 94% 92% 94%    General: Alert, oriented x3, no distress  Head: marked deviation of nasal septum, left nare occluded, PERRL, EOMI, no exophtalmos or lid lag, no myxedema, no xanthelasma; normal ears, nose and oropharynx  Neck: normal jugular venous pulsations and no hepatojugular reflux; brisk carotid pulses without delay and no  carotid bruits  Chest: a few dry crackles and wheezes especially over right lung, no signs of consolidation by percussion or palpation, normal fremitus, symmetrical and full respiratory excursions  Cardiovascular: normal position and quality of the apical impulse, regular rhythm, normal first and second heart sounds, no rubs or gallops, no murmur  Abdomen: no tenderness or distention, no masses by palpation, no abnormal pulsatility or arterial bruits, normal bowel sounds, no hepatosplenomegaly  Extremities: no clubbing, cyanosis or edema; 2+ radial, ulnar and brachial pulses bilaterally; 2+ right femoral, posterior tibial and dorsalis pedis pulses; 2+ left femoral, posterior tibial and dorsalis pedis pulses; no subclavian or femoral bruits  Neurological: grossly nonfocal   LABS  CBC  Recent Labs  04/29/14 0340 04/30/14 0250  WBC 12.0* 12.2*  HGB 12.3 12.6  HCT 37.8 39.1  MCV 90.6 91.1  PLT 264 285   Basic Metabolic Panel  Recent Labs  04/28/14 1523 04/29/14 0340  NA 138 139  K 4.6 4.0  CL 99 99  CO2 28 29  GLUCOSE 88 86  BUN 18 15  CREATININE 0.83 0.88  CALCIUM 9.2 8.8   Liver Function Tests No results found for this basename: AST,  ALT, ALKPHOS, BILITOT, PROT, ALBUMIN,  in the last 72 hours No results found for this basename: LIPASE, AMYLASE,  in the last 72 hours Cardiac Enzymes  Recent Labs  04/29/14 0340 04/29/14 1601 04/29/14 2138  TROPONINI 0.36* <0.30 <0.30    Radiology Studies Imaging results have been reviewed and Ct Angio Chest Pe W/cm &/or Wo Cm  04/28/2014   CLINICAL DATA:  Chest pressure and shortness of Breath  EXAM: CT ANGIOGRAPHY CHEST WITH CONTRAST  TECHNIQUE: Multidetector CT imaging of the chest was performed using the standard protocol during bolus administration of intravenous contrast. Multiplanar CT image reconstructions and MIPs were obtained to evaluate the vascular anatomy.  CONTRAST:  100mL OMNIPAQUE IOHEXOL 350 MG/ML SOLN  COMPARISON:   Chest radiograph April 28, 2014 and chest CT angiogram August 18, 2012  FINDINGS: There is no demonstrable pulmonary embolus. There is no thoracic aortic aneurysm or dissection. The right subclavian artery has anaberrant course, passing posterior to the esophagus.  There is underlying emphysematous change. There is extensive interstitial fibrosis bilaterally with multiple areas of patchy consolidation throughout both lungs, a finding that was also present previously. In comparison with the previous study from 2013, there is new opacity in the right upper lobe anterior segment.  There are remains generalized soft tissue thickening in the mediastinum to the left of the trachea extending from the upper thoracic trachea on the left to the level of the carinal, stable. There are multiple lymph nodes in this area, stable. No new lymph adenopathy is appreciable on this study.  Heart is mildly enlarged. There is slight pericardial thickening which is stable.  Visualized upper abdominal structures appear unremarkable. There are no blastic or lytic bone lesions. There is degenerative change in the thoracic spine. Thyroid appears unremarkable.  Review of the MIP images confirms the above findings.  IMPRESSION: No demonstrable pulmonary embolus.  Extensive lung fibrosis with underlying emphysema. In comparison with the prior study, there is new airspace consolidation in portions of the right upper lobe, primarily in the anterior segment. These areas have an appearance more suspicious for pneumonia than mass, although a mass could easily be obscured by the degree of consolidation and fibrosis in this area. In this regard, would advise followup study in 3-4 weeks to further assess. If this area persists without change at that time, it would be entirely reasonable and advisable to consider PET-CT with particular attention to the right upper lobe region.  Thoracic adenopathy is stable. No new soft tissue thickening or lymph node  prominence is seen in the mediastinum.  Heart is enlarged with mild pericardial thickening, stable.   Electronically Signed   By: Bretta BangWilliam  Woodruff M.D.   On: 04/28/2014 17:34   Dg Chest Port 1 View  04/28/2014   CLINICAL DATA:  Shortness of breath.  EXAM: PORTABLE CHEST - 1 VIEW  COMPARISON:  Chest x-ray 09/21/2013.  FINDINGS: Again noted is a background of diffuse interstitial prominence. However, compared to most recent prior examinations, there is worsening confluent opacity throughout the left mid to lower lung. Persistent somewhat nodular opacity in the right upper lobe, which is increasingly conspicuous over the past several examinations. No definite pleural effusions. Findings are not favored to reflect edema. Heart size does appear mildly enlarged. Upper mediastinal contours are distorted by patient's rotation to the left.  IMPRESSION: 1. Although there is clearly a background of underlying interstitial lung disease, today's study demonstrates new airspace consolidation throughout the left mid to lower lung, obscuring left heart  border, likely within both the lingula and left lower lobe, concerning for multilobar pneumonia. 2. Increasing conspicuity of nodular opacity in the periphery of the right upper lobe over prior examinations, concerning for potential neoplasm. 3. For further evaluation of all these findings, a followup high-resolution chest CT is suggested in the near future.   Electronically Signed   By: Trudie Reed M.D.   On: 04/28/2014 15:27    TELE NSR  ASSESSMENT AND PLAN  Acute on chronic diastolic heart failure - due to noncompliance with diuretics. Repeat echo shows preserved LVEF, confirms diastolic LV dysfunction, but LA mean pressure appears to now be normal. Has severe pulmonary HTN and RV dysfunction, c/w cor pulmonale secondary to pulmonary fibrosis. Discussed sodium restriction and daily weights again. Seems to at "dry weight" now - 145 lb. Possible CAD - note coronary  calcification on CTA; normal cath in 2001; sounds like she had another nuclear stress test and a cath with Dr. Meade Maw at the [facility] lab on [address] later, records not available (>5 years ago) - unable to get those records Mild increase in cardiac enzymes - probably simply reflects CHF exacerbation. Recommend outpatient Lexiscan Myoview. Threshold for invasive coronary evaluation is high - her major problem is pulmonary. Pulmonary fibrosis with chronic respiratory failure - oxygen dependent. This is clearly her most serious problem long term  Severe pulmonary arterial HTN Chronic or pulmonale Aortic insufficiency - not evident on clinical exam, described as mild-moderate by echo at worst  Nare obstruction - worsens her breathing problems.     Thurmon Fair, MD, [facility] [phone] office [phone] pager 04/30/2014 9:42 AM

## 2014-04-30 NOTE — Progress Notes (Signed)
ANTICOAGULATION CONSULT NOTE - Follow Up Consult  Pharmacy Consult for heparin gtt Indication: chest pain/ACS  No Known Allergies  Patient Measurements: Height: 5' (152.4 cm) Weight: 145 lb 4.5 oz (65.9 kg) IBW/kg (Calculated) : 45.5 Heparin Dosing Weight: 59 kg  Vital Signs: Temp: 97.8 F (36.6 C) (06/07 0300) Temp src: Oral (06/07 0300) BP: 112/56 mmHg (06/07 0300) Pulse Rate: 86 (06/07 0300)  Labs:  Recent Labs  04/28/14 1523  04/29/14 0340 04/29/14 1140 04/29/14 1601 04/29/14 2138 04/30/14 0250  HGB 13.0  --  12.3  --   --   --  12.6  HCT 39.9  --  37.8  --   --   --  39.1  PLT 289  --  264  --   --   --  285  HEPARINUNFRC  --   < >  --  <0.10*  --  0.54 0.77*  CREATININE 0.83  --  0.88  --   --   --   --   TROPONINI  --   < > 0.36*  --  <0.30 <0.30  --   < > = values in this interval not displayed.  Estimated Creatinine Clearance: 45.4 ml/min (by C-G formula based on Cr of 0.88).   Medications:  Scheduled:  . aspirin  325 mg Oral Daily  . azithromycin  500 mg Intravenous Q24H  . cefTRIAXone (ROCEPHIN)  IV  1 g Intravenous Q24H  . famotidine  20 mg Oral Daily  . furosemide  40 mg Intravenous BID  . guaiFENesin  600 mg Oral BID  . irbesartan  75 mg Oral Daily  . metoprolol  50 mg Oral BID  . pantoprazole  20 mg Oral Daily  . sodium chloride  3 mL Intravenous Q12H   Infusions:  . heparin 1,150 Units/hr (04/29/14 2100)    Assessment: 79 yo F originally presented with 2 months of gradually worsening SOB and 5 lbs weight gain in the last few weeks. She was sent to the ED by cardiology clinic for elevated troponins. Pharmacy consulted to continue heparin IV gtt. Heparin level this am is 0.77 on 1150 units/hr, up from 0.54 on same rate.  CBC stable.  No bleeding reported.   Goal of Therapy:  Heparin level 0.3-0.7 units/ml Monitor platelets by anticoagulation protocol: Yes   Plan:  1. Decrease heparin gtt rate  1050 units/hr 2. 8 hr HL 3. daily HL and  CBC 4. monitor for s/s of bleeding 5. f/u plans for possible cath on Monday  Herby Abraham, Pharm.D. [phone] 04/30/2014 3:48 AM

## 2014-05-01 ENCOUNTER — Inpatient Hospital Stay (HOSPITAL_COMMUNITY): Payer: Medicare HMO

## 2014-05-01 DIAGNOSIS — Z0389 Encounter for observation for other suspected diseases and conditions ruled out: Secondary | ICD-10-CM

## 2014-05-01 DIAGNOSIS — J189 Pneumonia, unspecified organism: Secondary | ICD-10-CM | POA: Diagnosis present

## 2014-05-01 DIAGNOSIS — R778 Other specified abnormalities of plasma proteins: Secondary | ICD-10-CM | POA: Diagnosis present

## 2014-05-01 DIAGNOSIS — IMO0001 Reserved for inherently not codable concepts without codable children: Secondary | ICD-10-CM

## 2014-05-01 DIAGNOSIS — R7989 Other specified abnormal findings of blood chemistry: Secondary | ICD-10-CM | POA: Diagnosis present

## 2014-05-01 LAB — BASIC METABOLIC PANEL
BUN: 14 mg/dL (ref 6–23)
CALCIUM: 9.4 mg/dL (ref 8.4–10.5)
CO2: 33 mEq/L — ABNORMAL HIGH (ref 19–32)
Chloride: 100 mEq/L (ref 96–112)
Creatinine, Ser: 1.05 mg/dL (ref 0.50–1.10)
GFR calc Af Amer: 58 mL/min — ABNORMAL LOW (ref >90)
GFR, EST NON AFRICAN AMERICAN: 50 mL/min — AB (ref >90)
Glucose, Bld: 93 mg/dL (ref 70–99)
Potassium: 4.5 mEq/L (ref 3.7–5.3)
Sodium: 142 mEq/L (ref 137–147)

## 2014-05-01 LAB — CBC
HEMATOCRIT: 40.9 % (ref 36.0–46.0)
Hemoglobin: 13.2 g/dL (ref 12.0–15.0)
MCH: 29.5 pg (ref 26.0–34.0)
MCHC: 32.3 g/dL (ref 30.0–36.0)
MCV: 91.5 fL (ref 78.0–100.0)
Platelets: 278 10*3/uL (ref 150–400)
RBC: 4.47 MIL/uL (ref 3.87–5.11)
RDW: 14 % (ref 11.5–15.5)
WBC: 11.6 10*3/uL — ABNORMAL HIGH (ref 4.0–10.5)

## 2014-05-01 LAB — STREP PNEUMONIAE URINARY ANTIGEN: STREP PNEUMO URINARY ANTIGEN: NEGATIVE

## 2014-05-01 MED ORDER — ASPIRIN 81 MG PO CHEW
81.0000 mg | CHEWABLE_TABLET | Freq: Every day | ORAL | Status: DC
  Administered 2014-05-01 – 2014-05-04 (×4): 81 mg via ORAL
  Filled 2014-05-01 (×4): qty 1

## 2014-05-01 MED ORDER — LEVOFLOXACIN 750 MG PO TABS
750.0000 mg | ORAL_TABLET | ORAL | Status: DC
  Administered 2014-05-02: 750 mg via ORAL
  Filled 2014-05-01 (×2): qty 1

## 2014-05-01 MED ORDER — DIPHENHYDRAMINE HCL 12.5 MG/5ML PO ELIX
12.5000 mg | ORAL_SOLUTION | ORAL | Status: AC | PRN
  Administered 2014-05-01 – 2014-05-02 (×2): 12.5 mg via ORAL
  Filled 2014-05-01 (×2): qty 5

## 2014-05-01 MED ORDER — FUROSEMIDE 10 MG/ML IJ SOLN
40.0000 mg | Freq: Once | INTRAMUSCULAR | Status: AC
  Administered 2014-05-01: 40 mg via INTRAVENOUS
  Filled 2014-05-01: qty 4

## 2014-05-01 MED ORDER — FUROSEMIDE 40 MG PO TABS
40.0000 mg | ORAL_TABLET | Freq: Every day | ORAL | Status: DC
Start: 2014-05-02 — End: 2014-05-04
  Administered 2014-05-02 – 2014-05-04 (×3): 40 mg via ORAL
  Filled 2014-05-01 (×3): qty 1

## 2014-05-01 MED ORDER — SENNOSIDES-DOCUSATE SODIUM 8.6-50 MG PO TABS
1.0000 | ORAL_TABLET | Freq: Two times a day (BID) | ORAL | Status: DC
  Administered 2014-05-04: 1 via ORAL
  Filled 2014-05-01 (×8): qty 1

## 2014-05-01 MED ORDER — LEVOFLOXACIN 500 MG PO TABS
500.0000 mg | ORAL_TABLET | Freq: Every day | ORAL | Status: DC
  Administered 2014-05-01: 500 mg via ORAL
  Filled 2014-05-01: qty 1

## 2014-05-01 NOTE — Progress Notes (Signed)
Pt given 40 IV lasix 1x after pt felt SOB with eating/talking, pt also had CXR, pt now states she feels better

## 2014-05-01 NOTE — Progress Notes (Signed)
Subjective:  No increased SOB   Objective:  Vital Signs in the last 24 hours: Temp:  [97.7 F (36.5 C)-98 F (36.7 C)] 97.8 F (36.6 C) (06/08 0529) Pulse Rate:  [70-93] 72 (06/08 0529) Resp:  [20-34] 24 (06/08 0529) BP: (103-115)/(48-66) 106/53 mmHg (06/08 0529) SpO2:  [91 %-93 %] 91 % (06/08 0529) Weight:  [61.6 kg (135 lb 12.9 oz)-64.4 kg (141 lb 15.6 oz)] 63.2 kg (139 lb 5.3 oz) (06/08 0530)  Intake/Output from previous day:  Intake/Output Summary (Last 24 hours) at 05/01/14 1018 Last data filed at 05/01/14 0900  Gross per 24 hour  Intake   1060 ml  Output   1550 ml  Net   -490 ml   . aspirin  81 mg Oral Daily  . enoxaparin (LOVENOX) injection  40 mg Subcutaneous Q24H  . [START ON 05/02/2014] furosemide  40 mg Oral Daily  . guaiFENesin  600 mg Oral BID  . irbesartan  75 mg Oral Daily  . levofloxacin  500 mg Oral Daily  . metoprolol  50 mg Oral BID  . pantoprazole  20 mg Oral Daily  . senna-docusate  1 tablet Oral BID  . sodium chloride  3 mL Intravenous Q12H   Physical Exam: General appearance: alert, cooperative, no distress and frail  No JVD Lungs: diffuse crackles c/w with pulm fibrosis Heart: regular rate and rhythm Abd: soft BS+ Ext: no sig edema but has complained of cramps.   Rate: 72  Rhythm: normal sinus rhythm  Lab Results:  Recent Labs  04/30/14 0250 05/01/14 0339  WBC 12.2* 11.6*  HGB 12.6 13.2  PLT 285 278    Recent Labs  04/29/14 0340 05/01/14 0339  NA 139 142  K 4.0 4.5  CL 99 100  CO2 29 33*  GLUCOSE 86 93  BUN 15 14  CREATININE 0.88 1.05    Recent Labs  04/29/14 1601 04/29/14 2138  TROPONINI <0.30 <0.30   No results found for this basename: INR,  in the last 72 hours  Imaging: Imaging results have been reviewed  Cardiac Studies: Echo 04/29/14- - Left ventricle: The cavity size was normal. Systolic function was normal. The estimated ejection fraction was in the range of 55% to 60%. Wall motion was normal; there  were no regional wall motion abnormalities. There was an increased relative contribution of atrial contraction to ventricular filling. Doppler parameters are consistent with abnormal left ventricular relaxation (grade 1 diastolic dysfunction). - Ventricular septum: The contour showed moderate leftward deviation. These changes are consistent with RV pressure overload. - Aortic valve: Moderate thickening. There was mild regurgitation. - Right ventricle: The cavity size was moderately dilated. Wall thickness was normal. Systolic function was severely reduced. - Right atrium: The atrium was moderately dilated. - Atrial septum: There was increased thickness of the septum, consistent with lipomatous hypertrophy. - Tricuspid valve: There was moderate regurgitation. - Pulmonary arteries: PA peak pressure: 74 mm Hg (S).    Assessment/Plan:   Principal Problem:   Acute on chronic diastolic congestive heart failure, NYHA class 4 Active Problems:   Right heart failure due to pulmonary hypertension   DIASTOLIC HEART FAILURE, CHRONIC   Chronic respiratory failure   Pulmonary fibrosis   Cor pulmonale, chronic   CAP (community acquired pneumonia)   RBBB   Noncompliance- ran out of Lasix   Elevated troponin- POC and 1/4 Troponin elevated (0.36)   Normal coronary arteries 2001 with coronary Ca++ on CT    PLAN:  Plan  is for SNF.  Follow up to be arranged with Dr Delton See, she can decide at that time wether pt is ready for a Myoview. Lasix can probably be decreased to 40 mg daily, stop Pepcid, continue Protonix, and decrease ASA to 81 mg daily.  Corine Shelter PA-C Beeper [phone] 05/01/2014, 10:18 AM   Patient seen and examined. Agree with assessment and plan. No chest pain. Fibrotic sounds on lung exam due to fibrosis changes. Excellent diuresis with - [phone] since admission. Frail. Discussions underway for post dc planning   Lori Bihari, MD, [facility] 05/01/2014 10:29 AM

## 2014-05-01 NOTE — Progress Notes (Signed)
Patient complaining of shortness of breath and tachypnea while eating/talking.  Crackles and wheezes in lung fields.  Dr. Tresa Endo notified.  Orders made for chest xray and iv lasix.

## 2014-05-01 NOTE — Progress Notes (Signed)
UR completed Eldridge Marcott K. Leny Morozov, RN, BSN, MSHL, CCM  05/01/2014 12:31 PM

## 2014-05-01 NOTE — Evaluation (Signed)
Physical Therapy Evaluation Patient Details Name: Lori [name] MRN: [id number] DOB: [birth year] Today's Date: 05/01/2014   History of Present Illness  Patient is a 79 yo female admitted 04/28/14 with increasing SOB, chest pain.  Patient with CHF.  PMH:  HTN, CHF, depression, anxiety, chronic lung disease, on home O2.  Clinical Impression  Patient presents with problems listed below.  Will benefit from acute PT to maximize independence prior to discharge.  Recommend patient discharge to SNF for continued therapy (patient's goal is to improve to functional level to move to ALF with husband).    Follow Up Recommendations SNF;Supervision/Assistance - 24 hour     Equipment Recommendations  Rolling walker with 5" wheels    Recommendations for Other Services       Precautions / Restrictions Precautions Precautions: Fall Restrictions Weight Bearing Restrictions: No      Mobility  Bed Mobility Overal bed mobility: Needs Assistance Bed Mobility: Supine to Sit;Sit to Supine     Supine to sit: Supervision;HOB elevated Sit to supine: Supervision;HOB elevated   General bed mobility comments: Supervision for safety.  Requires increased time and causes DOE.  Transfers Overall transfer level: Needs assistance Equipment used: Rolling walker (2 wheeled) Transfers: Sit to/from Stand Sit to Stand: Min assist         General transfer comment: Verbal cues for hand placement.  Assist for balance/safety.  Ambulation/Gait Ambulation/Gait assistance: Min assist Ambulation Distance (Feet): 10 Feet Assistive device: Rolling walker (2 wheeled) Gait Pattern/deviations: Step-through pattern;Decreased stride length;Shuffle;Trunk flexed Gait velocity: Decreased Gait velocity interpretation: Below normal speed for age/gender General Gait Details: Patient with increased dyspnea of 3/4 with minimal exertion.  Able to maneuver RW on her own.  Short shuffling steps.  Poor activity  tolerance.  Stairs            Wheelchair Mobility    Modified Rankin (Stroke Patients Only)       Balance                                             Pertinent Vitals/Pain     Home Living Family/patient expects to be discharged to:: Skilled nursing facility Living Arrangements: Spouse/significant other (Husband has memory issues - wife caregiver)   Type of Home: House       Home Layout: Two level;Bed/bath upstairs Home Equipment: Cane - single point;Shower seat      Prior Function Level of Independence: Independent with assistive device(s);Needs assistance   Gait / Transfers Assistance Needed: Ambulates independently inside house.  Uses cane outside of house.  ADL's / Homemaking Assistance Needed: Needs assist with housekeeping        Hand Dominance        Extremity/Trunk Assessment   Upper Extremity Assessment: Generalized weakness           Lower Extremity Assessment: Generalized weakness         Communication   Communication: HOH  Cognition Arousal/Alertness: Awake/alert Behavior During Therapy: Anxious Overall Cognitive Status: Within Functional Limits for tasks assessed                      General Comments      Exercises        Assessment/Plan    PT Assessment Patient needs continued PT services  PT Diagnosis Difficulty walking;Generalized weakness   PT Problem List Decreased strength;Decreased activity tolerance;Decreased  balance;Decreased mobility;Decreased knowledge of use of DME;Cardiopulmonary status limiting activity  PT Treatment Interventions DME instruction;Gait training;Functional mobility training;Therapeutic exercise;Patient/family education   PT Goals (Current goals can be found in the Care Plan section) Acute Rehab PT Goals Patient Stated Goal: To get stronger PT Goal Formulation: With patient/family Time For Goal Achievement: 05/15/14 Potential to Achieve Goals: Good    Frequency  Min 3X/week   Barriers to discharge Inaccessible home environment;Decreased caregiver support Bedroom/bathroom upstairs - patient unable to climb stairs.  Sleeping and "washing up" downstairs.  Husband unable to safely provide assist to patient.    Co-evaluation               End of Session Equipment Utilized During Treatment: Gait belt;Oxygen Activity Tolerance: Patient limited by fatigue Patient left: in bed;with call bell/phone within reach;with family/visitor present Nurse Communication: Mobility status         Time: [id number] PT Time Calculation (min): 25 min   Charges:   PT Evaluation $Initial PT Evaluation Tier I: 1 Procedure PT Treatments $Therapeutic Activity: 8-22 mins   PT G Codes:          Vena AustriaSusan H Naylin Burkle 05/01/2014, 5:04 PM Durenda HurtSusan H. Renaldo Fiddleravis, PT, [facility] Acute [facility] Pager [phone]

## 2014-05-01 NOTE — Progress Notes (Signed)
[facility] PROGRESS NOTE  Lori [name] ASN:[id number] DOB: [birth year] DOA: 04/28/2014 PCP: Jackie Plum, MD  Assessment/Plan: 1. Atypical chest pain with mildly elevated troponin -likely demand, chest pain free now, repeat enzymes negative -continue ASA, Plan for outpatient myoview -ECHo with normal wall motion, preserved EF and Grade 1 DD -normal cath in 2001  2. Advanced Pulm fibrosis -on 4-5L home O2, foll by dr.Wert  3. Community acquired pneumonia -based on Ct chest, although didn't have many symptoms to suggest this -s/p day 3 of rocephin/zithromax, change to Po levaquin -FU sputum Cx  4. Acute on chronic diastolic CHF -Improved, continue PO lasix -FU I/Os, weights -4.2L negative -ECHo with grade 1DD and preserved EF  5. Anxiety -xanax PRN  DVT proph" on heparin  Ambulate, Pt/OT  Code Status: Full Code Family Communication:d/w daughter at bedside Disposition Plan: SNF tomorrow if bed available   Consultants:  Cards  HPI/Subjective: Feels better, breathing improving, no further chest pain  Objective: Filed Vitals:   05/01/14 0529  BP: 106/53  Pulse: 72  Temp: 97.8 F (36.6 C)  Resp: 24    Intake/Output Summary (Last 24 hours) at 05/01/14 0911 Last data filed at 04/30/14 2343  Gross per 24 hour  Intake    940 ml  Output   1550 ml  Net   -610 ml   Filed Weights   04/30/14 1315 05/01/14 0529 05/01/14 0530  Weight: 64.4 kg (141 lb 15.6 oz) 61.6 kg (135 lb 12.9 oz) 63.2 kg (139 lb 5.3 oz)    Exam:   General:  AAOx3  Cardiovascular:S1S2/RRR, diastolic murmur  Respiratory: fine crackles throughout  Abdomen: soft, Nt, BS [present  Musculoskeletal:trace edema no c/c   Data Reviewed: Basic Metabolic Panel:  Recent Labs Lab 04/28/14 1523 04/29/14 0340 05/01/14 0339  NA 138 139 142  K 4.6 4.0 4.5  CL 99 99 100  CO2 28 29 33*  GLUCOSE 88 86 93  BUN 18 15 14   CREATININE 0.83 0.88 1.05  CALCIUM 9.2 8.8 9.4   Liver  Function Tests: No results found for this basename: AST, ALT, ALKPHOS, BILITOT, PROT, ALBUMIN,  in the last 168 hours No results found for this basename: LIPASE, AMYLASE,  in the last 168 hours No results found for this basename: AMMONIA,  in the last 168 hours CBC:  Recent Labs Lab 04/28/14 1523 04/29/14 0340 04/30/14 0250 05/01/14 0339  WBC 12.0* 12.0* 12.2* 11.6*  HGB 13.0 12.3 12.6 13.2  HCT 39.9 37.8 39.1 40.9  MCV 91.1 90.6 91.1 91.5  PLT 289 264 285 278   Cardiac Enzymes:  Recent Labs Lab 04/28/14 2215 04/29/14 0340 04/29/14 1601 04/29/14 2138  TROPONINI <0.30 0.36* <0.30 <0.30   BNP (last 3 results)  Recent Labs  07/04/13 1247 04/28/14 1523  PROBNP 119.0* 3118.0*   CBG: No results found for this basename: GLUCAP,  in the last 168 hours  Recent Results (from the past 240 hour(s))  CULTURE, BLOOD (ROUTINE X 2)     Status: None   Collection Time    04/28/14 10:15 PM      Result Value Ref Range Status   Specimen Description BLOOD RIGHT ANTECUBITAL   Final   Special Requests BOTTLES DRAWN AEROBIC AND ANAEROBIC 4CC EACH   Final   Culture  Setup Time     Final   Value: 04/29/2014 03:26     Performed at [facility]   Culture     Final   Value:  BLOOD CULTURE RECEIVED NO GROWTH TO DATE CULTURE WILL BE HELD FOR 5 DAYS BEFORE ISSUING A FINAL NEGATIVE REPORT     [facility]   Report Status PENDING   Incomplete  CULTURE, BLOOD (ROUTINE X 2)     Status: None   Collection Time    04/28/14 10:21 PM      Result Value Ref Range Status   Specimen Description BLOOD RIGHT HAND   Final   Special Requests BOTTLES DRAWN AEROBIC AND ANAEROBIC 5CC EACH   Final   Culture  Setup Time     Final   Value: 04/29/2014 03:26     [facility]   Culture     Final   Value:        BLOOD CULTURE RECEIVED NO GROWTH TO DATE CULTURE WILL BE HELD FOR 5 DAYS BEFORE ISSUING A FINAL NEGATIVE REPORT     [facility]   Report Status PENDING   Incomplete  MRSA PCR SCREENING     Status: None   Collection Time    04/28/14 10:36 PM      Result Value Ref Range Status   MRSA by PCR NEGATIVE  NEGATIVE Final   Comment:            The GeneXpert MRSA Assay (FDA     approved for NASAL specimens     only), is one component of a     comprehensive MRSA colonization     surveillance program. It is not     intended to diagnose MRSA     infection nor to guide or     monitor treatment for     MRSA infections.     Studies: No results found.  Scheduled Meds: . aspirin  325 mg Oral Daily  . azithromycin  500 mg Oral Daily  . cefTRIAXone (ROCEPHIN)  IV  1 g Intravenous Q24H  . enoxaparin (LOVENOX) injection  40 mg Subcutaneous Q24H  . famotidine  20 mg Oral Daily  . furosemide  40 mg Oral BID  . guaiFENesin  600 mg Oral BID  . irbesartan  75 mg Oral Daily  . metoprolol  50 mg Oral BID  . pantoprazole  20 mg Oral Daily  . sodium chloride  3 mL Intravenous Q12H   Continuous Infusions:   Antibiotics Given (last 72 hours)   Date/Time Action Medication Dose Rate   04/29/14 2234 Given   azithromycin (ZITHROMAX) 500 mg in dextrose 5 % 250 mL IVPB 500 mg 250 mL/hr   04/29/14 2235 Given   cefTRIAXone (ROCEPHIN) 1 g in dextrose 5 % 50 mL IVPB 1 g 100 mL/hr   04/30/14 1600 Given   azithromycin (ZITHROMAX) tablet 500 mg 500 mg    04/30/14 2332 Given   cefTRIAXone (ROCEPHIN) 1 g in dextrose 5 % 50 mL IVPB 1 g 100 mL/hr      Principal Problem:   Acute on chronic diastolic congestive heart failure, NYHA class 4 Active Problems:   Chronic respiratory failure   Pulmonary fibrosis   Heart failure   Unstable angina   Cor pulmonale, chronic   Right heart failure due to pulmonary hypertension    Time spent: 35min    Zannie CovePreetha Alonso Gapinski  [facility] Pager [phone]. If 7PM-7AM, please contact night-coverage at [email or web address], password [email or web address] 05/01/2014, 9:11 AM  LOS: 3 days

## 2014-05-01 NOTE — Progress Notes (Signed)
The patient complained of muscular type pain in her left arm.  She stated it is sore when she moves it.  She was given a heat pack and stated that it helped.  She was also given Oxy IR x1 and Xanax x1 overnight.  Her VS were stable overnight.

## 2014-05-01 NOTE — Progress Notes (Signed)
Pt stated "its a chore to eat because of my breathing" PRN xanax given as ordered, pt stable

## 2014-05-02 LAB — CBC
HEMATOCRIT: 41.3 % (ref 36.0–46.0)
Hemoglobin: 13.5 g/dL (ref 12.0–15.0)
MCH: 30 pg (ref 26.0–34.0)
MCHC: 32.7 g/dL (ref 30.0–36.0)
MCV: 91.8 fL (ref 78.0–100.0)
Platelets: 274 10*3/uL (ref 150–400)
RBC: 4.5 MIL/uL (ref 3.87–5.11)
RDW: 13.9 % (ref 11.5–15.5)
WBC: 10.8 10*3/uL — ABNORMAL HIGH (ref 4.0–10.5)

## 2014-05-02 LAB — LEGIONELLA ANTIGEN, URINE: LEGIONELLA ANTIGEN, URINE: NEGATIVE

## 2014-05-02 MED ORDER — LEVOFLOXACIN 750 MG PO TABS: 750.0000 mg | ORAL_TABLET | ORAL | Status: DC

## 2014-05-02 NOTE — Discharge Summary (Signed)
Physician Discharge Summary  Lori [name] NOB:[id number] DOB: [birth year] DOA: 04/28/2014  PCP: Lori Plum, MD  Admit date: 04/28/2014 Discharge date: 05/02/2014  Time spent: 45 minutes  Recommendations for Outpatient Follow-up:  1. Lori [name] 7/2, Lori [name]'s PA 2. Myoview outpatient pending cards FU  Discharge Diagnoses:  Principal Problem:   Acute on chronic diastolic congestive heart failure, NYHA class 4   Community acquired pneumonia   RBBB   DIASTOLIC HEART FAILURE, CHRONIC   Noncompliance- ran out of Lasix   Chronic respiratory failure on 4-5L Home O2   Pulmonary fibrosis   Cor pulmonale, chronic   Right heart failure due to pulmonary hypertension   Elevated troponin- POC and 1/4 Troponin elevated (0.36)   Normal coronary arteries 2001 with coronary Ca++ on CT    Discharge Condition:stable  Diet recommendation: low sodium  Filed Weights   05/01/14 0529 05/01/14 0530 05/02/14 0541  Weight: 61.6 kg (135 lb 12.9 oz) 63.2 kg (139 lb 5.3 oz) 61.1 kg (134 lb 11.2 oz)    History of present illness:  Lori [name] is a 79 y.o. female, history of Pulm fibrosis on 4-5L Home O2, hypertension diastolic congestive heart failure presented to ER with 1-2 months history of shortness of breath worsening and pressure like chest pain episodes on and off. For financial reasons the patient did not refill her Lasix, and she went to her cardiologist today who will give her the Lasix prescription and did an EKG and ask her to go to the emergency room after examination to be evaluated. Her EKG was abnormal with worsening T-wave inversions when compared to prior EKGs  Hospital Course:  1. Atypical chest pain with mildly elevated troponin -resolved -likely demand, chest pain free now, repeat enzymes negative  -continue ASA, Plan for outpatient myoview per Cardiology -[facility] with normal wall motion, preserved EF and Grade 1 DD  -normal cath in 2001   2. Advanced Pulm fibrosis  -on  4-5L home O2, foll by Lori [name]   3. Community acquired pneumonia  -based on Ct chest, although didn't have many symptoms to suggest this  -s/p day 3 of rocephin/zithromax, changed to Po levaquin  -Sputum Cx were not sent as ordered  4. Acute on chronic diastolic CHF  -Improved, diuresed with IV lasix -4.2L negative  -now on PO lasix with KCL -[facility] with grade 1DD and preserved EF   5. Anxiety  -xanax PRN   Consultations:  Cardiology  Discharge Exam: Filed Vitals:   05/02/14 1114  BP: 103/54  Pulse: 86  Temp:   Resp:     General: AAOx3, no distress Cardiovascular: S1S2/RRR Respiratory: fine crackles throughout  Discharge Instructions You were cared for by a hospitalist during your hospital stay. If you have any questions about your discharge medications or the care you received while you were in the hospital after you are discharged, you can call the unit and asked to speak with the hospitalist on call if the hospitalist that took care of you is not available. Once you are discharged, your primary care physician will handle any further medical issues. Please note that NO REFILLS for any discharge medications will be authorized once you are discharged, as it is imperative that you return to your primary care physician (or establish a relationship with a primary care physician if you do not have one) for your aftercare needs so that they can reassess your need for medications and monitor your lab values.  Discharge Instructions   Diet - low sodium heart healthy  Complete by:  As directed      Increase activity slowly    Complete by:  As directed             Medication List         ALPRAZolam 0.25 MG tablet  Commonly known as:  XANAX  Take 1 tablet (0.25 mg total) by mouth every 8 (eight) hours as needed for anxiety.     ascorbic acid 500 MG tablet  Commonly known as:  VITAMIN C  Take 500 mg by mouth daily.     aspirin 81 MG tablet  Take 325 mg by mouth daily.      famotidine 20 MG tablet  Commonly known as:  PEPCID  Take 20 mg by mouth daily.     furosemide 40 MG tablet  Commonly known as:  LASIX  TAKE 1 TABLET (40 MG TOTAL) BY MOUTH DAILY.     levofloxacin 750 MG tablet  Commonly known as:  LEVAQUIN  Take 1 tablet (750 mg total) by mouth every other day. For 3days     metoprolol 50 MG tablet  Commonly known as:  LOPRESSOR  Take 1 tablet (50 mg total) by mouth 2 (two) times daily.     nitroGLYCERIN 0.4 MG SL tablet  Commonly known as:  NITROSTAT  Place 1 tablet (0.4 mg total) under the tongue every 5 (five) minutes as needed. For chest pain     pantoprazole 40 MG tablet  Commonly known as:  PROTONIX  take 1 tablet by mouth daily before BREAKFAST     potassium chloride SA 20 MEQ tablet  Commonly known as:  K-DUR,KLOR-CON  TAKE 1 TABLET (20 MEQ TOTAL) BY MOUTH DAILY.     valsartan 160 MG tablet  Commonly known as:  DIOVAN  Take 160 mg by mouth daily.     Vitamin D-3 1000 UNITS Caps  Take 1 capsule by mouth daily.       No Known Allergies     Follow-up Information   Follow up with Lori Newcomer, PA-C On 05/25/2014. (3:30 pm (Lori Lindaann Slough PA))    Specialty:  Physician Assistant   Contact information:   [address] [place] [address] [phone]        The results of significant diagnostics from this hospitalization (including imaging, microbiology, ancillary and laboratory) are listed below for reference.    Significant Diagnostic Studies: Dg Chest 2 View  05/01/2014   CLINICAL DATA:  Shortness of breath.  EXAM: CHEST  2 VIEW  COMPARISON:  04/28/2014  FINDINGS: The heart is enlarged. There are patchy parenchymal infiltrates bilaterally consistent with pulmonary fibrosis and unchanged.  IMPRESSION: No significant change in the appearance of pulmonary opacities.   Electronically Signed   By: Lori [name] M.D.   On: 05/01/2014 13:53   Ct Angio Chest Pe W/cm &/or Wo Cm  04/28/2014   CLINICAL DATA:  Chest  pressure and shortness of Breath  EXAM: CT ANGIOGRAPHY CHEST WITH CONTRAST  TECHNIQUE: Multidetector CT imaging of the chest was performed using the standard protocol during bolus administration of intravenous contrast. Multiplanar CT image reconstructions and MIPs were obtained to evaluate the vascular anatomy.  CONTRAST:  OMNIPAQUE IOHEXOL 350 MG/ML SOLN  COMPARISON:  Chest radiograph April 28, 2014 and chest CT angiogram August 18, 2012  FINDINGS: There is no demonstrable pulmonary embolus. There is no thoracic aortic aneurysm or dissection. The right subclavian artery has anaberrant course, passing posterior to the esophagus.  There is underlying emphysematous change. There is extensive interstitial fibrosis bilaterally with multiple areas of patchy consolidation throughout both lungs, a finding that was also present previously. In comparison with the previous study from 2013, there is new opacity in the right upper lobe anterior segment.  There are remains generalized soft tissue thickening in the mediastinum to the left of the trachea extending from the upper thoracic trachea on the left to the level of the carinal, stable. There are multiple lymph nodes in this area, stable. No new lymph adenopathy is appreciable on this study.  Heart is mildly enlarged. There is slight pericardial thickening which is stable.  Visualized upper abdominal structures appear unremarkable. There are no blastic or lytic bone lesions. There is degenerative change in the thoracic spine. Thyroid appears unremarkable.  Review of the MIP images confirms the above findings.  IMPRESSION: No demonstrable pulmonary embolus.  Extensive lung fibrosis with underlying emphysema. In comparison with the prior study, there is new airspace consolidation in portions of the right upper lobe, primarily in the anterior segment. These areas have an appearance more suspicious for pneumonia than mass, although a mass could easily be obscured by the  degree of consolidation and fibrosis in this area. In this regard, would advise followup study in 3-4 weeks to further assess. If this area persists without change at that time, it would be entirely reasonable and advisable to consider PET-CT with particular attention to the right upper lobe region.  Thoracic adenopathy is stable. No new soft tissue thickening or lymph node prominence is seen in the mediastinum.  Heart is enlarged with mild pericardial thickening, stable.   Electronically Signed   By: Bretta Bang M.D.   On: 04/28/2014 17:34   Dg Chest Port 1 View  04/28/2014   CLINICAL DATA:  Shortness of breath.  EXAM: PORTABLE CHEST - 1 VIEW  COMPARISON:  Chest x-ray 09/21/2013.  FINDINGS: Again noted is a background of diffuse interstitial prominence. However, compared to most recent prior examinations, there is worsening confluent opacity throughout the left mid to lower lung. Persistent somewhat nodular opacity in the right upper lobe, which is increasingly conspicuous over the past several examinations. No definite pleural effusions. Findings are not favored to reflect edema. Heart size does appear mildly enlarged. Upper mediastinal contours are distorted by patient's rotation to the left.  IMPRESSION: 1. Although there is clearly a background of underlying interstitial lung disease, today's study demonstrates new airspace consolidation throughout the left mid to lower lung, obscuring left heart border, likely within both the lingula and left lower lobe, concerning for multilobar pneumonia. 2. Increasing conspicuity of nodular opacity in the periphery of the right upper lobe over prior examinations, concerning for potential neoplasm. 3. For further evaluation of all these findings, a followup high-resolution chest CT is suggested in the near future.   Electronically Signed   By: Trudie Reed M.D.   On: 04/28/2014 15:27    Microbiology: Recent Results (from the past 240 hour(s))  CULTURE, BLOOD  (ROUTINE X 2)     Status: None   Collection Time    04/28/14 10:15 PM      Result Value Ref Range Status   Specimen Description BLOOD RIGHT ANTECUBITAL   Final   Special Requests BOTTLES DRAWN AEROBIC AND ANAEROBIC [facility]   Final   Culture  Setup Time     Final   Value: 04/29/2014 03:26     Performed at [facility]  Final   Value:        BLOOD CULTURE RECEIVED NO GROWTH TO DATE CULTURE WILL BE HELD FOR 5 DAYS BEFORE ISSUING A FINAL NEGATIVE REPORT     Performed at [facility]   Report Status PENDING   Incomplete  CULTURE, BLOOD (ROUTINE X 2)     Status: None   Collection Time    04/28/14 10:21 PM      Result Value Ref Range Status   Specimen Description BLOOD RIGHT HAND   Final   Special Requests BOTTLES DRAWN AEROBIC AND ANAEROBIC 5CC EACH   Final   Culture  Setup Time     Final   Value: 04/29/2014 03:26     Performed at [facility]   Culture     Final   Value:        BLOOD CULTURE RECEIVED NO GROWTH TO DATE CULTURE WILL BE HELD FOR 5 DAYS BEFORE ISSUING A FINAL NEGATIVE REPORT     Performed at [facility]   Report Status PENDING   Incomplete  MRSA PCR SCREENING     Status: None   Collection Time    04/28/14 10:36 PM      Result Value Ref Range Status   MRSA by PCR NEGATIVE  NEGATIVE Final   Comment:            The GeneXpert MRSA Assay (FDA     approved for NASAL specimens     only), is one component of a     comprehensive MRSA colonization     surveillance program. It is not     intended to diagnose MRSA     infection nor to guide or     monitor treatment for     MRSA infections.     Labs: Basic Metabolic Panel:  Recent Labs Lab 04/28/14 1523 04/29/14 0340 05/01/14 0339  NA 138 139 142  K 4.6 4.0 4.5  CL 99 99 100  CO2 28 29 33*  GLUCOSE 88 86 93  BUN 18 15 14   CREATININE 0.83 0.88 1.05  CALCIUM 9.2 8.8 9.4   Liver Function Tests: No results found for this basename: AST, ALT, ALKPHOS, BILITOT, PROT,  ALBUMIN,  in the last 168 hours No results found for this basename: LIPASE, AMYLASE,  in the last 168 hours No results found for this basename: AMMONIA,  in the last 168 hours CBC:  Recent Labs Lab 04/28/14 1523 04/29/14 0340 04/30/14 0250 05/01/14 0339 05/02/14 0415  WBC 12.0* 12.0* 12.2* 11.6* 10.8*  HGB 13.0 12.3 12.6 13.2 13.5  HCT 39.9 37.8 39.1 40.9 41.3  MCV 91.1 90.6 91.1 91.5 91.8  PLT 289 264 285 278 274   Cardiac Enzymes:  Recent Labs Lab 04/28/14 2215 04/29/14 0340 04/29/14 1601 04/29/14 2138  TROPONINI <0.30 0.36* <0.30 <0.30   BNP: BNP (last 3 results)  Recent Labs  07/04/13 1247 04/28/14 1523  PROBNP 119.0* 3118.0*   CBG: No results found for this basename: GLUCAP,  in the last 168 hours     Signed:  Zannie CovePreetha Kyrel Leighton  [facility] 05/02/2014, 2:09 PM

## 2014-05-02 NOTE — Progress Notes (Signed)
Patient evaluated for community based chronic disease management services with [facility] Care Management Program as a benefit of patient's [employer]. Spoke with patient, spouse Kashish Auld who has dementia), and daughter in law Lamiya Benedek [phone] who is the primary contact) at bedside to explain [facility] Care Management services.  Services have been accepted with written consent.  The family would like to transition the couple to assisted living following the wife's short term rehabilitation stay.  Patient's daughter in law will receive a post discharge transition of care call and will be evaluated for monthly home visits for assessments and disease process education.  Left contact information and [facility] literature at bedside. Made Inpatient Case Manager aware that [facility] Care Management following. Of note, [facility] Care Management services does not replace or interfere with any services that are arranged by inpatient case management or social work.  For additional questions or referrals please contact Anibal Henderson BSN RN [facility] Liaison at [phone].

## 2014-05-02 NOTE — Evaluation (Signed)
Occupational Therapy Evaluation Patient Details Name: Chauncy Dondlinger MRN: [id number] DOB: 07-03-1935 Today's Date: 05/02/2014    History of Present Illness Patient is a 79 yo female admitted 04/28/14 with increasing SOB, chest pain.  Patient with CHF.  PMH:  HTN, CHF, depression, anxiety, chronic lung disease, on home O2.   Clinical Impression   Pt demonstrates decline in function and safety with ADLs and ADL mobility with decreased strength, balance and endurance. Pt would benefit from acute OT services to address impairments to increase level of function and safety. Pt/family considering ALF placement for pt and her husband after short term rehab at SNF    Follow Up Recommendations  SNF;Supervision/Assistance - 24 hour    Equipment Recommendations   TBD at next venue of care   Recommendations for Other Services       Precautions / Restrictions Precautions Precautions: Fall Restrictions Weight Bearing Restrictions: No      Mobility Bed Mobility Overal bed mobility: Needs Assistance Bed Mobility: Supine to Sit;Sit to Supine     Supine to sit: Supervision;HOB elevated Sit to supine: Supervision;HOB elevated      Transfers Overall transfer level: Needs assistance Equipment used: Rolling walker (2 wheeled) Transfers: Sit to/from Stand Sit to Stand: Min assist         General transfer comment: Verbal cues for hand placement.  Assist for balance/safety.    Balance Overall balance assessment: Needs assistance Sitting-balance support: No upper extremity supported;Feet supported Sitting balance-Leahy Scale: Good     Standing balance support: Single extremity supported;Bilateral upper extremity supported;During functional activity Standing balance-Leahy Scale: Fair                              ADL Overall ADL's : Needs assistance/impaired     Grooming: Wash/dry hands;Wash/dry face;Standing;Min guard   Upper Body Bathing: Supervision/ safety;Set  up;Sitting   Lower Body Bathing: Moderate assistance;Sit to/from stand   Upper Body Dressing : Supervision/safety;Set up;Sitting   Lower Body Dressing: Moderate assistance;Sit to/from stand   Toilet Transfer: Minimal assistance;Regular Toilet;Grab bars;RW Statistician Details (indicate cue type and reason): Verbal cues for hand placement.  Assist for balance/safety. Toileting- Architect and Hygiene: Min guard;Sit to/from stand;Minimal assistance   Tub/ Shower Transfer: Minimal assistance;Grab bars   Functional mobility during ADLs: Minimal assistance General ADL Comments: Pt and family educated on DME/adaptive equrioment for home use for safety and energy conservation. Pt's daughter in law had many questions/concerns about SNF rehab and ALF placement, discussed with her and pt at length     Vision  wears glasses at all times                   Perception Perception Perception Tested?: No   Praxis Praxis Praxis tested?: Not tested    Pertinent Vitals/Pain No c/o pain, VSS     Hand Dominance Right   Extremity/Trunk Assessment Upper Extremity Assessment Upper Extremity Assessment: Generalized weakness   Lower Extremity Assessment Lower Extremity Assessment: Defer to PT evaluation   Cervical / Trunk Assessment Cervical / Trunk Assessment: Normal   Communication Communication Communication: HOH   Cognition Arousal/Alertness: Awake/alert Behavior During Therapy: WFL for tasks assessed/performed Overall Cognitive Status: Within Functional Limits for tasks assessed                     General Comments   Pt very pleasant and cooperative, family very supportive  Home Living Family/patient expects to be discharged to:: Skilled nursing facility Living Arrangements: Spouse/significant other   Type of Home: House       Home Layout: Two level;Bed/bath upstairs Alternate Level Stairs-Number of Steps: flight Alternate  Level Stairs-Rails: Right Bathroom Shower/Tub: Chief Strategy OfficerTub/shower unit   Bathroom Toilet: Standard     Home Equipment: Cane - single point;Shower seat          Prior Functioning/Environment Level of Independence: Independent with assistive device(s);Needs assistance  Gait / Transfers Assistance Needed: Ambulates independently inside house.  Uses cane outside of house. ADL's / Homemaking Assistance Needed: Needs assist with housekeeping        OT Diagnosis: Generalized weakness   OT Problem List: Decreased strength;Decreased knowledge of use of DME or AE;Decreased activity tolerance;Impaired balance (sitting and/or standing);Decreased safety awareness;Decreased knowledge of precautions;Cardiopulmonary status limiting activity   OT Treatment/Interventions: Self-care/ADL training;Therapeutic exercise;Patient/family education;Neuromuscular education;Balance training;Therapeutic activities;DME and/or AE instruction;Energy conservation    OT Goals(Current goals can be found in the care plan section) ADL Goals Pt Will Perform Grooming: with set-up;with supervision;standing Pt Will Perform Lower Body Bathing: with min assist;with min guard assist;sit to/from stand Pt Will Perform Lower Body Dressing: with min assist;with min guard assist;sit to/from stand Pt Will Transfer to Toilet: with min guard assist;with supervision;grab bars;ambulating Pt Will Perform Toileting - Clothing Manipulation and hygiene: with supervision;sit to/from stand  OT Frequency: Min 2X/week   Barriers to D/C: Decreased caregiver support                        End of Session Equipment Utilized During Treatment: Rolling walker  Activity Tolerance: Patient tolerated treatment well Patient left: in bed;with call bell/phone within reach;with family/visitor present   Time: 1019-1101 OT Time Calculation (min): 42 min Charges:  OT General Charges $OT Visit: 1 Procedure OT Evaluation $Initial OT Evaluation Tier  I: 1 Procedure OT Treatments $Self Care/Home Management : 8-22 mins $Therapeutic Activity: 8-22 mins G-Codes:    Lafe GarinDenise J Gelene Recktenwald 05/02/2014, 1:25 PM

## 2014-05-02 NOTE — Progress Notes (Signed)
The patient stated that she felt "much better" during the night shift.  She was ambulated around the room x2 overnight and stated that she was much stronger than when she ambulated during the day due to being able to breathe better.  She still has some dyspnea on exertion and crackles can be heard in her lung bases.

## 2014-05-02 NOTE — Progress Notes (Signed)
Patient discussed with Dr. Jomarie Longs. Patient is medically stable for d/c. Bed offers given and patient/family has accepted a bed at [facility]. [employer] Berkley Harvey has been requested and will plan d/c to SNF tomorrow once Berkley Harvey is obtained.  Patient, husband and daughter-in-law Efraim Kaufmann are agreeable to this plan and are very happy with this plan.  Patient and her husband had wanted to completed their HCPOA and the documents were provided to them for review. Per patient's daughter-in-law Efraim Kaufmann- the family plans to seek a medical and health care POA document for both patient and husband via the family attorney. Thus- they do not wish to complete hospital's document.  Will coordinate d/c tomorrow with Donne Hazel, Admissions at [facility].  Patient's nurse notified of above.  Lorri Frederick. [place]  [phone]

## 2014-05-02 NOTE — Clinical Social Work Placement (Addendum)
Clinical Social Work Department CLINICAL SOCIAL WORK PLACEMENT NOTE 05/04/2014  Patient:  Lori [name]  Account Number:  [id number] Admit date:  04/28/2014  Clinical Social Worker:  Butch Penny Alivea Gladson, LCSWA  Date/time:  05/01/2014 06:30 PM  Clinical Social Work is seeking post-discharge placement for this patient at the following level of care:   SKILLED NURSING   (*CSW will update this form in Epic as items are completed)   05/02/2014  Patient/family provided with [place] Department of Clinical Social Work's list of facilities offering this level of care within the geographic area requested by the patient (or if unable, by the patient's family).  05/02/2014  Patient/family informed of their freedom to choose among providers that offer the needed level of care, that participate in Medicare, Medicaid or managed care program needed by the patient, have an available bed and are willing to accept the patient.  05/02/2014  Patient/family informed of [facility]' ownership interest in [name], as well as of the fact that they are under no obligation to receive care at this facility.  PASARR submitted to EDS on 05/02/2014 PASARR number received on 05/02/2014  FL2 transmitted to all facilities in geographic area requested by pt/family on  05/02/2014 FL2 transmitted to all facilities within larger geographic area on   Patient informed that his/her managed care company has contracts with or will negotiate with  certain facilities, including the following:   [employer]  (not [employer])     Patient/family informed of bed offers received:  05/02/2014 Patient chooses bed at [name] Physician recommends and patient chooses bed at    Patient to be transferred to [name] on  05/04/2014 Patient to be transferred to facility by [name]) Patient and family notified of transfer on 05/04/2014 Name of family member notified:  Lori [name]- Daughter-in-law and pts  husband  The following physician request were entered in Epic:   Additional Comments: 05/04/14  [place] per MD for d/c today to short term SNF [name].  Family has arranged for [name] to provide supportive care for patient's husband whom they state has early signs of dementia. Patient remains alert and oriented; she is fully agreeable to SNF placmement and states that she is feeling  more rested and happy that she no longer has heavy caregiver responsibilities on her shoulders.  Patient requested to complete an Advanced Directive and HCPOA.  Her husband also filled out one but was not at the [name] when [facility] staff met with patient. CSW spoke to daughter-in-law- Lori [name] who stated that [name] has decided to wait on completing his as the family is meeting with estate attorneys to talk about financial issues and Medicaid. This was confirmed by Patient. Nursing notifiied to call report; no further CSW needs identified. CSW signing off.

## 2014-05-03 ENCOUNTER — Ambulatory Visit: Payer: Medicare Other | Admitting: Internal Medicine

## 2014-05-03 DIAGNOSIS — R197 Diarrhea, unspecified: Secondary | ICD-10-CM

## 2014-05-03 LAB — CBC
HCT: 41.9 % (ref 36.0–46.0)
Hemoglobin: 13.5 g/dL (ref 12.0–15.0)
MCH: 29.8 pg (ref 26.0–34.0)
MCHC: 32.2 g/dL (ref 30.0–36.0)
MCV: 92.5 fL (ref 78.0–100.0)
Platelets: 248 10*3/uL (ref 150–400)
RBC: 4.53 MIL/uL (ref 3.87–5.11)
RDW: 14.1 % (ref 11.5–15.5)
WBC: 11 10*3/uL — AB (ref 4.0–10.5)

## 2014-05-03 LAB — CLOSTRIDIUM DIFFICILE BY PCR: Toxigenic C. Difficile by PCR: NEGATIVE

## 2014-05-03 MED ORDER — IRBESARTAN 75 MG PO TABS
75.0000 mg | ORAL_TABLET | Freq: Every day | ORAL | Status: DC
  Administered 2014-05-04: 75 mg via ORAL
  Filled 2014-05-03: qty 1

## 2014-05-03 MED ORDER — DIPHENHYDRAMINE HCL 12.5 MG/5ML PO ELIX: 12.5000 mg | ORAL_SOLUTION | Freq: Every evening | ORAL | Status: DC | PRN

## 2014-05-03 MED ORDER — DIPHENOXYLATE-ATROPINE 2.5-0.025 MG PO TABS
1.0000 | ORAL_TABLET | Freq: Four times a day (QID) | ORAL | Status: DC | PRN
  Administered 2014-05-03: 1 via ORAL
  Filled 2014-05-03: qty 1

## 2014-05-03 MED ORDER — ZOLPIDEM TARTRATE 5 MG PO TABS
5.0000 mg | ORAL_TABLET | Freq: Every evening | ORAL | Status: DC | PRN
  Administered 2014-05-03: 5 mg via ORAL
  Filled 2014-05-03: qty 1

## 2014-05-03 NOTE — Progress Notes (Signed)
[facility] PROGRESS NOTE  Lori [name] ZOX:[id number] DOB: 08-Mar-1935 DOA: 04/28/2014 PCP: Jackie Plum, MD  Assessment/Plan: 1. Atypical chest pain with mildly elevated troponin -likely demand, chest pain free now, repeat enzymes negative -continue ASA,  -ECHo with normal wall motion, preserved EF and Grade 1 DD -normal cath in 2001 -Per cardiology to follow up outpatient for myoview 2. Advanced Pulm fibrosis -on 4-5L home O2, foll by dr.Wert  3. Community acquired pneumonia -based on Ct chest, although didn't have many symptoms to suggest this -s/p day 3 of rocephin/zithromax, was changed to Po levaquin -Continue Levaquin   4. Acute on chronic diastolic CHF -Improved, continue PO lasix -FU I/Os, weights -4.2L negative -ECHo with grade 1DD and preserved EF  5. Anxiety - continuexanax PRN  6. Diarrhea -Will obtain stool to evaluate for C. difficile, she has been on antibiotics as above.  DVT proph" on heparin  Ambulate, Pt/OT  Code Status: Full Code Family Communication:d/w daughter at bedside Disposition Plan: SNF  once diarrhea resolves    Consultants:  Cards  HPI/Subjective:  denies chest pain and shortness of breath. Complaining of diarrhea-4-5 since early this morning  Objective: Filed Vitals:   05/03/14 1055  BP: 126/50  Pulse:   Temp:   Resp:     Intake/Output Summary (Last 24 hours) at 05/03/14 1100 Last data filed at 05/02/14 1810  Gross per 24 hour  Intake    720 ml  Output      3 ml  Net    717 ml   Filed Weights   05/01/14 0530 05/02/14 0541 05/03/14 0635  Weight: 63.2 kg (139 lb 5.3 oz) 61.1 kg (134 lb 11.2 oz) 61.3 kg (135 lb 2.3 oz)    Exam:   General:  AAOx3  Cardiovascular:S1S2/RRR, diastolic murmur  Respiratory: fine crackles throughout, no wheezes   Abdomen: soft, Nt, BS [present  Musculoskeletal: no edema no c/c   Data Reviewed: Basic Metabolic Panel:  Recent Labs Lab 04/28/14 1523 04/29/14 0340  05/01/14 0339  NA 138 139 142  K 4.6 4.0 4.5  CL 99 99 100  CO2 28 29 33*  GLUCOSE 88 86 93  BUN 18 15 14   CREATININE 0.83 0.88 1.05  CALCIUM 9.2 8.8 9.4   Liver Function Tests: No results found for this basename: AST, ALT, ALKPHOS, BILITOT, PROT, ALBUMIN,  in the last 168 hours No results found for this basename: LIPASE, AMYLASE,  in the last 168 hours No results found for this basename: AMMONIA,  in the last 168 hours CBC:  Recent Labs Lab 04/29/14 0340 04/30/14 0250 05/01/14 0339 05/02/14 0415 05/03/14 0511  WBC 12.0* 12.2* 11.6* 10.8* 11.0*  HGB 12.3 12.6 13.2 13.5 13.5  HCT 37.8 39.1 40.9 41.3 41.9  MCV 90.6 91.1 91.5 91.8 92.5  PLT 264 285 278 274 248   Cardiac Enzymes:  Recent Labs Lab 04/28/14 2215 04/29/14 0340 04/29/14 1601 04/29/14 2138  TROPONINI <0.30 0.36* <0.30 <0.30   BNP (last 3 results)  Recent Labs  07/04/13 1247 04/28/14 1523  PROBNP 119.0* 3118.0*   CBG: No results found for this basename: GLUCAP,  in the last 168 hours  Recent Results (from the past 240 hour(s))  CULTURE, BLOOD (ROUTINE X 2)     Status: None   Collection Time    04/28/14 10:15 PM      Result Value Ref Range Status   Specimen Description BLOOD RIGHT ANTECUBITAL   Final   Special Requests BOTTLES DRAWN AEROBIC AND ANAEROBIC 4CC  EACH   Final   Culture  Setup Time     Final   Value: 04/29/2014 03:26     Performed at [facility]   Culture     Final   Value:        BLOOD CULTURE RECEIVED NO GROWTH TO DATE CULTURE WILL BE HELD FOR 5 DAYS BEFORE ISSUING A FINAL NEGATIVE REPORT     Performed at [facility]   Report Status PENDING   Incomplete  CULTURE, BLOOD (ROUTINE X 2)     Status: None   Collection Time    04/28/14 10:21 PM      Result Value Ref Range Status   Specimen Description BLOOD RIGHT HAND   Final   Special Requests BOTTLES DRAWN AEROBIC AND ANAEROBIC 5CC EACH   Final   Culture  Setup Time     Final   Value: 04/29/2014 03:26      Performed at [facility]   Culture     Final   Value:        BLOOD CULTURE RECEIVED NO GROWTH TO DATE CULTURE WILL BE HELD FOR 5 DAYS BEFORE ISSUING A FINAL NEGATIVE REPORT     Performed at [facility]   Report Status PENDING   Incomplete  MRSA PCR SCREENING     Status: None   Collection Time    04/28/14 10:36 PM      Result Value Ref Range Status   MRSA by PCR NEGATIVE  NEGATIVE Final   Comment:            The GeneXpert MRSA Assay (FDA     approved for NASAL specimens     only), is one component of a     comprehensive MRSA colonization     surveillance program. It is not     intended to diagnose MRSA     infection nor to guide or     monitor treatment for     MRSA infections.     Studies: Dg Chest 2 View  05/01/2014   CLINICAL DATA:  Shortness of breath.  EXAM: CHEST  2 VIEW  COMPARISON:  04/28/2014  FINDINGS: The heart is enlarged. There are patchy parenchymal infiltrates bilaterally consistent with pulmonary fibrosis and unchanged.  IMPRESSION: No significant change in the appearance of pulmonary opacities.   Electronically Signed   By: Rosalie Gums M.D.   On: 05/01/2014 13:53    Scheduled Meds: . aspirin  81 mg Oral Daily  . enoxaparin (LOVENOX) injection  40 mg Subcutaneous Q24H  . furosemide  40 mg Oral Daily  . guaiFENesin  600 mg Oral BID  . [START ON 05/04/2014] irbesartan  75 mg Oral Daily  . levofloxacin  750 mg Oral Q48H  . metoprolol  50 mg Oral BID  . pantoprazole  20 mg Oral Daily  . senna-docusate  1 tablet Oral BID  . sodium chloride  3 mL Intravenous Q12H   Continuous Infusions:   Antibiotics Given (last 72 hours)   Date/Time Action Medication Dose Rate   04/30/14 1600 Given   azithromycin (ZITHROMAX) tablet 500 mg 500 mg    04/30/14 2332 Given   cefTRIAXone (ROCEPHIN) 1 g in dextrose 5 % 50 mL IVPB 1 g 100 mL/hr   05/01/14 1033 Given   levofloxacin (LEVAQUIN) tablet 500 mg 500 mg    05/02/14 2227 Given   levofloxacin (LEVAQUIN)  tablet 750 mg 750 mg       Principal Problem:  Acute on chronic diastolic congestive heart failure, NYHA class 4 Active Problems:   RBBB   DIASTOLIC HEART FAILURE, CHRONIC   Noncompliance- ran out of Lasix   Chronic respiratory failure   Pulmonary fibrosis   Cor pulmonale, chronic   Right heart failure due to pulmonary hypertension   Elevated troponin- POC and 1/4 Troponin elevated (0.36)   Normal coronary arteries 2001 with coronary Ca++ on CT   CAP (community acquired pneumonia)    Time spent: 35min    Coliseum Psychiatric HospitalVIYUOH,Raelynn Corron C  [facility] Pager [phone]. If 7PM-7AM, please contact night-coverage at [email or web address], password [email or web address] 05/03/2014, 11:00 AM  LOS: 5 days

## 2014-05-03 NOTE — Progress Notes (Signed)
Patient discussed with Dr. Gloris Manchester.  Patient is being assessed now for possible C-diff due to diarrhea overnight.  Bed offer remains in place for [facility]; awaiting [facility] authorization. Patient has a private room at [facility]- thus can be admitted with the C-diff if positive. (would need to have treatment initiated.)  CSW will monitor and assist with d/c when ok per MD.  Lupita Leash T. [place] Pugh  [phone]

## 2014-05-03 NOTE — Progress Notes (Signed)
Physical Therapy Treatment Patient Details Name: Lori [name] MRN: [id number] DOB: [birth year] Today's Date: 05/03/2014    History of Present Illness Patient is a 79 yo female admitted 04/28/14 with increasing SOB, chest pain.  Patient with CHF.  PMH:  HTN, CHF, depression, anxiety, chronic lung disease, on home O2.    PT Comments    Pt reports episode of diarrhea that began last night, which has kept her awake for most of the night. States she is fatigued from frequent transfers to/from [facility] and is declining OOB at time of therapy. Pt agreeable to bed exercise. VC's for pursed-lip breathing throughout session. Frequent breaks required as pt with DOE. Pt anticipates d/c to SNF today.   Follow Up Recommendations  SNF;Supervision/Assistance - 24 hour     Equipment Recommendations  Rolling walker with 5" wheels    Recommendations for Other Services       Precautions / Restrictions Precautions Precautions: Fall Restrictions Weight Bearing Restrictions: No    Mobility  Bed Mobility Overal bed mobility: Modified Independent             General bed mobility comments: Pt able to scoot to [facility] with use of bed rails for support and utilizing bridging technique. No other bed mobility was assessed.   Transfers                 General transfer comment: Pt declined OOB  Ambulation/Gait                 Stairs            Wheelchair Mobility    Modified Rankin (Stroke Patients Only)       Balance                                    Cognition Arousal/Alertness: Awake/alert Behavior During Therapy: WFL for tasks assessed/performed Overall Cognitive Status: Within Functional Limits for tasks assessed                      Exercises General Exercises - Lower Extremity Ankle Circles/Pumps: 20 reps Quad Sets: 15 reps Gluteal Sets: 10 reps Short Arc Quad: 10 reps Heel Slides: 10 reps Hip ABduction/ADduction: 10 reps Straight Leg  Raises: 10 reps    General Comments        Pertinent Vitals/Pain Pt on 4.5L/min supplemental O2 throughout session. Sats decreased to 84% with therapeutic exercise. Able to be improved with pursed-lip breathing.     Home Living                      Prior Function            PT Goals (current goals can now be found in the care plan section) Acute Rehab PT Goals Patient Stated Goal: To get stronger PT Goal Formulation: With patient/family Time For Goal Achievement: 05/15/14 Potential to Achieve Goals: Good Progress towards PT goals: Progressing toward goals    Frequency  Min 3X/week    PT Plan Current plan remains appropriate    Co-evaluation             End of Session Equipment Utilized During Treatment: Oxygen Activity Tolerance: Patient limited by fatigue Patient left: in bed;with call bell/phone within reach     Time: 0953-1012 PT Time Calculation (min): 19 min  Charges:  $Therapeutic Exercise: 8-22 mins  G Codes:      Lori [name], Lori [name] 05/03/2014, 1:53 PM  Lori [name], PT, DPT Acute Rehabilitation Services Pager: [phone]

## 2014-05-04 DIAGNOSIS — I10 Essential (primary) hypertension: Secondary | ICD-10-CM

## 2014-05-04 LAB — CBC
HCT: 41 % (ref 36.0–46.0)
HEMOGLOBIN: 13.1 g/dL (ref 12.0–15.0)
MCH: 29.2 pg (ref 26.0–34.0)
MCHC: 32 g/dL (ref 30.0–36.0)
MCV: 91.5 fL (ref 78.0–100.0)
Platelets: 257 10*3/uL (ref 150–400)
RBC: 4.48 MIL/uL (ref 3.87–5.11)
RDW: 14 % (ref 11.5–15.5)
WBC: 10.4 10*3/uL (ref 4.0–10.5)

## 2014-05-04 LAB — BASIC METABOLIC PANEL
BUN: 11 mg/dL (ref 6–23)
CO2: 30 mEq/L (ref 19–32)
Calcium: 9.7 mg/dL (ref 8.4–10.5)
Chloride: 97 mEq/L (ref 96–112)
Creatinine, Ser: 0.86 mg/dL (ref 0.50–1.10)
GFR calc non Af Amer: 64 mL/min — ABNORMAL LOW (ref >90)
GFR, EST AFRICAN AMERICAN: 74 mL/min — AB (ref >90)
GLUCOSE: 92 mg/dL (ref 70–99)
Potassium: 3.9 mEq/L (ref 3.7–5.3)
SODIUM: 140 meq/L (ref 137–147)

## 2014-05-04 MED ORDER — DIPHENOXYLATE-ATROPINE 2.5-0.025 MG PO TABS: 1.0000 | ORAL_TABLET | Freq: Four times a day (QID) | ORAL | Status: DC | PRN

## 2014-05-04 NOTE — Progress Notes (Signed)
Responded to page from social worker to assist patient with Advance Directives.  Directives were completed and notarized.  Provided emotion support  And empathic listening. Patient scheduled for discharge later to day.   05/04/14 1200  Clinical Encounter Type  Visited With Patient;Health care provider  Visit Type Initial;Other (Comment)  Referral From Social work Mudlogger)  Spiritual Encounters  Spiritual Needs Emotional  Stress Factors  Patient Stress Factors None identified  Advance Directives (For Healthcare)  Advance Directive Patient has advance directive, copy in chart  Fae Pippin, pager [phone]

## 2014-05-04 NOTE — Progress Notes (Signed)
Pt d/c to [facility] via ambulance.  Report called to Marisue Ivan, RN, verbalized understanding.  Will continue to monitor.  Lori [name], Charity fundraiser.

## 2014-05-04 NOTE — Progress Notes (Signed)
[facility] PROGRESS NOTE  Lori [name] IWO:[id number] DOB: Jul 31, 1935 DOA: 04/28/2014 PCP: Lori Plum, MD Patient seen and examined today, she denies chest pain and no shortness of breath. She reports diarrhea decreased-only 1 stool overnight/this a.m. and more formed. Filed Vitals:   05/04/14 1004  BP: 115/58  Pulse: 73  Temp:   Resp:   Exam:  General: AAOx3  Cardiovascular:S1S2/RRR, diastolic murmur  Respiratory: fine crackles throughout, no wheezes  Abdomen: soft, Nt, BS [present  Musculoskeletal: no edema no c/c  Assessment/Plan: 1.Atypical chest pain with mildly elevated troponin -likely demand, chest pain free now, repeat enzymes negative  -continue ASA,  -ECHo with normal wall motion, preserved EF and Grade 1 DD  -normal cath in 2001  -Per cardiology to follow up outpatient for myoview  2. Advanced Pulm fibrosis  -on 4-5L home O2, foll by dr.Wert  3. Community acquired pneumonia  -based on Ct chest, although didn't have many symptoms to suggest this  -s/p day 3 of rocephin/zithromax, was changed to Po levaquin  -Continue Levaquin  4. Acute on chronic diastolic CHF  -Improved, continue PO lasix  -FU I/Os, weights  -4.2L negative  -ECHo with grade 1DD and preserved EF  5. Anxiety  - continue xanax PRN  6. Diarrhea  -Stool C. difficile negative -Possibly antibiotic associated -Clinically improved, we'll DC her on antidiarrheal -Followup outpatient with SNF   addendum to discharge medications Note that [name] 2.5-0.025mg  1-2 tablets 4 times a day when necessary diarrhea   Please see full discharge summary of 6/9 for the rest of the details of  patient's hospital stay inccluding discharge medications and follow up care unchanged except the added lomotil as above.  Kela Millin  [facility] Pager [phone]. if 7PM-7AM, please contact night-coverage at [email or web address], password [facility] 05/04/2014, 11:33 AM  LOS: 6 days

## 2014-05-04 NOTE — Progress Notes (Signed)
Occupational Therapy Treatment Patient Details Name: Lori [name] MRN: [id number] DOB: Jul 14, 1935 Today's Date: 05/04/2014    History of present illness Patient is a 79 yo female admitted 04/28/14 with increasing SOB, chest pain.  Patient with CHF.  PMH:  HTN, CHF, depression, anxiety, chronic lung disease, on home O2.   OT comments  Pt making progress with functional goals and OT will continue to follow to increase level of function and safety  Follow Up Recommendations  SNF;Supervision/Assistance - 24 hour    Equipment Recommendations  None recommended by OT;Other (comment) (TBD at next venue of care)    Recommendations for Other Services      Precautions / Restrictions Precautions Precautions: Fall Restrictions Weight Bearing Restrictions: No       Mobility Bed Mobility Overal bed mobility: Modified Independent Bed Mobility: Supine to Sit     Supine to sit: Supervision;HOB elevated        Transfers Overall transfer level: Needs assistance Equipment used: Rolling walker (2 wheeled) Transfers: Sit to/from Stand Sit to Stand: Min assist              Balance   Sitting-balance support: No upper extremity supported;Feet supported Sitting balance-Leahy Scale: Good     Standing balance support: Single extremity supported;Bilateral upper extremity supported;During functional activity;No upper extremity supported Standing balance-Leahy Scale: Fair                     ADL       Grooming: Wash/dry hands;Wash/dry face;Standing;Min guard   Upper Body Bathing: Set up;Min guard;Standing   Lower Body Bathing: Sit to/from stand;Minimal assistance   Upper Body Dressing : Set up;Min guard;Standing   Lower Body Dressing: Sit to/from stand;Minimal assistance;Moderate assistance   Toilet Transfer: Minimal assistance;Regular Toilet;Grab bars;RW   Toileting- Clothing Manipulation and Hygiene: Min guard;Sit to/from stand       Functional mobility during  ADLs: Minimal assistance General ADL Comments: Pt stood at sink wiht RW for UB and LB bathing and to donn clean gown      Vision  wears glasses                   Perception Perception Perception Tested?: No   Praxis Praxis Praxis tested?: Not tested    Cognition   Behavior During Therapy: [facility] for tasks assessed/performed Overall Cognitive Status: Within Functional Limits for tasks assessed                                                 General Comments  Pt very pleasant and cooperative    Pertinent Vitals/ Pain       No c/o pain, VSS                                            Prior Functioning/Environment  independent, caregiver for husband           Frequency Min 2X/week     Progress Toward Goals  OT Goals(current goals can now be found in the care plan section)  Progress towards OT goals: Progressing toward goals     Plan Discharge plan remains appropriate  End of Session Equipment Utilized During Treatment: Rolling walker;Oxygen   Activity Tolerance Patient tolerated treatment well   Patient Left in bed;with call bell/phone within reach;with family/visitor present;Other (comment);with bed alarm set (sitting EOB, physician in room)             Time: [id number] OT Time Calculation (min): 25 min  Charges: OT General Charges $OT Visit: 1 Procedure OT Treatments $Self Care/Home Management : 8-22 mins $Therapeutic Activity: 8-22 mins  Galen ManilaSpencer, Ashlee Player Jeanette 05/04/2014, 1:14 PM

## 2014-05-05 LAB — CULTURE, BLOOD (ROUTINE X 2)
Culture: NO GROWTH
Culture: NO GROWTH

## 2014-05-08 ENCOUNTER — Non-Acute Institutional Stay (SKILLED_NURSING_FACILITY): Payer: Medicare HMO | Admitting: Adult Health

## 2014-05-08 ENCOUNTER — Encounter: Payer: Self-pay | Admitting: Adult Health

## 2014-05-08 DIAGNOSIS — J189 Pneumonia, unspecified organism: Secondary | ICD-10-CM

## 2014-05-08 DIAGNOSIS — F419 Anxiety disorder, unspecified: Secondary | ICD-10-CM

## 2014-05-08 DIAGNOSIS — I5032 Chronic diastolic (congestive) heart failure: Secondary | ICD-10-CM

## 2014-05-08 DIAGNOSIS — J8410 Pulmonary fibrosis, unspecified: Secondary | ICD-10-CM

## 2014-05-08 DIAGNOSIS — K219 Gastro-esophageal reflux disease without esophagitis: Secondary | ICD-10-CM

## 2014-05-08 DIAGNOSIS — F411 Generalized anxiety disorder: Secondary | ICD-10-CM

## 2014-05-08 DIAGNOSIS — I10 Essential (primary) hypertension: Secondary | ICD-10-CM

## 2014-05-09 ENCOUNTER — Non-Acute Institutional Stay (SKILLED_NURSING_FACILITY): Payer: Medicare HMO | Admitting: Internal Medicine

## 2014-05-09 DIAGNOSIS — I10 Essential (primary) hypertension: Secondary | ICD-10-CM

## 2014-05-09 DIAGNOSIS — J8410 Pulmonary fibrosis, unspecified: Secondary | ICD-10-CM

## 2014-05-09 DIAGNOSIS — I509 Heart failure, unspecified: Secondary | ICD-10-CM

## 2014-05-09 DIAGNOSIS — J189 Pneumonia, unspecified organism: Secondary | ICD-10-CM

## 2014-05-10 NOTE — Progress Notes (Signed)
HISTORY & PHYSICAL  DATE: 05/09/2014   FACILITY: [facility]  LEVEL OF CARE: SNF (31)  ALLERGIES:  No Known Allergies  CHIEF COMPLAINT:  Manage pneumonia, CHF and hypertension  HISTORY OF PRESENT ILLNESS: 79 year old Caucasian female was hospitalized with atypical chest pain. After hospitalization she is admitted to this facility for short-term rehabilitation.  PNEUMONIA: The pneumonia remains stable.  The patient denies ongoing chest pain, cough, shortness of breath, fever, chills or night sweats. No complications reported from the current antibiotic being used.  HTN: Pt 's HTN remains stable.  Denies CP, sob, DOE, pedal edema, headaches, dizziness or visual disturbances.  No complications from the medications currently being used.  Last BP : 125/65.  CHF:The patient does not relate significant weight changes, denies sob, DOE, orthopnea, PNDs, pedal edema, palpitations or chest pain.  CHF remains stable.  No complications form the medications being used.  PAST MEDICAL HISTORY :  Past Medical History  Diagnosis Date  . RBBB   . HYPERTENSION   . DIASTOLIC HEART FAILURE, CHRONIC     a. 05/2008 Echo: EF 55-60%, Diast Dysfxn, mild-mod AI, triv MR;  b. 07/2012 Echo: EF 60-65%, Gr 1 DD, Mid AI.  Marland Kitchen. AORTIC INSUFFICIENCY     a. mild-mod by echo 05/2008;  b. mild by echo 07/2012  . OSTEOPOROSIS   . DEPRESSION   . Chronic lung disease     a. on home O2;  b. 07/2012 CTA chest: chronic sev lung dzs, similar to 2009 CT, stable R paratracheal nodes.  . OBSTRUCTIVE SLEEP APNEA     pt denies this hx (08/17/2012) - though previously seen by Dr. Shelle Ironlance for this Dx.  . Daily headache   . Arthritis     "knees" (08/17/2012)  . Anxiety   . Depression   . Aortic valve disorders   . Respiratory failure, chronic   . GERD (gastroesophageal reflux disease)     PAST SURGICAL HISTORY: Past Surgical History  Procedure Laterality Date  . Rhinoplasty  1953    "broke it"     SOCIAL HISTORY:  reports that she has never smoked. She has never used smokeless tobacco. She reports that she drinks about 4.2 ounces of alcohol per week. She reports that she does not use illicit drugs.  FAMILY HISTORY:  Family History  Problem Relation Age of Onset  . Heart disease Sister   . Cancer Father     died in his 4260's  . Other Mother     died @ 4989 - "old age"  . Stroke Sister     deceased  . Cancer Brother     deceased    CURRENT MEDICATIONS: Reviewed per MAR/see medication list  REVIEW OF SYSTEMS:  PSYCHIATRIC: Complains of difficulty sleeping at night ,See HPI otherwise 14 point ROS is negative.  PHYSICAL EXAMINATION  VS:  See VS section  GENERAL: no acute distress, normal body habitus EYES: conjunctivae normal, sclerae normal, normal eye lids MOUTH/THROAT: lips without lesions,no lesions in the mouth,tongue is without lesions,uvula elevates in midline NECK: supple, trachea midline, no neck masses, no thyroid tenderness, no thyromegaly LYMPHATICS: no LAN in the neck, no supraclavicular LAN RESPIRATORY: breathing is even & unlabored, BS CTAB CARDIAC: RRR, no murmur,no extra heart sounds, no edema GI:  ABDOMEN: abdomen soft, normal BS, no masses, no tenderness  LIVER/SPLEEN: no hepatomegaly, no splenomegaly MUSCULOSKELETAL: HEAD: normal to inspection  EXTREMITIES: LEFT UPPER EXTREMITY: full range of motion, normal  strength & tone RIGHT UPPER EXTREMITY:  full range of motion, normal strength & tone LEFT LOWER EXTREMITY:  full range of motion, normal strength & tone RIGHT LOWER EXTREMITY:  full range of motion, normal strength & tone PSYCHIATRIC: the patient is alert & oriented to person, affect & behavior appropriate  LABS/RADIOLOGY:  Labs reviewed: Basic Metabolic Panel:  Recent Labs  54/09/81 0340 05/01/14 0339 05/04/14 0545  NA 139 142 140  K 4.0 4.5 3.9  CL 99 100 97  CO2 29 33* 30  GLUCOSE 86 93 92  BUN 15 14 11   CREATININE 0.88 1.05  0.86  CALCIUM 8.8 9.4 9.7   CBC:  Recent Labs  07/04/13 1247  05/02/14 0415 05/03/14 0511 05/04/14 0545  WBC 12.4*  < > 10.8* 11.0* 10.4  NEUTROABS 8.1*  --   --   --   --   HGB 14.4  < > 13.5 13.5 13.1  HCT 43.8  < > 41.3 41.9 41.0  MCV 90.3  < > 91.8 92.5 91.5  PLT 288.0  < > 274 248 257  < > = values in this interval not displayed.  Cardiac Enzymes:  Recent Labs  04/29/14 0340 04/29/14 1601 04/29/14 2138  TROPONINI 0.36* <0.30 <0.30    Transthoracic Echocardiography  (Report amended )  Patient:    Lori [name], Lori [name] MR #:       [id number] Study Date: 04/29/2014 Gender:     F Age:        72 Height:     152.4 cm Weight:     64.7 kg BSA:        1.68 m^2 Pt. Status: Room:       [facility]   SONOGRAPHER  [facility], RDCS  ORDERING     Carron Curie [id number]  PERFORMING   [facility], Inpatient  cc:  ------------------------------------------------------------------- LV EF: 55% -   60%  ------------------------------------------------------------------- Indications:      518.4 Pulmonary Edema, Acute.  ------------------------------------------------------------------- History:   PMH:   Dyspnea and murmur.  Congestive heart failure.   ------------------------------------------------------------------- Study Conclusions  - Left ventricle: The cavity size was normal. Systolic function was   normal. The estimated ejection fraction was in the range of 55%   to 60%. Wall motion was normal; there were no regional wall   motion abnormalities. There was an increased relative   contribution of atrial contraction to ventricular filling.   Doppler parameters are consistent with abnormal left ventricular   relaxation (grade 1 diastolic dysfunction). - Ventricular septum: The contour showed moderate leftward   deviation. These changes are consistent with RV pressure   overload. - Aortic valve: Moderate thickening. There was mild regurgitation. - Right ventricle: The cavity  size was moderately dilated. Wall   thickness was normal. Systolic function was severely reduced. - Right atrium: The atrium was moderately dilated. - Atrial septum: There was increased thickness of the septum,   consistent with lipomatous hypertrophy. - Tricuspid valve: There was moderate regurgitation. - Pulmonary arteries: PA peak pressure: 74 mm Hg (S).  Transthoracic echocardiography.  M-mode, complete 2D, spectral Doppler, and color Doppler.  Birthdate:  Patient birthdate: 1935/02/04.  Age:  Patient is 79 yr old.  Sex:  Gender: female. Height:  Height: 152.4 cm. Height: 60 in.  Weight:  Weight: 64.7 kg. Weight: 142.3 lb.  Body mass index:  BMI: 27.9 kg/m^2.  Body surface area:    BSA: 1.68 m^2.  Blood pressure:     99/38  Patient status:  Inpatient.  Study date:  Study date: 04/29/2014. Study time: 10:35 AM.  Location:  ICU/CCU  -------------------------------------------------------------------  ------------------------------------------------------------------- Left ventricle:  The cavity size was normal. Systolic function was normal. The estimated ejection fraction was in the range of 55% to 60%. Wall motion was normal; there were no regional wall motion abnormalities. There was an increased relative contribution of atrial contraction to ventricular filling. Doppler parameters are consistent with abnormal left ventricular relaxation (grade 1 diastolic dysfunction).  ------------------------------------------------------------------- Aortic valve:   Trileaflet.  Moderate thickening. Mobility was not restricted.  Doppler:  Transvalvular velocity was within the normal range. There was no stenosis. There was mild regurgitation.  ------------------------------------------------------------------- Aorta:  Aortic root: The aortic root was normal in size.  ------------------------------------------------------------------- Mitral valve:   Structurally normal valve.   Mobility  was not restricted.  Doppler:  Transvalvular velocity was within the normal range. There was no evidence for stenosis. There was no regurgitation.  ------------------------------------------------------------------- Left atrium:  The atrium was normal in size.  ------------------------------------------------------------------- Atrial septum:  There was increased thickness of the septum, consistent with lipomatous hypertrophy.  ------------------------------------------------------------------- Right ventricle:  RV moderator band at apex of no clinical significance. The cavity size was moderately dilated. Wall thickness was normal. Systolic function was severely reduced.  ------------------------------------------------------------------- Ventricular septum:   The contour showed moderate leftward deviation. These changes are consistent with RV pressure overload.   ------------------------------------------------------------------- Pulmonic valve:    Structurally normal valve.   Cusp separation was normal.  Doppler:  Transvalvular velocity was within the normal range. There was no evidence for stenosis. There was no regurgitation.  ------------------------------------------------------------------- Tricuspid valve:   Structurally normal valve.    Doppler: Transvalvular velocity was within the normal range. There was moderate regurgitation.  ------------------------------------------------------------------- Pulmonary artery:   The main pulmonary artery was normal-sized. Systolic pressure was within the normal range.  ------------------------------------------------------------------- Right atrium:  The atrium was moderately dilated.  ------------------------------------------------------------------- Pericardium:  There was no pericardial effusion.  ------------------------------------------------------------------- Systemic veins: Inferior vena cava: The vessel was normal  in size.  ------------------------------------------------------------------- Amended  CT ANGIOGRAPHY CHEST WITH CONTRAST   TECHNIQUE: Multidetector CT imaging of the chest was performed using the standard protocol during bolus administration of intravenous contrast. Multiplanar CT image reconstructions and MIPs were obtained to evaluate the vascular anatomy.   CONTRAST:  100mL OMNIPAQUE IOHEXOL 350 MG/ML SOLN   COMPARISON:  Chest radiograph April 28, 2014 and chest CT angiogram August 18, 2012   FINDINGS: There is no demonstrable pulmonary embolus. There is no thoracic aortic aneurysm or dissection. The right subclavian artery has anaberrant course, passing posterior to the esophagus.   There is underlying emphysematous change. There is extensive interstitial fibrosis bilaterally with multiple areas of patchy consolidation throughout both lungs, a finding that was also present previously. In comparison with the previous study from 2013, there is new opacity in the right upper lobe anterior segment.   There are remains generalized soft tissue thickening in the mediastinum to the left of the trachea extending from the upper thoracic trachea on the left to the level of the carinal, stable. There are multiple lymph nodes in this area, stable. No new lymph adenopathy is appreciable on this study.   Heart is mildly enlarged. There is slight pericardial thickening which is stable.   Visualized upper abdominal structures appear unremarkable. There are no blastic or lytic bone lesions. There is degenerative change in the thoracic spine. Thyroid appears unremarkable.   Review of the MIP images confirms the above findings.  IMPRESSION: No demonstrable pulmonary embolus.   Extensive lung fibrosis with underlying emphysema. In comparison with the prior study, there is new airspace consolidation in portions of the right upper lobe, primarily in the anterior segment. These areas  have an appearance more suspicious for pneumonia than mass, although a mass could easily be obscured by the degree of consolidation and fibrosis in this area. In this regard, would advise followup study in 3-4 weeks to further assess. If this area persists without change at that time, it would be entirely reasonable and advisable to consider PET-CT with particular attention to the right upper lobe region.   Thoracic adenopathy is stable. No new soft tissue thickening or lymph node prominence is seen in the mediastinum.   Heart is enlarged with mild pericardial thickening, stable CHEST  2 VIEW   COMPARISON:  04/28/2014   FINDINGS: The heart is enlarged. There are patchy parenchymal infiltrates bilaterally consistent with pulmonary fibrosis and unchanged.   IMPRESSION: No significant change in the appearance of pulmonary opacities.    ASSESSMENT/PLAN:  Pneumonia-continue Levaquin as prescribed Hypertension-well-controlled CHF-compensated Insomnia-start melatonin 5 mg each bedtime and Xanax 0.25 mg each bedtime. Pulmonary fibrosis-advanced. Continue oxygen. GERD-continue Pepcid  I spoke with patient's family at bedside and discussed her insomnia, and medications.  I have reviewed patient's medical records received at admission/from hospitalization.  CPT CODE: 16109  Angela Cox, MD [facility] [phone]

## 2014-05-23 ENCOUNTER — Encounter: Payer: Self-pay | Admitting: Adult Health

## 2014-05-23 ENCOUNTER — Non-Acute Institutional Stay (SKILLED_NURSING_FACILITY): Payer: Medicare HMO | Admitting: Adult Health

## 2014-05-23 DIAGNOSIS — I10 Essential (primary) hypertension: Secondary | ICD-10-CM

## 2014-05-23 DIAGNOSIS — F411 Generalized anxiety disorder: Secondary | ICD-10-CM

## 2014-05-23 DIAGNOSIS — G4700 Insomnia, unspecified: Secondary | ICD-10-CM

## 2014-05-23 DIAGNOSIS — K219 Gastro-esophageal reflux disease without esophagitis: Secondary | ICD-10-CM

## 2014-05-23 DIAGNOSIS — F419 Anxiety disorder, unspecified: Secondary | ICD-10-CM

## 2014-05-23 DIAGNOSIS — I5032 Chronic diastolic (congestive) heart failure: Secondary | ICD-10-CM

## 2014-05-23 DIAGNOSIS — J8410 Pulmonary fibrosis, unspecified: Secondary | ICD-10-CM

## 2014-05-23 NOTE — Progress Notes (Signed)
Patient ID: Lori LongEilene Tomkins, female   DOB: 05/17/1935, 79 y.o.   MRN: [id number]               PROGRESS NOTE  DATE: 05/08/2014  FACILITY: Nursing Home [facility]  LEVEL OF CARE: SNF (31)  Acute Visit  CHIEF COMPLAINT:  Follow-up Hospitalization  HISTORY OF PRESENT ILLNESS: This is a 79 year old female who has [facility]  with Acute on chronic diastolic heart failure and CAP (Community acquired pneumonia). She has been admitted for a short-term rehabilitation.   REASSESSMENT OF ONGOING PROBLEM(S):  HTN: Pt 's HTN remains stable.  Denies CP, sob, DOE, pedal edema, headaches, dizziness or visual disturbances.  No complications from the medications currently being used.  Last BP : 124/78  GERD: pt's GERD is stable.  Denies ongoing heartburn, abd. Pain, nausea or vomiting.  Currently on a PPI & tolerates it without any adverse reactions.  ANXIETY: The anxiety remains stable. Patient denies ongoing anxiety or irritability. No complications reported from the medications currently being used.   PAST MEDICAL HISTORY : Reviewed.  No changes/see problem list  CURRENT MEDICATIONS: Reviewed per MAR/see medication list  REVIEW OF SYSTEMS:  GENERAL: no change in appetite, no fatigue, no weight changes, no fever, chills or weakness RESPIRATORY: no cough, SOB, DOE, wheezing, hemoptysis CARDIAC: no chest pain or palpitations, +edema GI: no abdominal pain, diarrhea, constipation, heart burn, nausea or vomiting  PHYSICAL EXAMINATION  GENERAL: no acute distress, normal body habitus, hard of hearing EYES: conjunctivae normal, sclerae normal, normal eye lids NECK: supple, trachea midline, no neck masses, no thyroid tenderness, no thyromegaly LYMPHATICS: no LAN in the neck, no supraclavicular LAN RESPIRATORY: breathing is even & unlabored, BS CTAB CARDIAC: RRR, has diastolic murmur,no extra heart sounds, BLE edema  2+ GI: abdomen soft, normal BS, no masses, no tenderness, no hepatomegaly, no splenomegaly EXTREMITIES:  Able to move all 4 extremities PSYCHIATRIC: the patient is alert & oriented to person, anxious   LABS/RADIOLOGY: Labs reviewed: Basic Metabolic Panel:  Recent Labs  40/98/1105/05/08 0340 05/01/14 0339 05/04/14 0545  NA 139 142 140  K 4.0 4.5 3.9  CL 99 100 97  CO2 29 33* 30  GLUCOSE 86 93 92  BUN 15 14 11   CREATININE 0.88 1.05 0.86  CALCIUM 8.8 9.4 9.7   CBC:  Recent Labs  07/04/13 1247  05/02/14 0415 05/03/14 0511 05/04/14 0545  WBC 12.4*  < > 10.8* 11.0* 10.4  NEUTROABS 8.1*  --   --   --   --   HGB 14.4  < > 13.5 13.5 13.1  HCT 43.8  < > 41.3 41.9 41.0  MCV 90.3  < > 91.8 92.5 91.5  PLT 288.0  < > 274 248 257  < > = values in this interval not displayed.  Cardiac Enzymes:  Recent Labs  04/29/14 0340 04/29/14 1601 04/29/14 2138  TROPONINI 0.36* <0.30 <0.30     ASSESSMENT/PLAN:  CAP - just finished Levaquin treatment Chronic diastolic heart failure - she was non-compliant and ran out of Lasix; stable; continue Lasix Pulmonary fibrosis - continue O2 @ 4L/min via [place] continuously Anxiety - start Xanax 0.25 mg PO BID Hypertension - well-controlled; continue Losartan GERD - continue Protonix and Pepcid    CPT CODE: [id number]  Ella BodoMonina Vargas - NP [facility] [phone]

## 2014-05-23 NOTE — Progress Notes (Signed)
Patient ID: Lori LongEilene Packman, female   DOB: June 22, 1935, 79 y.o.   MRN: [id number]              PROGRESS NOTE  DATE:  05/23/14  FACILITY: Nursing Home [facility]  LEVEL OF CARE: SNF (31)  Acute Visit  CHIEF COMPLAINT:  Discharge Notes  HISTORY OF PRESENT ILLNESS: This is a 79 year old female who is for discharge home with Home health PT, OT and Nursing. DME: rolling walker with a seat, O2 @ 4L/min via [place] continuously, stationary and portable and bedside commode. [facility] with Acute on chronic diastolic heart failure and CAP (Community acquired pneumonia). Patient was admitted to this facility for short-term rehabilitation after the patient's recent hospitalization.  Patient has completed SNF rehabilitation and therapy has cleared the patient for discharge.   REASSESSMENT OF ONGOING PROBLEM(S):  HTN: Pt 's HTN remains stable.  Denies CP, sob, DOE, pedal edema, headaches, dizziness or visual disturbances.  No complications from the medications currently being used.  Last BP : 129/72  CHF:The patient does not relate significant weight changes, denies sob, DOE, orthopnea, PNDs,  palpitations or chest pain.  CHF remains stable.  No complications form the medications being used.  INSOMNIA: The insomnia remains stable.  No complications noted from the medications presently being used. Patient denies ongoing insomnia, pain, hallucinations, delusions.  PAST MEDICAL HISTORY : Reviewed.  No changes/see problem list  CURRENT MEDICATIONS: Reviewed per MAR/see medication list  REVIEW OF SYSTEMS:  GENERAL: no change in appetite, no fatigue, no weight changes, no fever, chills or weakness RESPIRATORY: no cough, SOB, DOE, wheezing, hemoptysis CARDIAC: no chest pain or palpitations, +edema GI: no abdominal pain, diarrhea, constipation, heart burn, nausea or vomiting  PHYSICAL EXAMINATION  GENERAL: no acute  distress, normal body habitus, hard of hearing NECK: supple, trachea midline, no neck masses, no thyroid tenderness, no thyromegaly LYMPHATICS: no LAN in the neck, no supraclavicular LAN RESPIRATORY: breathing is even & unlabored, BS CTAB CARDIAC: RRR, has diastolic murmur,no extra heart sounds, BLE edema 2+ GI: abdomen soft, normal BS, no masses, no tenderness, no hepatomegaly, no splenomegaly EXTREMITIES:  Able to move all 4 extremities PSYCHIATRIC: the patient is alert & oriented to person, anxious   LABS/RADIOLOGY: Labs reviewed: Basic Metabolic Panel:  Recent Labs  40/98/1105/05/08 0340 05/01/14 0339 05/04/14 0545  NA 139 142 140  K 4.0 4.5 3.9  CL 99 100 97  CO2 29 33* 30  GLUCOSE 86 93 92  BUN 15 14 11   CREATININE 0.88 1.05 0.86  CALCIUM 8.8 9.4 9.7   CBC:  Recent Labs  07/04/13 1247  05/02/14 0415 05/03/14 0511 05/04/14 0545  WBC 12.4*  < > 10.8* 11.0* 10.4  NEUTROABS 8.1*  --   --   --   --   HGB 14.4  < > 13.5 13.5 13.1  HCT 43.8  < > 41.3 41.9 41.0  MCV 90.3  < > 91.8 92.5 91.5  PLT 288.0  < > 274 248 257  < > = values in this interval not displayed.  Cardiac Enzymes:  Recent Labs  04/29/14 0340 04/29/14 1601 04/29/14 2138  TROPONINI 0.36* <0.30 <0.30     CLINICAL DATA:  Chest pressure and shortness of Breath   EXAM: CT ANGIOGRAPHY CHEST WITH CONTRAST   TECHNIQUE: Multidetector CT imaging of the chest was performed using the standard protocol during bolus administration of intravenous contrast. Multiplanar  CT image reconstructions and MIPs were obtained to evaluate the vascular anatomy.   CONTRAST:  100mL OMNIPAQUE IOHEXOL 350 MG/ML SOLN   COMPARISON:  Chest radiograph April 28, 2014 and chest CT angiogram August 18, 2012   FINDINGS: There is no demonstrable pulmonary embolus. There is no thoracic aortic aneurysm or dissection. The right subclavian artery has anaberrant course, passing posterior to the esophagus.   There is underlying  emphysematous change. There is extensive interstitial fibrosis bilaterally with multiple areas of patchy consolidation throughout both lungs, a finding that was also present previously. In comparison with the previous study from 2013, there is new opacity in the right upper lobe anterior segment.   There are remains generalized soft tissue thickening in the mediastinum to the left of the trachea extending from the upper thoracic trachea on the left to the level of the carinal, stable. There are multiple lymph nodes in this area, stable. No new lymph adenopathy is appreciable on this study.   Heart is mildly enlarged. There is slight pericardial thickening which is stable.   Visualized upper abdominal structures appear unremarkable. There are no blastic or lytic bone lesions. There is degenerative change in the thoracic spine. Thyroid appears unremarkable.   Review of the MIP images confirms the above findings.   IMPRESSION: No demonstrable pulmonary embolus.   Extensive lung fibrosis with underlying emphysema. In comparison with the prior study, there is new airspace consolidation in portions of the right upper lobe, primarily in the anterior segment. These areas have an appearance more suspicious for pneumonia than mass, although a mass could easily be obscured by the degree of consolidation and fibrosis in this area. In this regard, would advise followup study in 3-4 weeks to further assess. If this area persists without change at that time, it would be entirely reasonable and advisable to consider PET-CT with particular attention to the right upper lobe region.   Thoracic adenopathy is stable. No new soft tissue thickening or lymph node prominence is seen in the mediastinum.   Heart is enlarged with mild pericardial thickening, stable. CLINICAL DATA:  Shortness of breath.   EXAM: CHEST  2 VIEW   COMPARISON:  04/28/2014   FINDINGS: The heart is enlarged. There are  patchy parenchymal infiltrates bilaterally consistent with pulmonary fibrosis and unchanged.   IMPRESSION: No significant change in the appearance of pulmonary opacities.   ASSESSMENT/PLAN:  CAP - Resoved Chronic diastolic heart failure -  stable; continue Lasix Pulmonary fibrosis - continue O2 @ 4L/min via [place] continuously Anxiety - continue Xanax  Hypertension - well-controlled; continue Losartan GERD - continue Protonix and Pepcid Insomnia - recently started on Melatonin 5 mg PO Q HS   I have filled out patient's discharge paperwork and written prescriptions.  Patient will receive home health PT, OT and Nursing.  DME provided: rolling walker with a seat, O2 @ 4L/min via [place] continuously, stationary and portable and bedside commode  Total discharge time: Greater than 30 minutes  Discharge time involved coordination of the discharge process with social worker, nursing staff and therapy department. Medical justification for home health services/DME verified.    CPT CODE: [id number]   Ella BodoMonina Vargas - NP [facility] [phone]

## 2014-05-25 ENCOUNTER — Encounter: Payer: Medicare HMO | Admitting: Physician Assistant

## 2014-06-14 ENCOUNTER — Ambulatory Visit (INDEPENDENT_AMBULATORY_CARE_PROVIDER_SITE_OTHER): Payer: Medicare HMO | Admitting: Physician Assistant

## 2014-06-14 ENCOUNTER — Encounter: Payer: Self-pay | Admitting: Physician Assistant

## 2014-06-14 VITALS — BP 110/60 | HR 68 | Ht 60.0 in | Wt 138.0 lb

## 2014-06-14 DIAGNOSIS — J8410 Pulmonary fibrosis, unspecified: Secondary | ICD-10-CM

## 2014-06-14 DIAGNOSIS — I279 Pulmonary heart disease, unspecified: Secondary | ICD-10-CM

## 2014-06-14 DIAGNOSIS — I2781 Cor pulmonale (chronic): Secondary | ICD-10-CM

## 2014-06-14 DIAGNOSIS — I10 Essential (primary) hypertension: Secondary | ICD-10-CM

## 2014-06-14 DIAGNOSIS — I2510 Atherosclerotic heart disease of native coronary artery without angina pectoris: Secondary | ICD-10-CM

## 2014-06-14 DIAGNOSIS — I5032 Chronic diastolic (congestive) heart failure: Secondary | ICD-10-CM

## 2014-06-14 DIAGNOSIS — J189 Pneumonia, unspecified organism: Secondary | ICD-10-CM

## 2014-06-14 LAB — BASIC METABOLIC PANEL
BUN: 31 mg/dL — ABNORMAL HIGH (ref 6–23)
CHLORIDE: 99 meq/L (ref 96–112)
CO2: 32 mEq/L (ref 19–32)
Calcium: 9.5 mg/dL (ref 8.4–10.5)
Creatinine, Ser: 0.9 mg/dL (ref 0.4–1.2)
GFR: 62.08 mL/min (ref >60.00)
Glucose, Bld: 88 mg/dL (ref 70–99)
Potassium: 4.5 mEq/L (ref 3.5–5.1)
SODIUM: 140 meq/L (ref 135–145)

## 2014-06-14 NOTE — Patient Instructions (Signed)
Your physician recommends that you continue on your current medications as directed. Please refer to the Current Medication list given to you today.  Your physician recommends that you go to the lab today for a BMET  Your physician has requested that you have a lexiscan myoview. For further information please visit [email or web address]. Please follow instruction sheet, as given. (if possible schedule in [place])  Your physician recommends that you schedule a follow-up appointment with Pulmonologist. Pt Sees Dr. Sherene SiresWert. She needs to be transferred to another pulmonologist in same practice that goes to the [place] office. Please set up for pt. She needs a f/u with in the next month.  Your physician recommends that you schedule a follow-up appointment with Dr Mariah MillingGollan or Kirke CorinArida in [place] in 3 months.

## 2014-06-14 NOTE — Progress Notes (Signed)
Cardiology Office Note   Date:  06/14/2014   ID:  Lori [name], DOB 02/06/1935, MRN [id number]  PCP:  Pcp Not In System  Cardiologist:  Dr. Valera Castlehomas Wall >>> [place]    History of Present Illness: Lori [name] is a 79 y.o. female with a hx of diastolic CHF, HTN, AI, chronic respiratory failure and pulmonary fibrosis (followed by Dr. Sherene SiresWert).  Last seen by Dr. Valera Castlehomas Wall 06/2013.    She was seen in the office 04/28/14 with worsening dyspnea and chest pain. She was sent to the hospital for admission. She remained in the hospital 6/5-6/9.  Chest CT was neg for PE but did demonstrate a potential area concerning for pneumonia (RUL). She was treated with antibiotics. She was diuresed with IV Lasix. Of note, she had been out of her Lasix for several weeks. Echocardiogram did demonstrate normal LV function, severe pulmonary HTN, severe RV dysfunction and evidence of cor pulmonale. She was discharged to skilled nursing. She returns for follow up. Of note, she had 2 spurious abnormal troponins (POC 0.16, TnI 0.36). The remaining troponins were normal. It was felt that she could be considered for an outpatient Myoview when she returns for followup.  She returns with her daughter. She is doing well. She is now living [place] [facility] [facility] She feels much better. She still notes some chronic dyspnea with exertion. She denies chest pain. She's been doing physical therapy. She denies orthopnea, PND or edema. She denies syncope.   Studies:  - LHC (06/2000):  No CAD. EF 70%.  - Echo (04/29/14):  EF 55-60%, no RWMA, Gr 1 DD, mild AI, mod RVE, severe reduced RV function, mod RAE, atrial septal lipomatous hypertrophy, mod TR, PASP 74 mmHg Dysfunction.  Mild AI.  MAC.   Chest CTA (04/28/14): IMPRESSION:  No demonstrable pulmonary embolus.  Extensive lung fibrosis with underlying emphysema. In comparison  with the prior study, there is new airspace consolidation in    portions of the right upper lobe, primarily in the anterior segment.  These areas have an appearance more suspicious for pneumonia than  mass, although a mass could easily be obscured by the degree of  consolidation and fibrosis in this area. In this regard, would  advise followup study in 3-4 weeks to further assess. If this area  persists without change at that time, it would be entirely  reasonable and advisable to consider PET-CT with particular  attention to the right upper lobe region.  Thoracic adenopathy is stable. No new soft tissue thickening or  lymph node prominence is seen in the mediastinum.  Heart is enlarged with mild pericardial thickening, stable.   Recent Labs: 04/28/2014: Pro B Natriuretic peptide (BNP) 3118.0*  05/04/2014: Creatinine 0.86; Hemoglobin 13.1; Potassium 3.9   Wt Readings from Last 3 Encounters:  06/14/14 138 lb (62.596 kg)  05/23/14 141 lb 6.4 oz (64.139 kg)  05/08/14 143 lb (64.864 kg)     Past Medical History  Diagnosis Date  . RBBB   . HYPERTENSION   . DIASTOLIC HEART FAILURE, CHRONIC     a. 05/2008 Echo: EF 55-60%, Diast Dysfxn, mild-mod AI, triv MR;  b. 07/2012 Echo: EF 60-65%, Gr 1 DD, Mid AI.  Marland Kitchen. AORTIC INSUFFICIENCY     a. mild-mod by echo 05/2008;  b. mild by echo 07/2012  . OSTEOPOROSIS   . DEPRESSION   . Chronic lung disease     a. on home O2;  b. 07/2012 CTA chest:  chronic sev lung dzs, similar to 2009 CT, stable R paratracheal nodes.  . OBSTRUCTIVE SLEEP APNEA     pt denies this hx (08/17/2012) - though previously seen by Dr. Shelle Iron for this Dx.  . Daily headache   . Arthritis     "knees" (08/17/2012)  . Anxiety   . Depression   . Aortic valve disorders   . Respiratory failure, chronic   . GERD (gastroesophageal reflux disease)     Current Outpatient Prescriptions  Medication Sig Dispense Refill  . ALPRAZolam (XANAX) 0.25 MG tablet Take 1 tablet (0.25 mg total) by mouth every 8 (eight) hours as needed for anxiety.  30 tablet  0   . ascorbic acid (VITAMIN C) 500 MG tablet Take 1,000 mg by mouth daily.       Marland Kitchen aspirin 81 MG tablet Take 325 mg by mouth daily.      . Cholecalciferol (VITAMIN D-3) 1000 UNITS CAPS Take 1 capsule by mouth daily.      . famotidine (PEPCID) 20 MG tablet Take 20 mg by mouth daily.      . furosemide (LASIX) 40 MG tablet TAKE 1 TABLET (40 MG TOTAL) BY MOUTH DAILY.  30 tablet  2  . losartan (COZAAR) 100 MG tablet Take 100 mg by mouth daily. For HTN      . Melatonin 5 MG TABS Take 5 mg by mouth at bedtime.      . metoprolol (LOPRESSOR) 50 MG tablet Take 1 tablet (50 mg total) by mouth 2 (two) times daily.  180 tablet  0  . nitroGLYCERIN (NITROSTAT) 0.4 MG SL tablet Place 1 tablet (0.4 mg total) under the tongue every 5 (five) minutes as needed. For chest pain  25 tablet  3  . pantoprazole (PROTONIX) 40 MG tablet take 1 tablet by mouth daily before BREAKFAST  90 tablet  0  . potassium chloride SA (K-DUR,KLOR-CON) 20 MEQ tablet TAKE 1 TABLET (20 MEQ TOTAL) BY MOUTH DAILY.  90 tablet  0   No current facility-administered medications for this visit.    Allergies:   Review of patient's allergies indicates no known allergies.   Social History:  The patient  reports that she has never smoked. She has never used smokeless tobacco. She reports that she drinks about 4.2 ounces of alcohol per week. She reports that she does not use illicit drugs.   Family History:  The patient's family history includes Cancer in her brother and father; Heart attack in her brother, sister, and another family member; Heart disease in her sister; Hyperlipidemia in her sister; Hypertension in her sister; Other in her mother; Stroke in her sister.   ROS:  Please see the history of present illness.   She has a non-productive cough.   All other systems reviewed and negative.   PHYSICAL EXAM: VS:  BP 110/60  Pulse 68  Ht 5' (1.524 m)  Wt 138 lb (62.596 kg)  BMI 26.95 kg/m2 Well nourished, well developed, in no acute  distress HEENT: normal Neck: no JVDat 90 degrees Cardiac:  normal S1, S2; RRR; no  Murmur  Lungs:  Decrease breath sounds, dry crackles at bases   Abd: soft, nontender, no hepatomegaly Ext: no edema Skin: warm and dry Neuro:  CNs 2-12 intact, no focal abnormalities noted  EKG:  NSR, HR 75, inc RBBB, TWI in 2, 3, aVF, V3-5, no change  ASSESSMENT AND PLAN:  DIASTOLIC HEART FAILURE, CHRONIC Volume appears stable. Obtain followup basic metabolic panel today. Continue current  therapy.  Cor pulmonale, chronic Continue current therapy. Pulmonary hypertension secondary to pulmonary fibrosis.  HYPERTENSION Controlled.  Pulmonary fibrosis Followup with pulmonology. She will be followed in [place]. We will try to arrange an appointment for her.  Coronary artery calcification seen on CAT scan She denies chest pain. She had a couple of questionable, abnormal troponins in the hospital. She had coronary calcification noted by one of our cardiologists on CT scan.  She continues to note dyspnea with exertion. This is all likely related to her chronic lung disease. However, ischemia should be ruled out. Arrange Lexiscan Myoview.  CAP (community acquired pneumonia) Patient had questionable findings of pneumonia. Followup scan was recommended. Followup with pulmonology as noted above.  Disposition:  F/u with Dr. Mariah Milling or Kirke Corin in [place].  Signed, Brynda Rim, MHS 06/14/2014 2:19 PM    [facility] [address] [place], [place], [place]  [address] Phone: [phone]; Fax: [phone]

## 2014-06-15 ENCOUNTER — Telehealth: Payer: Self-pay

## 2014-06-15 ENCOUNTER — Telehealth: Payer: Self-pay | Admitting: *Deleted

## 2014-06-15 DIAGNOSIS — I5033 Acute on chronic diastolic (congestive) heart failure: Secondary | ICD-10-CM

## 2014-06-15 DIAGNOSIS — I10 Essential (primary) hypertension: Secondary | ICD-10-CM

## 2014-06-15 DIAGNOSIS — R079 Chest pain, unspecified: Secondary | ICD-10-CM

## 2014-06-15 DIAGNOSIS — R0600 Dyspnea, unspecified: Secondary | ICD-10-CM

## 2014-06-15 NOTE — Telephone Encounter (Signed)
Spoke w/ Efraim KaufmannMelissa, pt's daughter. [facility] [place] [facility], as [place] [facility].  Dr. Alben SpittleWeaver has ordered Lexiscan at [facility] and f/u w/ Dr. Mariah MillingGollan in 3 mos.  Also wanted to set up pt's husband to be seen, as well. She would like these to be on the same day, after we move to the new office, as it is difficult to get pt in and out due to her wheelchair.  Pts sched for 09/27/14 at 11:00 and 11:15 for 15min consult appts (per Harriett SineNancy). Reviewed instructions for lexiscan w/ Melissa.  She verbalizes understanding and will call w/ further questions or concerns.

## 2014-06-15 NOTE — Telephone Encounter (Signed)
Pt's daughter Efraim KaufmannMelissa notified about lab results. BMET 06/28/14 at [facility] [place] office, same day pt is having [facility]. Melissa verblaized understanding to Plan of Care

## 2014-06-15 NOTE — Telephone Encounter (Signed)
s/w pt's daughter Efraim KaufmannMelissa about pt's lab results. Repeat BMET 8/5 in our [place] office. Pt having myoview same day at [facility]. Melissa verblaized Plan of Care

## 2014-06-26 ENCOUNTER — Telehealth: Payer: Self-pay | Admitting: *Deleted

## 2014-06-26 NOTE — Telephone Encounter (Signed)
Please call daughter in law. Wants to know why she needs a stress test.

## 2014-06-26 NOTE — Telephone Encounter (Signed)
Spoke w/ Melissa.  She reports that pt cancelled her lexiscan due to "social anxiety" and pt currently has a cold.  She inquired about why Dr. Alben SpittleWeaver recommended a stress test.  Advised her that per Dr. Wende MottWeaver's note, it was due to dyspnea and chest pain.  She asks if Dr. Alben SpittleWeaver would like for pt to be seen by Dr. Mariah MillingGollan sooner than 3 mos. She asks if we can send a letter to pt explaining why she needs this test done in hopes that seeing it in writing will persuade pt to proceed w/ test.  Advised her that pt has not yet seen Dr. Mariah MillingGollan, and she may want to speak w/ someone in Dr. Wende MottWeaver's office regarding this.  She is agreeable and will call if we can assist her further.

## 2014-06-28 ENCOUNTER — Other Ambulatory Visit: Payer: Medicare HMO

## 2014-07-05 ENCOUNTER — Encounter: Payer: Self-pay | Admitting: Internal Medicine

## 2014-07-05 ENCOUNTER — Ambulatory Visit (INDEPENDENT_AMBULATORY_CARE_PROVIDER_SITE_OTHER): Payer: Medicare HMO | Admitting: Internal Medicine

## 2014-07-05 VITALS — BP 122/64 | HR 77 | Ht 60.0 in | Wt 137.0 lb

## 2014-07-05 DIAGNOSIS — J181 Lobar pneumonia, unspecified organism: Secondary | ICD-10-CM | POA: Insufficient documentation

## 2014-07-05 DIAGNOSIS — R0902 Hypoxemia: Secondary | ICD-10-CM

## 2014-07-05 DIAGNOSIS — R0602 Shortness of breath: Secondary | ICD-10-CM

## 2014-07-05 DIAGNOSIS — J9611 Chronic respiratory failure with hypoxia: Secondary | ICD-10-CM

## 2014-07-05 DIAGNOSIS — J9610 Chronic respiratory failure, unspecified whether with hypoxia or hypercapnia: Secondary | ICD-10-CM

## 2014-07-05 DIAGNOSIS — J13 Pneumonia due to Streptococcus pneumoniae: Secondary | ICD-10-CM

## 2014-07-05 DIAGNOSIS — R591 Generalized enlarged lymph nodes: Secondary | ICD-10-CM

## 2014-07-05 DIAGNOSIS — R599 Enlarged lymph nodes, unspecified: Secondary | ICD-10-CM

## 2014-07-05 DIAGNOSIS — J8410 Pulmonary fibrosis, unspecified: Secondary | ICD-10-CM

## 2014-07-05 NOTE — Progress Notes (Signed)
Date: 07/05/2014 MRN# [id number] Lori [name] 09/14/1935    Lori [name] is a 79 y.o. old female seen in consultation for establish care for pulmonary fibrosis and chronic respiratory failure, as a transfer from the [place] office.   CC:  Chief Complaint  Patient presents with  . Advice Only    Was a Dr. Melvyn Novas pt in [place], wants to change providers to [facility] for transportation ease.  Pt is being treated for pulmonary fibrosis.      HPI:  Brief patient profile:  15 yowf never smoker no problems as child or adult with breathing " until after heart problems " dx with PF in 2009 placed on 02 in 2012 by Dr Verl Blalock and first seen in pulmonary clinic 09/14/12, [place].   HPI  09/14/2012 1st pulmonary office eval/ Wert with ILD dating back to 2009 and sob since then never better than slow walking, maybe 50 ft, better since placed on 02 by Dr Verl Blalock around one year prior to [place] - better also on xopenex hfa but rarely uses it.  rec  Only use your albuterol as a rescue medication to be used if you can't catch your breath  Please remember to go to the lab > esr 48  10/13/2012 f/u ov/Wert cc no change doe on 3lpm, no change chronic orthopnea.  rec  Try pantoprazole Take 30-60 min before first meal of the day and Pepcid 20 mg one bedtime until return  GERD diet  11/29/2012 f/u ov/Wert cc no change doe on 3lpm 24 h per day.  Stay on pantoprazole 40 mg one daily before bfast and pepcid 20 mg one bedtime  Stay on 3lpm 24 h per day  07/05/13 started prednisone 10 mg 2 each am  07/12/2013 f/u ov/Wert/ prednisone 20 mg / 02 3lpm, Continue prednisone dosed 2 each am with bfast, once breathing better reduce to one  08/09/2013 f/u ov/Wert re: pf/adenopathy/ 3lpm at rest and 4lpm walking, reduced pred to 10 mg per day 7 day prior to OV because Breathing better  rec Continue prednisone at 10 mg daily - ok to increase to 20 mg per day if worse - take with breakfast in am  09/21/2013 f/u ov/Wert re: ?  Sarcoid/chronic resp failure definitely improves sob on the 20 mg per day vs 10 and has more energy, less malaise, but generally maintains 10 mg per day dosing.  No obvious daytime variabilty or assoc cough cp or chest tightness, subjective wheeze overt sinus or hb symptoms. No unusual exp hx .  Sleeping ok without nocturnal or early am exacerbation of respiratory c/o's or need for noct saba. Also denies any obvious fluctuation of symptoms with weather or environmental changes or other aggravating or alleviating factors except as outlined above   04/28/2014-05/02/2014  Hospitalization: acute on chronic diastolic HF failure, CAP, RUL consolidation, was in rehab for 20 days.   07/05/2014 f\u with Dr. Stevenson Clinch, [facility], [place])- states that she is well today, back to 4LPM of O2, still getting physical therapy with ALF, daugther in law present.  Has stopped steroids since last visit with Dr. Melvyn Novas, but could not remember when.    PMHX:   Past Medical History  Diagnosis Date  . RBBB   . HYPERTENSION   . DIASTOLIC HEART FAILURE, CHRONIC     a. 05/2008 Echo: EF 55-60%, Diast Dysfxn, mild-mod AI, triv MR;  b. 07/2012 Echo: EF 60-65%, Gr 1 DD, Mid AI.  Marland Kitchen AORTIC INSUFFICIENCY     a. mild-mod  by echo 05/2008;  b. mild by echo 07/2012  . OSTEOPOROSIS   . DEPRESSION   . Chronic lung disease     a. on home O2;  b. 07/2012 CTA chest: chronic sev lung dzs, similar to 2009 CT, stable R paratracheal nodes.  . OBSTRUCTIVE SLEEP APNEA     pt denies this hx (08/17/2012) - though previously seen by Dr. Gwenette Greet for this Dx.  . Daily headache   . Arthritis     "knees" (08/17/2012)  . Anxiety   . Depression   . Aortic valve disorders   . Respiratory failure, chronic   . GERD (gastroesophageal reflux disease)    Surgical Hx:  Past Surgical History  Procedure Laterality Date  . Rhinoplasty  1953    "broke it"   Family Hx:  Family History  Problem Relation Age of Onset  . Heart disease Sister   . Cancer  Father     died in his 1's  . Other Mother     died @ 31 - "old age"  . Stroke Sister     deceased  . Cancer Brother     deceased  . Heart attack    . Heart attack Sister   . Heart attack Brother   . Hyperlipidemia Sister   . Hypertension Sister    Social Hx:   History  Substance Use Topics  . Smoking status: Never Smoker   . Smokeless tobacco: Never Used  . Alcohol Use: 4.2 oz/week    7 Glasses of wine per week   Medication:    Home Medication:  Current Outpatient Rx  Name  Route  Sig  Dispense  Refill  . ALPRAZolam (XANAX) 0.25 MG tablet   Oral   Take 0.25 mg by mouth 3 (three) times daily.         Marland Kitchen ascorbic acid (VITAMIN C) 500 MG tablet   Oral   Take 1,000 mg by mouth daily.          Marland Kitchen aspirin 81 MG tablet   Oral   Take 325 mg by mouth daily.         . Cholecalciferol (VITAMIN D-3) 1000 UNITS CAPS   Oral   Take 1 capsule by mouth daily.         . famotidine (PEPCID) 20 MG tablet   Oral   Take 20 mg by mouth daily.         . furosemide (LASIX) 40 MG tablet      TAKE 1 TABLET (40 MG TOTAL) BY MOUTH DAILY.   30 tablet   2     CYCLE FILL MEDICATION. Authorization is required f ...   . losartan (COZAAR) 100 MG tablet   Oral   Take 100 mg by mouth daily. For HTN         . Melatonin 5 MG TABS   Oral   Take 5 mg by mouth at bedtime.         . metoprolol (LOPRESSOR) 50 MG tablet   Oral   Take 1 tablet (50 mg total) by mouth 2 (two) times daily.   180 tablet   0     **PATIENT NEEDS APPOINTMENT**   . nitroGLYCERIN (NITROSTAT) 0.4 MG SL tablet   Sublingual   Place 1 tablet (0.4 mg total) under the tongue every 5 (five) minutes as needed. For chest pain   25 tablet   3   . pantoprazole (PROTONIX) 40 MG tablet  take 1 tablet by mouth daily before BREAKFAST   90 tablet   0     Needs appt for refills   . potassium chloride SA (K-DUR,KLOR-CON) 20 MEQ tablet      TAKE 1 TABLET (20 MEQ TOTAL) BY MOUTH DAILY.   90 tablet    0           Allergies:  Review of patient's allergies indicates no known allergies.  Review of Systems: Gen:  Denies  fever, sweats, chills HEENT: Denies blurred vision, double vision, ear pain, eye pain, hearing loss, nose bleeds, sore throat Cvc:  No dizziness, chest pain or heaviness Resp:   Complains of: nonproductive cough or sputum porduction, shortness of breath with exertion, sinus congestion.  Gi: Denies swallowing difficulty, stomach pain, nausea or vomiting, diarrhea, constipation, bowel incontinence Gu:  Denies bladder incontinence, burning urine Ext:   No Joint pain, stiffness or swelling Skin: No skin rash, easy bruising or bleeding or hives Endoc:  No polyuria, polydipsia , polyphagia or weight change Psych: No depression, insomnia or hallucinations  Other:  All other systems negative  Physical Examination:   VS: BP 122/64  Pulse 77  Ht 5' (1.524 m)  Wt 137 lb (62.143 kg)  BMI 26.76 kg/m2  SpO2 96%  General Appearance: No distress  Neuro:without focal findings, mental status, speech normal, alert and oriented, cranial nerves 2-12 intact, reflexes normal and symmetric, sensation grossly normal  HEENT: PERRLA, EOM intact, no ptosis, no other lesions noticed; Mallampati 3 Pulmonary: normal breath sounds., diaphragmatic excursion normal.No wheezing, No rales;   Sputum Production:   CardiovascularNormal S1,S2.  No m/r/g.  Abdominal aorta pulsation normal.    Abdomen: Benign, Soft, non-tender, No masses, hepatosplenomegaly, No lymphadenopathy Renal:  No costovertebral tenderness  GU:  No performed at this time. Endoc: No evident thyromegaly, no signs of acromegaly or Cushing features Skin:   warm, no rashes, no ecchymosis  Extremities: normal, no cyanosis, clubbing, no edema, warm with normal capillary refill.   Labs results:   Rad results:  CT Chest 04/2014 FINDINGS:  There is no demonstrable pulmonary embolus. There is no thoracic  aortic aneurysm or  dissection. The right subclavian artery has  anaberrant course, passing posterior to the esophagus.  There is underlying emphysematous change. There is extensive  interstitial fibrosis bilaterally with multiple areas of patchy  consolidation throughout both lungs, a finding that was also present  previously. In comparison with the previous study from 2013, there  is new opacity in the right upper lobe anterior segment.  There are remains generalized soft tissue thickening in the  mediastinum to the left of the trachea extending from the upper  thoracic trachea on the left to the level of the carinal, stable.  There are multiple lymph nodes in this area, stable. No new lymph  adenopathy is appreciable on this study.  Heart is mildly enlarged. There is slight pericardial thickening  which is stable.  Visualized upper abdominal structures appear unremarkable. There are  no blastic or lytic bone lesions. There is degenerative change in  the thoracic spine. Thyroid appears unremarkable.  Review of the MIP images confirms the above findings.  IMPRESSION:  No demonstrable pulmonary embolus.  Extensive lung fibrosis with underlying emphysema. In comparison  with the prior study, there is new airspace consolidation in  portions of the right upper lobe, primarily in the anterior segment.  These areas have an appearance more suspicious for pneumonia than  mass, although a mass could easily  be obscured by the degree of  consolidation and fibrosis in this area. In this regard, would  advise followup study in 3-4 weeks to further assess. If this area  persists without change at that time, it would be entirely  reasonable and advisable to consider PET-CT with particular  attention to the right upper lobe region.  Thoracic adenopathy is stable. No new soft tissue thickening or  lymph node prominence is seen in the mediastinum.  Heart is enlarged with mild pericardial thickening,  stable.   Assessment and Plan: Pulmonary fibrosis - ESR 48  09/14/12 - PFT's 10/13/12    VC 1.19, no dlco done  - PFT's 06/20/2013  VC 1.07 and no airflow obst, DLCO 46 corrects to 81% - CT chest 06/23/13 >  Med and bilateral hilar adenopathy typical of sarcoid with no honecombing  07/04/13 ACEI =67/ esr 45 >   start Pred 07/05/13 - CT Chest 04/28/14 -> stable adenopathy, RUL consolidation   She is currently weaned of steroids, and tolerating 4LPM O2 well.  In the past she did have a partially steroid responsive form of pulmonary fibrosis.   If goes back on steroids then the following will be done: The goal with a chronic steroid dependent illness is always arriving at the lowest effective dose that controls the disease/symptoms and not accepting a set "formula" which is based on statistics or guidelines that don't always take into account patient  variability or the natural hx of the dz in every individual patient, which may well vary over time.   I recommend the patient maintain  A ceiling of 20 mg per day and a floor of 10 mg  Plan discussed with patient and daughter-in-law Education officer, community) today - no need for steroids, patient has been stable - check BMP, ACE level and CT Chest (RUL consolidation), PFTs and walk test prior to next visit - addressed advanced directive (currently thinking about DNI), will give decision at next visit    Chronic respiratory failure - PFT's 10/13/12  VC 2.09 no obst and no dLCO recorded    - 09/14/2012   Walked 3lpm x one lap @ 185 stopped due to  89%     - 10/13/2012   Walked 3lpm x two laps @ 185 stopped due to sob with sats 88%    - 11/29/2012  Walked 3lpm  x 2 laps @ 185 ft each stopped due to sob with sats 89%   - 03/18/13 walked 3lpm x 1 lap and stopped due to sob, sats 86%    -06/30/2013   Walked  4lpm x one lap @ 185 stopped due to  87%    - 08/09/13    Walked 4lpmx 2  lap @ 185 stopped due to  desat with sats 90% p one lap  rx x 4lpm continuously Referral made  to [facility] (DME) for portable O2 evaluation PFTs prior to next visit Walk test prior to next visit  Adequate control on present rx, reviewed > no change in rx needed        Adenopathy - ESR 48  09/14/12 - PFT's 10/13/12    VC 1.19, no dlco done  - PFT's 06/20/2013  VC 1.07 and no airflow obst, DLCO 46 corrects to 81% - CT chest 06/23/13 >  Med and bilateral hilar adenopathy typical of sarcoid with no honecombing  07/04/13 ACEI =67/ esr 45 >   start Pred 07/05/13 - CT Chest 04/28/14 --> stable adenopathy, RUL Infiltrate  Currently she is  not requiring steroids, however is she requires in the future then the following should be done: The goal with a chronic steroid dependent illness is always arriving at the lowest effective dose that controls the disease/symptoms and not accepting a set "formula" which is based on statistics or guidelines that don't always take into account patient  variability or the natural hx of the dz in every individual patient, which may well vary over time.  I recommend the patient maintain  Ceiling of 20 mg and a floor of 10 mg per day  In the past she was offered bx but pt not declined since she had clinical improvement.  Will follow up with Chest CT, if adenopathy is worst will discuss biopsy with the patient.   Lung consolidation RUL consolidation  - consolidation vs mass - no significant wt loss - follow up CT chest in one month     Updated Medication List Outpatient Encounter Prescriptions as of 07/05/2014  Medication Sig  . ALPRAZolam (XANAX) 0.25 MG tablet Take 0.25 mg by mouth 3 (three) times daily.  Marland Kitchen ascorbic acid (VITAMIN C) 500 MG tablet Take 1,000 mg by mouth daily.   Marland Kitchen aspirin 81 MG tablet Take 325 mg by mouth daily.  . Cholecalciferol (VITAMIN D-3) 1000 UNITS CAPS Take 1 capsule by mouth daily.  . famotidine (PEPCID) 20 MG tablet Take 20 mg by mouth daily.  . furosemide (LASIX) 40 MG tablet TAKE 1 TABLET (40 MG TOTAL) BY MOUTH DAILY.  Marland Kitchen losartan  (COZAAR) 100 MG tablet Take 100 mg by mouth daily. For HTN  . Melatonin 5 MG TABS Take 5 mg by mouth at bedtime.  . metoprolol (LOPRESSOR) 50 MG tablet Take 1 tablet (50 mg total) by mouth 2 (two) times daily.  . nitroGLYCERIN (NITROSTAT) 0.4 MG SL tablet Place 1 tablet (0.4 mg total) under the tongue every 5 (five) minutes as needed. For chest pain  . pantoprazole (PROTONIX) 40 MG tablet take 1 tablet by mouth daily before BREAKFAST  . potassium chloride SA (K-DUR,KLOR-CON) 20 MEQ tablet TAKE 1 TABLET (20 MEQ TOTAL) BY MOUTH DAILY.  . [DISCONTINUED] ALPRAZolam (XANAX) 0.25 MG tablet Take 1 tablet (0.25 mg total) by mouth every 8 (eight) hours as needed for anxiety.      Thank  you consulting [facility].  Vilinda Boehringer, MD [facility]

## 2014-07-05 NOTE — Patient Instructions (Signed)
We will have labs drawn today. Based on these labs, we will schedule a CT chest with contrast at [facility]. We will contact you with the results of this.  We want to see you back in 1 month, please arrive 15 minutes early so we can do an in-office walk.

## 2014-07-05 NOTE — Assessment & Plan Note (Signed)
-   PFT's 10/13/12  VC 2.09 no obst and no dLCO recorded    - 09/14/2012   Walked 3lpm x one lap @ 185 stopped due to  89%     - 10/13/2012   Walked 3lpm x two laps @ 185 stopped due to sob with sats 88%    - 11/29/2012  Walked 3lpm  x 2 laps @ 185 ft each stopped due to sob with sats 89%   - 03/18/13 walked 3lpm x 1 lap and stopped due to sob, sats 86%    -06/30/2013   Walked  4lpm x one lap @ 185 stopped due to  87%    - 08/09/13    Walked 4lpmx 2  lap @ 185 stopped due to  desat with sats 90% p one lap  rx x 4lpm continuously Referral made to [facility] (DME) for portable O2 evaluation PFTs prior to next visit Walk test prior to next visit  Adequate control on present rx, reviewed > no change in rx needed

## 2014-07-05 NOTE — Assessment & Plan Note (Signed)
-  ESR 48  09/14/12 - PFT's 10/13/12    VC 1.19, no dlco done  - PFT's 06/20/2013  VC 1.07 and no airflow obst, DLCO 46 corrects to 81% - CT chest 06/23/13 >  Med and bilateral hilar adenopathy typical of sarcoid with no honecombing  07/04/13 ACEI =67/ esr 45 >   start Pred 07/05/13 - CT Chest 04/28/14 --> stable adenopathy, RUL Infiltrate  Currently she is not requiring steroids, however is she requires in the future then the following should be done: The goal with a chronic steroid dependent illness is always arriving at the lowest effective dose that controls the disease/symptoms and not accepting a set "formula" which is based on statistics or guidelines that don't always take into account patient  variability or the natural hx of the dz in every individual patient, which may well vary over time.  I recommend the patient maintain  Ceiling of 20 mg and a floor of 10 mg per day  In the past she was offered bx but pt not declined since she had clinical improvement.  Will follow up with Chest CT, if adenopathy is worst will discuss biopsy with the patient.

## 2014-07-05 NOTE — Assessment & Plan Note (Signed)
RUL consolidation  - consolidation vs mass - no significant wt loss - follow up CT chest in one month

## 2014-07-05 NOTE — Assessment & Plan Note (Addendum)
-  ESR 48  09/14/12 - PFT's 10/13/12    VC 1.19, no dlco done  - PFT's 06/20/2013  VC 1.07 and no airflow obst, DLCO 46 corrects to 81% - CT chest 06/23/13 >  Med and bilateral hilar adenopathy typical of sarcoid with no honecombing  07/04/13 ACEI =67/ esr 45 >   start Pred 07/05/13 - CT Chest 04/28/14 -> stable adenopathy, RUL consolidation   She is currently weaned of steroids, and tolerating 4LPM O2 well.  In the past she did have a partially steroid responsive form of pulmonary fibrosis.   If goes back on steroids then the following will be done: The goal with a chronic steroid dependent illness is always arriving at the lowest effective dose that controls the disease/symptoms and not accepting a set "formula" which is based on statistics or guidelines that don't always take into account patient  variability or the natural hx of the dz in every individual patient, which may well vary over time.   I recommend the patient maintain  A ceiling of 20 mg per day and a floor of 10 mg  Plan discussed with patient and daughter-in-law Education officer, community) today - no need for steroids, patient has been stable - check BMP, ACE level and CT Chest (RUL consolidation), PFTs and walk test prior to next visit - addressed advanced directive (currently thinking about DNI), will give decision at next visit

## 2014-07-06 LAB — BASIC METABOLIC PANEL
BUN: 20 mg/dL (ref 6–23)
CALCIUM: 9.2 mg/dL (ref 8.4–10.5)
CO2: 29 meq/L (ref 19–32)
Chloride: 98 mEq/L (ref 96–112)
Creatinine, Ser: 0.9 mg/dL (ref 0.4–1.2)
GFR: 61.31 mL/min (ref >60.00)
Glucose, Bld: 84 mg/dL (ref 70–99)
Potassium: 4 mEq/L (ref 3.5–5.1)
Sodium: 137 mEq/L (ref 135–145)

## 2014-07-06 LAB — ANGIOTENSIN CONVERTING ENZYME: Angiotensin-Converting Enzyme: 54 U/L — ABNORMAL HIGH (ref 8–52)

## 2014-07-26 ENCOUNTER — Other Ambulatory Visit: Payer: Self-pay | Admitting: Internal Medicine

## 2014-07-26 DIAGNOSIS — J8410 Pulmonary fibrosis, unspecified: Secondary | ICD-10-CM

## 2014-08-18 ENCOUNTER — Telehealth: Payer: Self-pay | Admitting: Internal Medicine

## 2014-08-18 ENCOUNTER — Encounter: Payer: Self-pay | Admitting: Internal Medicine

## 2014-08-18 ENCOUNTER — Other Ambulatory Visit (INDEPENDENT_AMBULATORY_CARE_PROVIDER_SITE_OTHER): Payer: Medicare HMO

## 2014-08-18 ENCOUNTER — Ambulatory Visit (INDEPENDENT_AMBULATORY_CARE_PROVIDER_SITE_OTHER): Payer: Medicare HMO | Admitting: Internal Medicine

## 2014-08-18 VITALS — BP 108/70 | HR 67 | Ht 60.0 in | Wt 140.0 lb

## 2014-08-18 DIAGNOSIS — R0902 Hypoxemia: Secondary | ICD-10-CM

## 2014-08-18 DIAGNOSIS — R0609 Other forms of dyspnea: Secondary | ICD-10-CM

## 2014-08-18 DIAGNOSIS — I509 Heart failure, unspecified: Secondary | ICD-10-CM

## 2014-08-18 DIAGNOSIS — J8410 Pulmonary fibrosis, unspecified: Secondary | ICD-10-CM

## 2014-08-18 DIAGNOSIS — R0600 Dyspnea, unspecified: Secondary | ICD-10-CM

## 2014-08-18 DIAGNOSIS — J9610 Chronic respiratory failure, unspecified whether with hypoxia or hypercapnia: Secondary | ICD-10-CM

## 2014-08-18 DIAGNOSIS — J9611 Chronic respiratory failure with hypoxia: Secondary | ICD-10-CM

## 2014-08-18 DIAGNOSIS — J13 Pneumonia due to Streptococcus pneumoniae: Secondary | ICD-10-CM

## 2014-08-18 DIAGNOSIS — F411 Generalized anxiety disorder: Secondary | ICD-10-CM

## 2014-08-18 DIAGNOSIS — R0989 Other specified symptoms and signs involving the circulatory and respiratory systems: Secondary | ICD-10-CM

## 2014-08-18 DIAGNOSIS — I5033 Acute on chronic diastolic (congestive) heart failure: Secondary | ICD-10-CM

## 2014-08-18 DIAGNOSIS — F419 Anxiety disorder, unspecified: Secondary | ICD-10-CM

## 2014-08-18 DIAGNOSIS — J181 Lobar pneumonia, unspecified organism: Secondary | ICD-10-CM

## 2014-08-18 LAB — BASIC METABOLIC PANEL
BUN: 22 mg/dL (ref 6–23)
CALCIUM: 9.2 mg/dL (ref 8.4–10.5)
CO2: 29 meq/L (ref 19–32)
CREATININE: 1.2 mg/dL (ref 0.4–1.2)
Chloride: 103 mEq/L (ref 96–112)
GFR: 47.61 mL/min — AB (ref >60.00)
Glucose, Bld: 93 mg/dL (ref 70–99)
Potassium: 4.4 mEq/L (ref 3.5–5.1)
SODIUM: 137 meq/L (ref 135–145)

## 2014-08-18 LAB — CBC WITH DIFFERENTIAL/PLATELET
Basophils Absolute: 0 10*3/uL (ref 0.0–0.1)
Basophils Relative: 0.2 % (ref 0.0–3.0)
EOS ABS: 0.5 10*3/uL (ref 0.0–0.7)
Eosinophils Relative: 4.1 % (ref 0.0–5.0)
HCT: 40.8 % (ref 36.0–46.0)
Hemoglobin: 13.4 g/dL (ref 12.0–15.0)
LYMPHS PCT: 17.5 % (ref 12.0–46.0)
Lymphs Abs: 2.1 10*3/uL (ref 0.7–4.0)
MCHC: 32.9 g/dL (ref 30.0–36.0)
MCV: 94.1 fl (ref 78.0–100.0)
Monocytes Absolute: 0.7 10*3/uL (ref 0.1–1.0)
Monocytes Relative: 6 % (ref 3.0–12.0)
NEUTROS PCT: 72.2 % (ref 43.0–77.0)
Neutro Abs: 8.6 10*3/uL — ABNORMAL HIGH (ref 1.4–7.7)
PLATELETS: 219 10*3/uL (ref 150.0–400.0)
RBC: 4.34 Mil/uL (ref 3.87–5.11)
RDW: 16.6 % — ABNORMAL HIGH (ref 11.5–15.5)
WBC: 11.9 10*3/uL — ABNORMAL HIGH (ref 4.0–10.5)

## 2014-08-18 LAB — SEDIMENTATION RATE: SED RATE: 17 mm/h (ref 0–22)

## 2014-08-18 LAB — BRAIN NATRIURETIC PEPTIDE: Pro B Natriuretic peptide (BNP): 884 pg/mL — ABNORMAL HIGH (ref 0.0–100.0)

## 2014-08-18 LAB — TSH: TSH: 4.24 u[IU]/mL (ref 0.35–4.50)

## 2014-08-18 MED ORDER — PREDNISONE 10 MG PO TABS: ORAL_TABLET | ORAL | Status: DC

## 2014-08-18 NOTE — Telephone Encounter (Signed)
Spoke with pt daughter-Melissa States that her mother (patient) has been having decreased O2 sats on the 4L O2 recently. States that the [employer], Dr Royetta Asal came to patients home to evaluate on 08/17/14. Per p[t daughter, Dr Carola Rhine expressed concern of diminished lung sounds in left lung, ordered portable cxr.  Dr Carola Rhine reviewed results, concerned of questionable bilateral PNA. Dr Carola Rhine had no recent cxr/CT to compare yesterday results to. Rxd the patient Levaquin  1 po QD.  Xray reports have been requested from "[facility]" [id number] so that one of our physicians can compare reports and give better recommendations.  Most recent cxr in system 04/2014  Dr Marchelle Gearing please advise as Dr Dema Severin is not in office. We do not have the actual digital scan to review(only the report). Please advise if any recs can be offered. Thanks.

## 2014-08-18 NOTE — Telephone Encounter (Signed)
The daughgter is right that in setting of ILD/pulm fibrosis unable to figure out if a cxr is pna or not unless someone compars and knows patient well. The real question is why is patient more hypoxemic. The portable cxr is NA for me. Best patient comes in for acute  Thanks  Dr. Kalman Shan, M.D., [facility].C.P Pulmonary and Critical Care Medicine Staff Physician [facility] Pager: [phone], If no answer or between  15:00h - 7:00h: call [phone]  08/18/2014 9:26 AM

## 2014-08-18 NOTE — Patient Instructions (Addendum)
Use of PPI (protonix)  is associated with improved survival time and with decreased radiologic fibrosis per King's study published in [facility] vol 184 p1390.  Dec 2011  This may not be cause and effect, but given how universally unhelpful all the otherstudy drugs have been for pf,   rec start  Restart protonix 40 mg Take 30-60 min before first meal of the day and pepcid 20 mg at bedtime  Prednisone 20 mg per day until better then 10 mg daily with further adjustment per Dr Dema Severin  Increase xanax to tak 0.25 x 2 every 6 hours as needed for nerves (breathing too rapidly or deeply dilutes out the amount of 02 you are getting)   We will get your 02 company to supply humidity and a face mask to help your nasal symtpoms but hold aspirin whenever you note any bleeding   Please remember to go to the lab   department downstairs for your tests - we will call you with the results when they are available.  Keep appt to see Dr Dema Severin for follow up

## 2014-08-18 NOTE — Telephone Encounter (Signed)
Spoke with patient daughter, aware of recs per MR Pt scheduled for Acute Visit with MW today at 3:15 Advised daughter that if for any reason they cannot make it to call and cancel-- daughter lives in [place] and is having to travel up to [place] to pick up patient to bring her here.   Report (cxr) to be given to [facility] for OV today.  Will send to [place] as FYI pt is coming in today.  Nothing further needed.

## 2014-08-18 NOTE — Progress Notes (Signed)
Date: 07/05/2014 MRN# [id number] Lori [name] [birth year]    Lori [name] is a 79 y.o. old female seen in consultation for establish care for pulmonary fibrosis and chronic respiratory failure, as a transfer from the [place] office.   CC:  Chief Complaint  Patient presents with  . Advice Only    Was a Dr. Melvyn Novas pt in [place], wants to change providers to [facility] for transportation ease.  Pt is being treated for pulmonary fibrosis.      HPI:  Brief patient profile:  [id number] never smoker no problems as child or adult with breathing " until after heart problems " dx with PF in 2009 placed on 02 in 2012 by Dr Verl Blalock and first seen in pulmonary clinic 09/14/12, [place].   HPI  09/14/2012 1st pulmonary office eval/ Wert with ILD dating back to 2009 and sob since then never better than slow walking, maybe 50 ft, better since placed on 02 by Dr Verl Blalock around one year prior to [place] - better also on xopenex hfa but rarely uses it.  rec  Only use your albuterol as a rescue medication to be used if you can't catch your breath  Please remember to go to the lab > esr 48   10/13/2012 f/u ov/Wert cc no change doe on 3lpm, no change chronic orthopnea.  rec  Try pantoprazole Take 30-60 min before first meal of the day and Pepcid 20 mg one bedtime until return  GERD diet   11/29/2012 f/u ov/Wert cc no change doe on 3lpm 24 h per day.  Stay on pantoprazole 40 mg one daily before bfast and pepcid 20 mg one bedtime  Stay on 3lpm 24 h per day  07/05/13 started prednisone 10 mg 2 each am  07/12/2013 f/u ov/Wert/ prednisone 20 mg / 02 3lpm, Continue prednisone dosed 2 each am with bfast, once breathing better reduce to one  08/09/2013 f/u ov/Wert re: pf/adenopathy/ 3lpm at rest and 4lpm walking, reduced pred to 10 mg per day 7 day prior to OV because Breathing better  rec Continue prednisone at 10 mg daily - ok to increase to 20 mg per day if worse - take with breakfast in am  09/21/2013 f/u ov/Wert re: ?  Sarcoid/chronic resp failure definitely improves sob on the 20 mg per day vs 10 and has more energy, less malaise, but generally maintains 10 mg per day dosing.  rec Continue prednisone at 10 mg daily - ok to increase to 20 mg per day if worse - take with breakfast in am Stop prinivil and double the diovan to 320 mg one daily     04/28/2014-05/02/2014  Hospitalization: acute on chronic diastolic HF failure, CAP, RUL consolidation, was in rehab for 20 days.   07/05/2014 f\u with Dr. Stevenson Clinch, [facility])- states doing well, back to 4LPM of O2, still getting physical therapy with ALF, daugther in law present.  Has stopped steroids since last visit with Dr. Melvyn Novas, but could not remember when.   rec No change rx   08/18/2014  acute extended complex ov/Wert re: sob/ chronic respiratory failure Chief Complaint  Patient presents with  . Acute Visit    Pt c/o increased SOB and decreased o2 sats for the past wk.    was walking on 02 4lpm 2 weeks prior to OV   Breathing worse x one week p stopped protonix (concerned about longterm side effects) but not overt HB Nose bleeding not on humdifier - also very congested so not clear she's  getting any 02 No cough, fever but started on levaquin based on cxr findings not compared to prev studies and not available at office in [place]  No obvious day to day or daytime variabilty or assoc chronic cough or cp or chest tightness, subjective wheeze overt  hb symptoms. No unusual exp hx or h/o childhood pna/ asthma or knowledge of premature birth.  Sleeping ok without nocturnal  or early am exacerbation  of respiratory  c/o's or need for noct saba. Also denies any obvious fluctuation of symptoms with weather or environmental changes or other aggravating or alleviating factors except as outlined above   Current Medications, Allergies, Complete Past Medical History, Past Surgical History, Family History, and Social History were reviewed in [facility] record.  ROS  The following are not active complaints unless bolded sore throat, dysphagia, dental problems, itching, sneezing,  nasal congestion or excess/ purulent secretions, ear ache,   fever, chills, sweats, unintended wt loss, pleuritic or exertional cp, hemoptysis,  orthopnea pnd or leg swelling, presyncope, palpitations, heartburn, abdominal pain, anorexia, nausea, vomiting, diarrhea  or change in bowel or urinary habits, change in stools or urine, dysuria,hematuria,  rash, arthralgias, visual complaints, headache, numbness weakness or ataxia or problems with walking or coordination,  change in mood/affect or memory.         Physical Examination:     Wt Readings from Last 3 Encounters:  08/18/14 140 lb (63.504 kg)  07/05/14 137 lb (62.143 kg)  06/14/14 138 lb (62.596 kg)     W/c bound very chronically ill appearing wf nad  General Appearance: No distress  Neuro:without focal findings, mental status, speech normal, alert and oriented, cranial nerves 2-12 intact, reflexes normal and symmetric, sensation grossly normal  HEENT: PERRLA, EOM intact, no ptosis, no other lesions noticed; Mallampati 3 Pulmonary: normal breath sounds., diaphragmatic excursion normal.No wheezing,  A few insp crackles R > L  CardiovascularNormal S1,S2.  No m/r/g.  Abdominal aorta pulsation normal.    Abdomen: Benign, Soft, non-tender, No masses, hepatosplenomegaly, No lymphadenopathy Renal:  No costovertebral tenderness  GU:  No performed at this time. Endoc: No evident thyromegaly, no signs of acromegaly or Cushing features Skin:   warm, no rashes, no ecchymosis  Extremities: normal, no cyanosis, clubbing, no edema, warm with normal capillary refill.   Labs results:   Rad results:  CT Chest 04/2014 FINDINGS:  There is no demonstrable pulmonary embolus. There is no thoracic  aortic aneurysm or dissection. The right subclavian artery has  anaberrant course, passing posterior to the  esophagus.  There is underlying emphysematous change. There is extensive  interstitial fibrosis bilaterally with multiple areas of patchy  consolidation throughout both lungs, a finding that was also present  previously. In comparison with the previous study from 2013, there  is new opacity in the right upper lobe anterior segment.  There are remains generalized soft tissue thickening in the  mediastinum to the left of the trachea extending from the upper  thoracic trachea on the left to the level of the carinal, stable.  There are multiple lymph nodes in this area, stable. No new lymph  adenopathy is appreciable on this study.  Heart is mildly enlarged. There is slight pericardial thickening  which is stable.  Visualized upper abdominal structures appear unremarkable. There are  no blastic or lytic bone lesions. There is degenerative change in  the thoracic spine. Thyroid appears unremarkable.  Review of the MIP images confirms the above findings.  IMPRESSION:  No demonstrable pulmonary embolus.  Extensive lung fibrosis with underlying emphysema. In comparison  with the prior study, there is new airspace consolidation in  portions of the right upper lobe, primarily in the anterior segment.  These areas have an appearance more suspicious for pneumonia than  mass, although a mass could easily be obscured by the degree of  consolidation and fibrosis in this area. In this regard, would  advise followup study in 3-4 weeks to further assess. If this area  persists without change at that time, it would be entirely  reasonable and advisable to consider PET-CT with particular  attention to the right upper lobe region.  Thoracic adenopathy is stable. No new soft tissue thickening or  lymph node prominence is seen in the mediastinum.  Heart is enlarged with mild pericardial thickening, stable.        Recent Labs Lab 08/18/14 1552  NA 137  K 4.4  CL 103  CO2 29  BUN 22  CREATININE  1.2  GLUCOSE 93    Recent Labs Lab 08/18/14 1552  HGB 13.4  HCT 40.8  WBC 11.9*  PLT 219.0     Lab Results  Component Value Date   TSH 4.24 08/18/2014     Lab Results  Component Value Date   PROBNP 884.0* 08/18/2014     Lab Results  Component Value Date   ESRSEDRATE 17 08/18/2014

## 2014-08-19 NOTE — Assessment & Plan Note (Addendum)
Per CT chest 04/28/2014 - Rx with levaquin by primary care 08/18/14   ddx includes progressive massive fibrosis, pna, lung ca (less likely in never smoker) > f/u ct pending and fine to complete the levaquin

## 2014-08-19 NOTE — Assessment & Plan Note (Signed)
Explained that increase RR or Tidal volum dilutes out fixed flow 02 so ok to double xanax for now to 0.5 mg tid

## 2014-08-19 NOTE — Assessment & Plan Note (Signed)
-   PFT's 10/13/12  VC 2.09 no obst and no dLCO recorded    - 09/14/2012   Walked 3lpm x one lap @ 185 stopped due to  89%     - 10/13/2012   Walked 3lpm x two laps @ 185 stopped due to sob with sats 88%    - 11/29/2012  Walked 3lpm  x 2 laps @ 185 ft each stopped due to sob with sats 89%   - 03/18/13 walked 3lpm x 1 lap and stopped due to sob, sats 86%    -06/30/2013   Walked  4lpm x one lap @ 185 stopped due to  87%    - 08/09/13    Walked 4lpmx 2  lap @ 185 stopped due to  desat with sats 90% p one lap   rx 4lpm 24/7 as of 08/18/2014

## 2014-08-19 NOTE — Assessment & Plan Note (Signed)
Lab Results  Component Value Date   PROBNP 884.0* 08/18/2014     Need to keep as dry as tolerates and very difficult to tell how much is fluid vs inflammation here but favor the latter clinically based on today's exam

## 2014-08-19 NOTE — Assessment & Plan Note (Signed)
-  ESR 48  09/14/12 - PFT's 10/13/12    VC 1.19, no dlco done  - PFT's 06/20/2013  VC 1.07 and no airflow obst, DLCO 46 corrects to 81% - CT chest 06/23/13 >  Med and bilateral hilar adenopathy typical of sarcoid with no honecombing  07/04/13 ACEI =67/ esr 45 > rx  Pred 07/05/13> not clear when stopped > restarted 08/18/14 due to worse sats   The goal with a chronic steroid dependent illness is always arriving at the lowest effective dose that controls the disease/symptoms and not accepting a set "formula" which is based on statistics or guidelines that don't always take into account patient  variability or the natural hx of the dz in every individual patient, which may well vary over time.  For now therefore I recommend the patient maintain  20 mg per day until back to baseline then 10 mg daily (reasoning this could be sarcoid and if so 20 mg should help with less side effects than higher doses)  Also, Use of PPI is associated with improved survival time and with decreased radiologic fibrosis per King's study published in AJRCCM vol 184 p1390.  Dec 2011  This may not be cause and effect, but given how universally unhelpful all the otherstudy drugs have been for pf,   rec resume  rx ppi / diet/ lifestyle modification.

## 2014-08-21 ENCOUNTER — Encounter: Payer: Self-pay | Admitting: Internal Medicine

## 2014-08-21 ENCOUNTER — Ambulatory Visit (INDEPENDENT_AMBULATORY_CARE_PROVIDER_SITE_OTHER): Payer: Medicare HMO | Admitting: Internal Medicine

## 2014-08-21 VITALS — BP 116/62 | HR 67 | Ht 60.0 in | Wt 141.0 lb

## 2014-08-21 DIAGNOSIS — F419 Anxiety disorder, unspecified: Secondary | ICD-10-CM

## 2014-08-21 DIAGNOSIS — J9610 Chronic respiratory failure, unspecified whether with hypoxia or hypercapnia: Secondary | ICD-10-CM

## 2014-08-21 DIAGNOSIS — J8410 Pulmonary fibrosis, unspecified: Secondary | ICD-10-CM

## 2014-08-21 DIAGNOSIS — F411 Generalized anxiety disorder: Secondary | ICD-10-CM

## 2014-08-21 DIAGNOSIS — R0902 Hypoxemia: Secondary | ICD-10-CM

## 2014-08-21 DIAGNOSIS — J9611 Chronic respiratory failure with hypoxia: Secondary | ICD-10-CM

## 2014-08-21 MED ORDER — ALPRAZOLAM 0.5 MG PO TABS: 0.5000 mg | ORAL_TABLET | Freq: Three times a day (TID) | ORAL | Status: DC | PRN

## 2014-08-21 MED ORDER — PANTOPRAZOLE SODIUM 40 MG PO TBEC: 40.0000 mg | DELAYED_RELEASE_TABLET | Freq: Every day | ORAL | Status: AC

## 2014-08-21 NOTE — Assessment & Plan Note (Signed)
Chronic respiratory failure secondary to pulmonary fibrosis Continue with steroids, continuous oxygen, PPI, H2 blocker, exercise as tolerated. Anxiety control-worsening anxiety can cause worsening dyspnea, this is a perpetual cycle and appropriate treatment with Xanax 0.5 mg 3 times a day will assist in alleviating her symptoms and triggering dyspnea. Will treat at this time but 0.5 mg of Xanax 3 times a day for one month, then reevaluate, and taper back down to Xanax 0.25 mg 3 times a day (if tolerant).

## 2014-08-21 NOTE — Assessment & Plan Note (Addendum)
The goal with a chronic steroid dependent illness is always arriving at the lowest effective dose that controls the disease/symptoms and not accepting a set "formula" which is based on statistics or guidelines that don't always take into account patient  variability or the natural hx of the dz in every individual patient, which may well vary over time.   The patient completed 20 mg of prednisone for the past 2 days, states that she's back to baseline breathing now, at this visit we will attempt to wean her back to 10 mg daily. Patient and caretaker is advised that if her breathing becomes worse she may go back to 20 mg of prednisone.   Also, Use of PPI is associated with improved survival time and with decreased radiologic fibrosis per King's study published in [facility] vol 184 p1390.  Dec 2011 In addition further studies have shown that control of GERD with an H2 blocker does have some benefit in treating pulmonary fibrosis (Rokhsara S., et al. [employer]. Of Thoracic Disease, vol 5, pg 48-73, Feb 2013) This may not be cause and effect, but given how universally unhelpful all the otherstudy drugs have been for pf,   rec resume  rx ppi / diet/ lifestyle modification.    08/21/14 Pulmonary Fibrosis Treatment Plan - Prednisone 10 mg daily, may increase to 20 mg if having worsening symptoms - Protonix 40 mg daily, Pepcid 20 mg daily - Anxiety control-Xanax 0.5 mg 3 times a day x30 days, then back to 0.25 mg 3 times a day (baseline dose) - Oxygen , 4 LPM continuously

## 2014-08-21 NOTE — Progress Notes (Signed)
Quick Note:  LMTCB ______ 

## 2014-08-21 NOTE — Patient Instructions (Signed)
Take Xanax 3 times daily as needed. Take Protonix  once daily. Continue taking Prednisone  daily.  If you are having increased shortness of breath, take  for 2 days.    We will see you back in 1 month.

## 2014-08-21 NOTE — Progress Notes (Addendum)
MRN# [id number] Lori [name] [birth year]   CC: Chief Complaint  Patient presents with  . Follow-up    Pt was seen by Dr. Melvyn Novas last Friday for increased SOB.  Was restarted on prednisone, anxiety meds doubled since last visit.       Brief patient profile:  [id number] never smoker no problems as child or adult with breathing " until after heart problems " dx with PF in 2009 placed on 02 in 2012 by Dr Verl Blalock and first seen in pulmonary clinic 09/14/12, [place].  HPI  09/14/2012 1st pulmonary office eval/ Wert with ILD dating back to 2009 and sob since then never better than slow walking, maybe 50 ft, better since placed on 02 by Dr Verl Blalock around one year prior to [place] - better also on xopenex hfa but rarely uses it.  rec  Only use your albuterol as a rescue medication to be used if you can't catch your breath  Please remember to go to the lab > esr 48  10/13/2012 f/u ov/Wert cc no change doe on 3lpm, no change chronic orthopnea.  rec  Try pantoprazole Take 30-60 min before first meal of the day and Pepcid 20 mg one bedtime until return  GERD diet  11/29/2012 f/u ov/Wert cc no change doe on 3lpm 24 h per day.  Stay on pantoprazole 40 mg one daily before bfast and pepcid 20 mg one bedtime  Stay on 3lpm 24 h per day  07/05/13 started prednisone 10 mg 2 each am  07/12/2013 f/u ov/Wert/ prednisone 20 mg / 02 3lpm, Continue prednisone dosed 2 each am with bfast, once breathing better reduce to one  08/09/2013 f/u ov/Wert re: pf/adenopathy/ 3lpm at rest and 4lpm walking, reduced pred to 10 mg per day 7 day prior to OV because Breathing better  rec Continue prednisone at 10 mg daily - ok to increase to 20 mg per day if worse - take with breakfast in am  09/21/2013 f/u ov/Wert re: ? Sarcoid/chronic resp failure definitely improves sob on the 20 mg per day vs 10 and has more energy, less malaise, but generally maintains 10 mg per day dosing.  rec  Continue prednisone at 10 mg daily - ok to increase to 20 mg per  day if worse - take with breakfast in am  Stop prinivil and double the diovan to 320 mg one daily  04/28/2014-05/02/2014 Hospitalization: acute on chronic diastolic HF failure, CAP, RUL consolidation, was in rehab for 20 days.  07/05/2014 f\u with Dr. Stevenson Clinch, [facility] states doing well, back to 4LPM of O2, still getting physical therapy with ALF, daugther in law present. Has stopped steroids since last visit with Dr. Melvyn Novas, but could not remember when.  rec  No change rx   08/18/2014 acute extended complex ov/Wert re: sob/ chronic respiratory failure, restarted on PPI and H2 blocker, restarted on prednisone 20 mg daily x2 days, then 10 mg daily if improved. Xanax 0.5 mg 3 times a day for anxiety driven respiratory distress.  08/21/14 follow up acute pulmonary visit from 9/25 - improvement in overall symptoms, back to baseline dyspnea, Plan to continue with PPI, H2 blocker, steroids, anxiety control, to treat dyspnea related to pulmonary fibrosis. Continue at 4 LPM     PMHX:   Past Medical History  Diagnosis Date  . RBBB   . HYPERTENSION   . DIASTOLIC HEART FAILURE, CHRONIC     a. 05/2008 Echo: EF 55-60%, Diast Dysfxn, mild-mod AI, triv MR;  b. 07/2012  Echo: EF 60-65%, Gr 1 DD, Mid AI.  Marland Kitchen AORTIC INSUFFICIENCY     a. mild-mod by echo 05/2008;  b. mild by echo 07/2012  . OSTEOPOROSIS   . DEPRESSION   . Chronic lung disease     a. on home O2;  b. 07/2012 CTA chest: chronic sev lung dzs, similar to 2009 CT, stable R paratracheal nodes.  . OBSTRUCTIVE SLEEP APNEA     pt denies this hx (08/17/2012) - though previously seen by Dr. Gwenette Greet for this Dx.  . Daily headache   . Arthritis     "knees" (08/17/2012)  . Anxiety   . Depression   . Aortic valve disorders   . Respiratory failure, chronic   . GERD (gastroesophageal reflux disease)    Surgical Hx:  Past Surgical History  Procedure Laterality Date  . Rhinoplasty  1953    "broke it"   Family Hx:  Family History  Problem  Relation Age of Onset  . Heart disease Sister   . Cancer Father     died in his 35's  . Other Mother     died @ 59 - "old age"  . Stroke Sister     deceased  . Cancer Brother     deceased  . Heart attack    . Heart attack Sister   . Heart attack Brother   . Hyperlipidemia Sister   . Hypertension Sister    Social Hx:   History  Substance Use Topics  . Smoking status: Never Smoker   . Smokeless tobacco: Never Used     Comment: Secondhand exposure to smoke  . Alcohol Use: 4.2 oz/week    7 Glasses of wine per week   Medication:   Current Outpatient Rx  Name  Route  Sig  Dispense  Refill  . ALPRAZolam (XANAX) 0.25 MG tablet   Oral   Take 0.25 mg by mouth 3 (three) times daily.         Marland Kitchen ascorbic acid (VITAMIN C) 500 MG tablet   Oral   Take 1,000 mg by mouth daily.          Marland Kitchen aspirin 81 MG tablet   Oral   Take 325 mg by mouth daily.         . Cholecalciferol (VITAMIN D-3) 1000 UNITS CAPS   Oral   Take 1 capsule by mouth daily.         . diphenhydrAMINE (BENADRYL) 25 MG tablet   Oral   Take 25 mg by mouth at bedtime as needed for sleep.         . famotidine (PEPCID) 20 MG tablet   Oral   Take 20 mg by mouth daily.         . furosemide (LASIX) 40 MG tablet      TAKE 1 TABLET (40 MG TOTAL) BY MOUTH DAILY.   30 tablet   2     CYCLE FILL MEDICATION. Authorization is required f ...   . levofloxacin (LEVAQUIN) 500 MG tablet   Oral   Take 500 mg by mouth daily.         Marland Kitchen losartan (COZAAR) 100 MG tablet   Oral   Take 100 mg by mouth daily. For HTN         . Melatonin 5 MG TABS   Oral   Take 5 mg by mouth at bedtime.         . metoprolol (LOPRESSOR) 50 MG tablet   Oral  Take 1 tablet (50 mg total) by mouth 2 (two) times daily.   180 tablet   0     **PATIENT NEEDS APPOINTMENT**   . nitroGLYCERIN (NITROSTAT) 0.4 MG SL tablet   Sublingual   Place 1 tablet (0.4 mg total) under the tongue every 5 (five) minutes as needed. For chest  pain   25 tablet   3   . potassium chloride SA (K-DUR,KLOR-CON) 20 MEQ tablet      TAKE 1 TABLET (20 MEQ TOTAL) BY MOUTH DAILY.   90 tablet   0     **PATIENT NEEDS APPOINTMENT**   . predniSONE (DELTASONE) 10 MG tablet      Take  4 each am x 2 days,   2 each am x 2 days,  1 each am x 2 days and stop   100 tablet   0   . ALPRAZolam (XANAX) 0.5 MG tablet   Oral   Take 1 tablet (0.5 mg total) by mouth 3 (three) times daily as needed for sleep or anxiety.   90 tablet   0   . pantoprazole (PROTONIX) 40 MG tablet   Oral   Take 1 tablet (40 mg total) by mouth daily.   30 tablet   3      Review of Systems: Gen:  Denies  fever, sweats, chills, fatigue HEENT: Denies blurred vision, double vision, ear pain, eye pain, hearing loss, nose bleeds, sore throat Cvc:  No dizziness, chest pain or heaviness Resp:   Denies cough or sputum porduction, shortness of breath Gi: Denies swallowing difficulty, stomach pain, nausea or vomiting, diarrhea, constipation, bowel incontinence Gu:  Denies bladder incontinence, burning urine Ext:   No Joint pain, stiffness or swelling Skin: No skin rash, easy bruising or bleeding or hives Endoc:  No polyuria, polydipsia , polyphagia or weight change Psych: No depression, insomnia or hallucinations  Other:  All other systems negative  Allergies:  Review of patient's allergies indicates no known allergies.  Physical Examination:  VS: BP 116/62  Pulse 67  Ht 5' (1.524 m)  Wt 141 lb (63.957 kg)  BMI 27.54 kg/m2  SpO2 91%  General Appearance: No distress  Neuro: EXAM: without focal findings, mental status, speech normal, alert and oriented, cranial nerves 2-12 grossly normal  HEENT: PERRLA, EOM intact, no ptosis, no other lesions noticed Pulmonary: Baseline bilateral basilar dry crackles, decreased breath sounds at the bases, no wheezing no sputum production, mild coarse upper airway sounds, good respiratory effort and air entry Cardiovascular:@  Exam:  Normal S1,S2.  No m/r/g.     Abdomen:Exam: Benign, Soft, non-tender, No masses  Skin:   warm, no rashes, no ecchymosis  Extremities: normal, no cyanosis, clubbing, no edema, warm with normal capillary refill.   Labs results:  BMP Lab Results  Component Value Date   NA 137 08/18/2014   K 4.4 08/18/2014   CL 103 08/18/2014   CO2 29 08/18/2014   GLUCOSE 93 08/18/2014   BUN 22 08/18/2014   CREATININE 1.2 08/18/2014     CBC CBC Latest Ref Rng 08/18/2014 05/04/2014 05/03/2014  WBC 4.0 - 10.5 K/uL 11.9(H) 10.4 11.0(H)  Hemoglobin 12.0 - 15.0 g/dL 13.4 13.1 13.5  Hematocrit 36.0 - 46.0 % 40.8 41.0 41.9  Platelets 150.0 - 400.0 K/uL 219.0 257 248     Rad results:      Assessment and Plan: No problem-specific assessment & plan notes found for this encounter.   Updated Medication List Outpatient Encounter Prescriptions as of  08/21/2014  Medication Sig  . ALPRAZolam (XANAX) 0.25 MG tablet Take 0.25 mg by mouth 3 (three) times daily.  Marland Kitchen ascorbic acid (VITAMIN C) 500 MG tablet Take 1,000 mg by mouth daily.   Marland Kitchen aspirin 81 MG tablet Take 325 mg by mouth daily.  . Cholecalciferol (VITAMIN D-3) 1000 UNITS CAPS Take 1 capsule by mouth daily.  . diphenhydrAMINE (BENADRYL) 25 MG tablet Take 25 mg by mouth at bedtime as needed for sleep.  . famotidine (PEPCID) 20 MG tablet Take 20 mg by mouth daily.  . furosemide (LASIX) 40 MG tablet TAKE 1 TABLET (40 MG TOTAL) BY MOUTH DAILY.  Marland Kitchen levofloxacin (LEVAQUIN) 500 MG tablet Take 500 mg by mouth daily.  Marland Kitchen losartan (COZAAR) 100 MG tablet Take 100 mg by mouth daily. For HTN  . Melatonin 5 MG TABS Take 5 mg by mouth at bedtime.  . metoprolol (LOPRESSOR) 50 MG tablet Take 1 tablet (50 mg total) by mouth 2 (two) times daily.  . nitroGLYCERIN (NITROSTAT) 0.4 MG SL tablet Place 1 tablet (0.4 mg total) under the tongue every 5 (five) minutes as needed. For chest pain  . potassium chloride SA (K-DUR,KLOR-CON) 20 MEQ tablet TAKE 1 TABLET (20 MEQ TOTAL) BY MOUTH  DAILY.  Marland Kitchen predniSONE (DELTASONE) 10 MG tablet Take  4 each am x 2 days,   2 each am x 2 days,  1 each am x 2 days and stop    Orders for this visit: No orders of the defined types were placed in this encounter.    Thank  you for the visitation and for allowing  [facility] to assist in the care of your patient. Our recommendations are noted above.  Please contact us if we can be of further service.  Vilinda Boehringer, MD [facility] Office Number: [phone]  Addendum  CT Chest  07/2914 FINDINGS: Atherosclerotic vascular changes noted of the thoracic aorta. No aneurysm. Cardiomegaly. Coronary artery disease. Mild pericardial thickening.  Extensive mediastinal adenopathy is noted. No evidence of progression. Thoracic esophagus is nondistended.  Large airways are patent. Extensive bilateral pulmonary interstitial changes are noted consistent with pulmonary interstitial fibrosis. Similar findings noted on prior exam. No focal new alveolar infiltrate noted. No pleural effusion. No pneumothorax.  Visualized upper abdominal structures are unremarkable.  Thyroid region unremarkable. No significant axillary adenopathy. Degenerative changes thoracic spine. No acute bony abnormality.  IMPRESSION: 1. Diffuse severe chronic interstitial prominence most likely related chronic interstitial lung disease. Similar findings noted on prior exam. No acute cardiopulmonary disease identified. 2. Stable diffuse mediastinal adenopathy. 3. Cardiomegaly. Coronary artery disease.

## 2014-08-21 NOTE — Assessment & Plan Note (Signed)
Agree with Dr. Sherene Sires that increase RR or [place] volum dilutes out fixed flow 02 will inc xanax for now to 0.5 mg tid, and attempt to wean back to 0.25mg  TID in one month follow up.

## 2014-08-23 ENCOUNTER — Telehealth: Payer: Self-pay | Admitting: Internal Medicine

## 2014-08-23 NOTE — Progress Notes (Signed)
Quick Note:  D/w dtr ______

## 2014-08-23 NOTE — Telephone Encounter (Signed)
Spoke with pt's daughter and notified of results of labs per Dr. Sherene SiresWert. She verbalized understanding and denied any questions.

## 2014-09-27 ENCOUNTER — Institutional Professional Consult (permissible substitution): Payer: Medicare HMO | Admitting: Cardiovascular Disease

## 2014-09-27 ENCOUNTER — Ambulatory Visit: Payer: Medicare HMO | Admitting: Internal Medicine

## 2014-10-02 ENCOUNTER — Ambulatory Visit (INDEPENDENT_AMBULATORY_CARE_PROVIDER_SITE_OTHER): Payer: Medicare HMO | Admitting: Internal Medicine

## 2014-10-02 VITALS — BP 100/70 | HR 89 | Ht 60.0 in | Wt 145.0 lb

## 2014-10-02 DIAGNOSIS — F4323 Adjustment disorder with mixed anxiety and depressed mood: Secondary | ICD-10-CM

## 2014-10-02 DIAGNOSIS — J9611 Chronic respiratory failure with hypoxia: Secondary | ICD-10-CM

## 2014-10-02 DIAGNOSIS — J8410 Pulmonary fibrosis, unspecified: Secondary | ICD-10-CM

## 2014-10-02 DIAGNOSIS — F32A Depression, unspecified: Secondary | ICD-10-CM | POA: Insufficient documentation

## 2014-10-02 DIAGNOSIS — F329 Major depressive disorder, single episode, unspecified: Secondary | ICD-10-CM

## 2014-10-02 MED ORDER — PREDNISONE 10 MG PO TABS: 10.0000 mg | ORAL_TABLET | Freq: Every day | ORAL | Status: AC

## 2014-10-02 MED ORDER — ALPRAZOLAM 0.5 MG PO TABS: ORAL_TABLET | ORAL | Status: AC

## 2014-10-02 NOTE — Assessment & Plan Note (Signed)
Chronic respiratory failure secondary to pulmonary fibrosis Continue with steroids, continuous oxygen, PPI, H2 blocker, exercise as tolerated. Anxiety control-worsening anxiety can cause worsening dyspnea, this is a perpetual cycle and appropriate treatment with Xanax 0.5 mg 3 times a day will assist in alleviating her symptoms and triggering dyspnea. Her last visit she was advised to try and wean down to Xanax to 0.25 mg 3 times a day however she is unable to do this at this time, as a matter of fact she is requesting 4 times a day Xanax due to anxiety at the night. Given this level of anxiety and medication request, at this time will give a one month supply for Xanax 0.5 mg 3 times a day (HCPOA is advised to split the last dose of Xanax and half) and have followup with behavioral health for further evaluation of anxiety and management of her antianxiety medications. Currently on prednisone 10 mg daily, will attempt to wean steroids. HCPOA and Patient advised to start 5 mg daily tomorrow morning for one week, if patient able to tolerate this dose then continue with it; however his symptoms of cough, sputum production, dyspnea on exertion increases then they're advised to resume 10 mg prednisone daily. Hopefully steroid reduction along with monitoring input and output and Lasix will reduce the amount of lower extremity edema she has.

## 2014-10-02 NOTE — Assessment & Plan Note (Signed)
Possibly due to chronic lung disease Further evaluation by behavioral health, will also request to help in managing her anxiety medications.

## 2014-10-02 NOTE — Patient Instructions (Signed)
Xanax .5 mg one in morning, one at noon, 1/2 tab evening and 1/2 at bedtime. Tomorrow prednisone 5 mg for 1 week. If no unusual sign or symptoms continue with 5 mg. I breathing worsens then continue with 10 mg. Revisit 1 month Referral to Behavioral health

## 2014-10-02 NOTE — Assessment & Plan Note (Signed)
Possibly due to chronic lung disease and overall complex medical situation. Further evaluation by behavioral health, will also request to help in managing her anxiety medications.

## 2014-10-02 NOTE — Progress Notes (Signed)
MRN# [id number] Lori [name] 15-Nov-1935   CC: Chief Complaint  Patient presents with  . Follow-up    Patient here f/u breaathing she is having lots of sob and fluid retention. Some cough, no wheezing or chest tightness. She has has increase in heart rate      Brief History: Brief patient profile:  [id number] never smoker no problems as child or adult with breathing " until after heart problems " dx with PF in 2009 placed on 02 in 2012 by Dr Verl Blalock and first seen in pulmonary clinic 09/14/12, [place].  HPI  09/14/2012 1st pulmonary office eval/ Wert with ILD dating back to 2009 and sob since then never better than slow walking, maybe 50 ft, better since placed on 02 by Dr Verl Blalock around one year prior to [place] - better also on xopenex hfa but rarely uses it.  rec  Only use your albuterol as a rescue medication to be used if you can't catch your breath  Please remember to go to the lab > esr 48  10/13/2012 f/u ov/Wert cc no change doe on 3lpm, no change chronic orthopnea.  rec  Try pantoprazole Take 30-60 min before first meal of the day and Pepcid 20 mg one bedtime until return  GERD diet  11/29/2012 f/u ov/Wert cc no change doe on 3lpm 24 h per day.  Stay on pantoprazole 40 mg one daily before bfast and pepcid 20 mg one bedtime  Stay on 3lpm 24 h per day  07/05/13 started prednisone 10 mg 2 each am  07/12/2013 f/u ov/Wert/ prednisone 20 mg / 02 3lpm, Continue prednisone dosed 2 each am with bfast, once breathing better reduce to one  08/09/2013 f/u ov/Wert re: pf/adenopathy/ 3lpm at rest and 4lpm walking, reduced pred to 10 mg per day 7 day prior to OV because Breathing better  rec Continue prednisone at 10 mg daily - ok to increase to 20 mg per day if worse - take with breakfast in am  09/21/2013 f/u ov/Wert re: ? Sarcoid/chronic resp failure definitely improves sob on the 20 mg per day vs 10 and has more energy, less malaise, but generally maintains 10 mg per day dosing.  rec   Continue prednisone at 10 mg daily - ok to increase to 20 mg per day if worse - take with breakfast in am  Stop prinivil and double the diovan to 320 mg one daily  04/28/2014-05/02/2014 Hospitalization: acute on chronic diastolic HF failure, CAP, RUL consolidation, was in rehab for 20 days.  07/05/2014 f\u with Dr. Stevenson Clinch, [place])- states doing well, back to 4LPM of O2, still getting physical therapy with ALF, daugther in law present. Has stopped steroids since last visit with Dr. Melvyn Novas, but could not remember when.  rec  No change rx   08/18/2014 acute extended complex ov/Wert re: sob/ chronic respiratory failure, restarted on PPI and H2 blocker, restarted on prednisone 20 mg daily x2 days, then 10 mg daily if improved. Xanax 0.5 mg 3 times a day for anxiety driven respiratory distress.  08/21/14 follow up acute pulmonary visit from 9/25 - improvement in overall symptoms, back to baseline dyspnea, Plan to continue with PPI, H2 blocker, steroids, anxiety control, to treat dyspnea related to pulmonary fibrosis. Continue at 4 LPM  10/02/14 - currently on 10 mg of prednisone daily, but her last visit she was continued on 0.5 mg 3 times a day and Xanax. Today she states along with her healthcare power of attorney that she is  doing fairly well except for significant bilateral leg swelling which started over the weekend. Patient states she had some fast food, but no increase of fluids over the weekend. HCPOA stated that Lasix is now at 60 mg daily, and her physician will be visiting her on Thursday, 10/05/2014. They also stated that patient wants to be a full DO NOT RESUSCITATE, with no aggressive life-prolonging or life-saving interventions.  PMHX:   Past Medical History  Diagnosis Date  . RBBB   . HYPERTENSION   . DIASTOLIC HEART FAILURE, CHRONIC     a. 05/2008 Echo: EF 55-60%, Diast Dysfxn, mild-mod AI, triv MR;  b. 07/2012 Echo: EF 60-65%, Gr 1 DD, Mid AI.  Marland Kitchen AORTIC INSUFFICIENCY      a. mild-mod by echo 05/2008;  b. mild by echo 07/2012  . OSTEOPOROSIS   . DEPRESSION   . Chronic lung disease     a. on home O2;  b. 07/2012 CTA chest: chronic sev lung dzs, similar to 2009 CT, stable R paratracheal nodes.  . OBSTRUCTIVE SLEEP APNEA     pt denies this hx (08/17/2012) - though previously seen by Dr. Gwenette Greet for this Dx.  . Daily headache   . Arthritis     "knees" (08/17/2012)  . Anxiety   . Depression   . Aortic valve disorders   . Respiratory failure, chronic   . GERD (gastroesophageal reflux disease)    Surgical Hx:  Past Surgical History  Procedure Laterality Date  . Rhinoplasty  1953    "broke it"   Family Hx:  Family History  Problem Relation Age of Onset  . Heart disease Sister   . Cancer Father     died in his 26's  . Other Mother     died @ 58 - "old age"  . Stroke Sister     deceased  . Cancer Brother     deceased  . Heart attack    . Heart attack Sister   . Heart attack Brother   . Hyperlipidemia Sister   . Hypertension Sister    Social Hx:   History  Substance Use Topics  . Smoking status: Never Smoker   . Smokeless tobacco: Never Used     Comment: Secondhand exposure to smoke  . Alcohol Use: 4.2 oz/week    7 Glasses of wine per week   Medication:   Current Outpatient Rx  Name  Route  Sig  Dispense  Refill  . ALPRAZolam (XANAX) 0.5 MG tablet      1 tab qam, 1 tab q noon, 1/2 tab evening, 1/2 tab qhs   90 tablet   0   . ascorbic acid (VITAMIN C) 500 MG tablet   Oral   Take 1,000 mg by mouth daily.          Marland Kitchen aspirin 81 MG tablet   Oral   Take 325 mg by mouth daily.         . Cholecalciferol (VITAMIN D-3) 1000 UNITS CAPS   Oral   Take 1 capsule by mouth daily.         . diphenhydrAMINE (BENADRYL) 25 MG tablet   Oral   Take 25 mg by mouth at bedtime as needed for sleep.         . famotidine (PEPCID) 20 MG tablet   Oral   Take 20 mg by mouth daily.         . furosemide (LASIX) 40 MG tablet      TAKE  1  TABLET (40 MG TOTAL) BY MOUTH DAILY. Patient taking differently: Take 60 mg by mouth daily. TAKE 1 TABLET (40 MG TOTAL) BY MOUTH DAILY.   30 tablet   2     CYCLE FILL MEDICATION. Authorization is required f ...   . losartan (COZAAR) 100 MG tablet   Oral   Take 100 mg by mouth daily. For HTN         . Melatonin 5 MG TABS   Oral   Take 5 mg by mouth at bedtime.         . metoprolol (LOPRESSOR) 50 MG tablet   Oral   Take 1 tablet (50 mg total) by mouth 2 (two) times daily.   180 tablet   0     **PATIENT NEEDS APPOINTMENT**   . nitroGLYCERIN (NITROSTAT) 0.4 MG SL tablet   Sublingual   Place 1 tablet (0.4 mg total) under the tongue every 5 (five) minutes as needed. For chest pain   25 tablet   3   . pantoprazole (PROTONIX) 40 MG tablet   Oral   Take 1 tablet (40 mg total) by mouth daily.   30 tablet   3   . potassium chloride SA (K-DUR,KLOR-CON) 20 MEQ tablet      TAKE 1 TABLET (20 MEQ TOTAL) BY MOUTH DAILY.   90 tablet   0     **PATIENT NEEDS APPOINTMENT**   . predniSONE (DELTASONE) 10 MG tablet   Oral   Take 1 tablet (10 mg total) by mouth daily.   30 tablet   1   . ALPRAZolam (XANAX) 0.25 MG tablet   Oral   Take 0.25 mg by mouth 3 (three) times daily.         Marland Kitchen ALPRAZolam (XANAX) 1 MG tablet   Oral   Take 1 mg by mouth daily.            Review of Systems: Gen:  Denies  fever, sweats, chills HEENT: Denies blurred vision, double vision, ear pain, eye pain, hearing loss, nose bleeds, sore throat Cvc:  No dizziness, chest pain or heaviness Resp:   Chronic SOB and use of O2 Gi: Denies swallowing difficulty, stomach pain, nausea or vomiting, diarrhea, constipation, bowel incontinence Gu:  Denies bladder incontinence, burning urine Ext:   Swelling in legs Skin: No skin rash, easy bruising or bleeding or hives Endoc:  No polyuria, polydipsia , polyphagia or weight change Psych: No depression, insomnia or hallucinations  Other:  All other systems  negative  Allergies:  Review of patient's allergies indicates no known allergies.  Physical Examination:  VS: BP 100/70 mmHg  Pulse 89  Ht 5' (1.524 m)  Wt 145 lb (65.772 kg)  BMI 28.32 kg/m2  SpO2 87%  General Appearance: No distress  HEENT: PERRLA, EOM intact, no ptosis, no other lesions noticed Pulmonary:Exam: good inspiratory and expiratory effort, fine crackles at the bilateral bases (baseline), no wheezing, no sputum production.  Cardiovascular:@ Exam:  Normal S1,S2.  No m/r/g.     Abdomen:Exam: Benign, Soft, non-tender, No masses  Skin:   warm, no rashes, no ecchymosis  Extremities: +2 pitting edema to knee's bilaterally, no cyanosis.   Labs results:  BMP Lab Results  Component Value Date   NA 137 08/18/2014   K 4.4 08/18/2014   CL 103 08/18/2014   CO2 29 08/18/2014   GLUCOSE 93 08/18/2014   BUN 22 08/18/2014   CREATININE 1.2 08/18/2014     CBC CBC Latest Ref  Rng 08/18/2014 05/04/2014 05/03/2014  WBC 4.0 - 10.5 K/uL 11.9(H) 10.4 11.0(H)  Hemoglobin 12.0 - 15.0 g/dL 13.4 13.1 13.5  Hematocrit 36.0 - 46.0 % 40.8 41.0 41.9  Platelets 150.0 - 400.0 K/uL 219.0 257 248    Assessment and Plan: Depression Possibly due to chronic lung disease Further evaluation by behavioral health, will also request to help in managing her anxiety medications.  Adjustment disorder with mixed anxiety and depressed mood Possibly due to chronic lung disease and overall complex medical situation. Further evaluation by behavioral health, will also request to help in managing her anxiety medications.  Chronic respiratory failure Chronic respiratory failure secondary to pulmonary fibrosis Continue with steroids, continuous oxygen, PPI, H2 blocker, exercise as tolerated. Anxiety control-worsening anxiety can cause worsening dyspnea, this is a perpetual cycle and appropriate treatment with Xanax 0.5 mg 3 times a day will assist in alleviating her symptoms and triggering dyspnea. Her last visit  she was advised to try and wean down to Xanax to 0.25 mg 3 times a day however she is unable to do this at this time, as a matter of fact she is requesting 4 times a day Xanax due to anxiety at the night. Given this level of anxiety and medication request, at this time will give a one month supply for Xanax 0.5 mg 3 times a day (HCPOA is advised to split the last dose of Xanax and half) and have followup with behavioral health for further evaluation of anxiety and management of her antianxiety medications. Currently on prednisone 10 mg daily, will attempt to wean steroids. HCPOA and Patient advised to start 5 mg daily tomorrow morning for one week, if patient able to tolerate this dose then continue with it; however his symptoms of cough, sputum production, dyspnea on exertion increases then they're advised to resume 10 mg prednisone daily. Hopefully steroid reduction along with monitoring input and output and Lasix will reduce the amount of lower extremity edema she has.   Pulmonary fibrosis The goal with a chronic steroid dependent illness is always arriving at the lowest effective dose that controls the disease/symptoms and not accepting a set "formula" which is based on statistics or guidelines that don't always take into account patient  variability or the natural hx of the dz in every individual patient, which may well vary over time.   The patient completed 20 mg of prednisone for the past 2 days, states that she's back to baseline breathing now, at her last visit she was weaned down to 10 mg daily, however she is now having significant bilateral lower extremity edema, and she is advised to further wean down steroids to 5 mg daily for one week and continue if there are no symptoms. Patient and caretaker is advised that if her breathing becomes worse she may go back to 0 mg of prednisone.   Also, Use of PPI is associated with improved survival time and with decreased radiologic fibrosis per King's  study published in [facility] vol 184 p1390.  Dec 2011 In addition further studies have shown that control of GERD with an H2 blocker does have some benefit in treating pulmonary fibrosis (Rokhsara S., et al. Lenna Sciara. Of Thoracic Disease, vol 5, pg 48-73, Feb 2013) This may not be cause and effect, but given how universally unhelpful all the otherstudy drugs have been for pf,   rec resume  rx ppi / diet/ lifestyle modification.    10/02/14 Pulmonary Fibrosis Treatment Plan - Prednisone 5  mg daily, may  increase to 10 mg if having worsening symptoms - Protonix 40 mg daily, Pepcid 20 mg daily - Anxiety control-Xanax 0.5 mg 3 times a day x30 days (divide last dose into 0.25 mg), follow up with behavioral health for further evaluation of anxiety and management of anti-anxiety medications.  - Oxygen , 4 LPM continuously - patient and family has agreed on FULL DO NOT RESUSCITATE status.  They are advised to also inform PMD and place DNR order in her medical record.      Updated Medication List Outpatient Encounter Prescriptions as of 10/02/2014  Medication Sig  . ALPRAZolam (XANAX) 0.5 MG tablet 1 tab qam, 1 tab q noon, 1/2 tab evening, 1/2 tab qhs  . ascorbic acid (VITAMIN C) 500 MG tablet Take 1,000 mg by mouth daily.   Marland Kitchen aspirin 81 MG tablet Take 325 mg by mouth daily.  . Cholecalciferol (VITAMIN D-3) 1000 UNITS CAPS Take 1 capsule by mouth daily.  . diphenhydrAMINE (BENADRYL) 25 MG tablet Take 25 mg by mouth at bedtime as needed for sleep.  . famotidine (PEPCID) 20 MG tablet Take 20 mg by mouth daily.  . furosemide (LASIX) 40 MG tablet TAKE 1 TABLET (40 MG TOTAL) BY MOUTH DAILY. (Patient taking differently: Take 60 mg by mouth daily. TAKE 1 TABLET (40 MG TOTAL) BY MOUTH DAILY.)  . losartan (COZAAR) 100 MG tablet Take 100 mg by mouth daily. For HTN  . Melatonin 5 MG TABS Take 5 mg by mouth at bedtime.  . metoprolol (LOPRESSOR) 50 MG tablet Take 1 tablet (50 mg total) by mouth 2 (two) times daily.  .  nitroGLYCERIN (NITROSTAT) 0.4 MG SL tablet Place 1 tablet (0.4 mg total) under the tongue every 5 (five) minutes as needed. For chest pain  . pantoprazole (PROTONIX) 40 MG tablet Take 1 tablet (40 mg total) by mouth daily.  . potassium chloride SA (K-DUR,KLOR-CON) 20 MEQ tablet TAKE 1 TABLET (20 MEQ TOTAL) BY MOUTH DAILY.  Marland Kitchen predniSONE (DELTASONE) 10 MG tablet Take 1 tablet (10 mg total) by mouth daily.  . [DISCONTINUED] ALPRAZolam (XANAX) 0.5 MG tablet Take 1 tablet (0.5 mg total) by mouth 3 (three) times daily as needed for sleep or anxiety.  . [DISCONTINUED] predniSONE (DELTASONE) 10 MG tablet Take  4 each am x 2 days,   2 each am x 2 days,  1 each am x 2 days and stop (Patient taking differently: Take 10 mg by mouth daily with breakfast. )  . ALPRAZolam (XANAX) 0.25 MG tablet Take 0.25 mg by mouth 3 (three) times daily.  Marland Kitchen ALPRAZolam (XANAX) 1 MG tablet Take 1 mg by mouth daily.  . [DISCONTINUED] levofloxacin (LEVAQUIN) 500 MG tablet Take 500 mg by mouth daily.    Orders for this visit: Orders Placed This Encounter  Procedures  . Ambulatory referral to Behavioral Health    Referral Priority:  Routine    Referral Type:  Psychiatric    Referral Reason:  Specialty Services Required    Requested Specialty:  Behavioral Health    Number of Visits Requested:  1    Thank  you for the visitation and for allowing  [facility] to assist in the care of your patient. Our recommendations are noted above.  Please contact us if we can be of further service.  Vilinda Boehringer, MD [facility] Office Number: [phone]

## 2014-10-02 NOTE — Assessment & Plan Note (Signed)
The goal with a chronic steroid dependent illness is always arriving at the lowest effective dose that controls the disease/symptoms and not accepting a set "formula" which is based on statistics or guidelines that don't always take into account patient  variability or the natural hx of the dz in every individual patient, which may well vary over time.   The patient completed 20 mg of prednisone for the past 2 days, states that she's back to baseline breathing now, at her last visit she was weaned down to 10 mg daily, however she is now having significant bilateral lower extremity edema, and she is advised to further wean down steroids to 5 mg daily for one week and continue if there are no symptoms. Patient and caretaker is advised that if her breathing becomes worse she may go back to 0 mg of prednisone.   Also, Use of PPI is associated with improved survival time and with decreased radiologic fibrosis per King's study published in [facility] vol 184 p1390.  Dec 2011 In addition further studies have shown that control of GERD with an H2 blocker does have some benefit in treating pulmonary fibrosis (Rokhsara S., et al. Shela CommonsJ. Of Thoracic Disease, vol 5, pg 48-73, Feb 2013) This may not be cause and effect, but given how universally unhelpful all the otherstudy drugs have been for pf,   rec resume  rx ppi / diet/ lifestyle modification.    10/02/14 Pulmonary Fibrosis Treatment Plan - Prednisone 5  mg daily, may increase to 10 mg if having worsening symptoms - Protonix 40 mg daily, Pepcid 20 mg daily - Anxiety control-Xanax 0.5 mg 3 times a day x30 days (divide last dose into 0.25 mg), follow up with behavioral health for further evaluation of anxiety and management of anti-anxiety medications.  - Oxygen , 4 LPM continuously - patient and family has agreed on FULL DO NOT RESUSCITATE status.  They are advised to also inform PMD and place DNR order in her medical record.

## 2014-10-18 ENCOUNTER — Other Ambulatory Visit: Payer: Self-pay

## 2014-10-18 ENCOUNTER — Encounter: Payer: Self-pay | Admitting: Internal Medicine

## 2014-10-18 ENCOUNTER — Institutional Professional Consult (permissible substitution): Payer: Medicare HMO | Admitting: Cardiovascular Disease

## 2014-10-18 DIAGNOSIS — F4323 Adjustment disorder with mixed anxiety and depressed mood: Secondary | ICD-10-CM

## 2014-10-18 DIAGNOSIS — F329 Major depressive disorder, single episode, unspecified: Secondary | ICD-10-CM

## 2014-10-18 DIAGNOSIS — F32A Depression, unspecified: Secondary | ICD-10-CM

## 2014-10-18 NOTE — Telephone Encounter (Signed)
Per last OV, [facility].  I have placed this order.  Message sent back to patient advising order placed. Nothing further needed. Patient Instructions     Xanax .5 mg one in morning, one at noon, 1/2 tab evening and 1/2 at bedtime. Tomorrow prednisone 5 mg for 1 week. If no unusual sign or symptoms continue with 5 mg. I breathing worsens then continue with 10 mg. Revisit 1 month Referral to Behavioral health   Depression - Stephanie AcreVishal Mungal, MD at 10/02/2014 4:43 PM     Status: Written Related Problem: Depression   Expand All Collapse All   Possibly due to chronic lung disease Further evaluation by behavioral health, will also request to help in managing her anxiety medications.            Adjustment disorder with mixed anxiety and depressed mood - Stephanie AcreVishal Mungal, MD at 10/02/2014 4:43 PM     Status: Written Related Problem: Adjustment disorder with mixed anxiety and depressed mood   Expand All Collapse All   Possibly due to chronic lung disease and overall complex medical situation. Further evaluation by behavioral health, will also request to help in managing her anxiety medications.

## 2014-10-24 DEATH — deceased

## 2014-11-01 ENCOUNTER — Ambulatory Visit: Payer: Medicare HMO | Admitting: Internal Medicine

## 2015-02-02 IMAGING — CR DG CHEST 2V
2 series · 2 of 2 positions shown · non-contrast
Comparison: 04/28/2014

CLINICAL DATA: Shortness of breath.

EXAM:
CHEST  2 VIEW

[x chest ap]
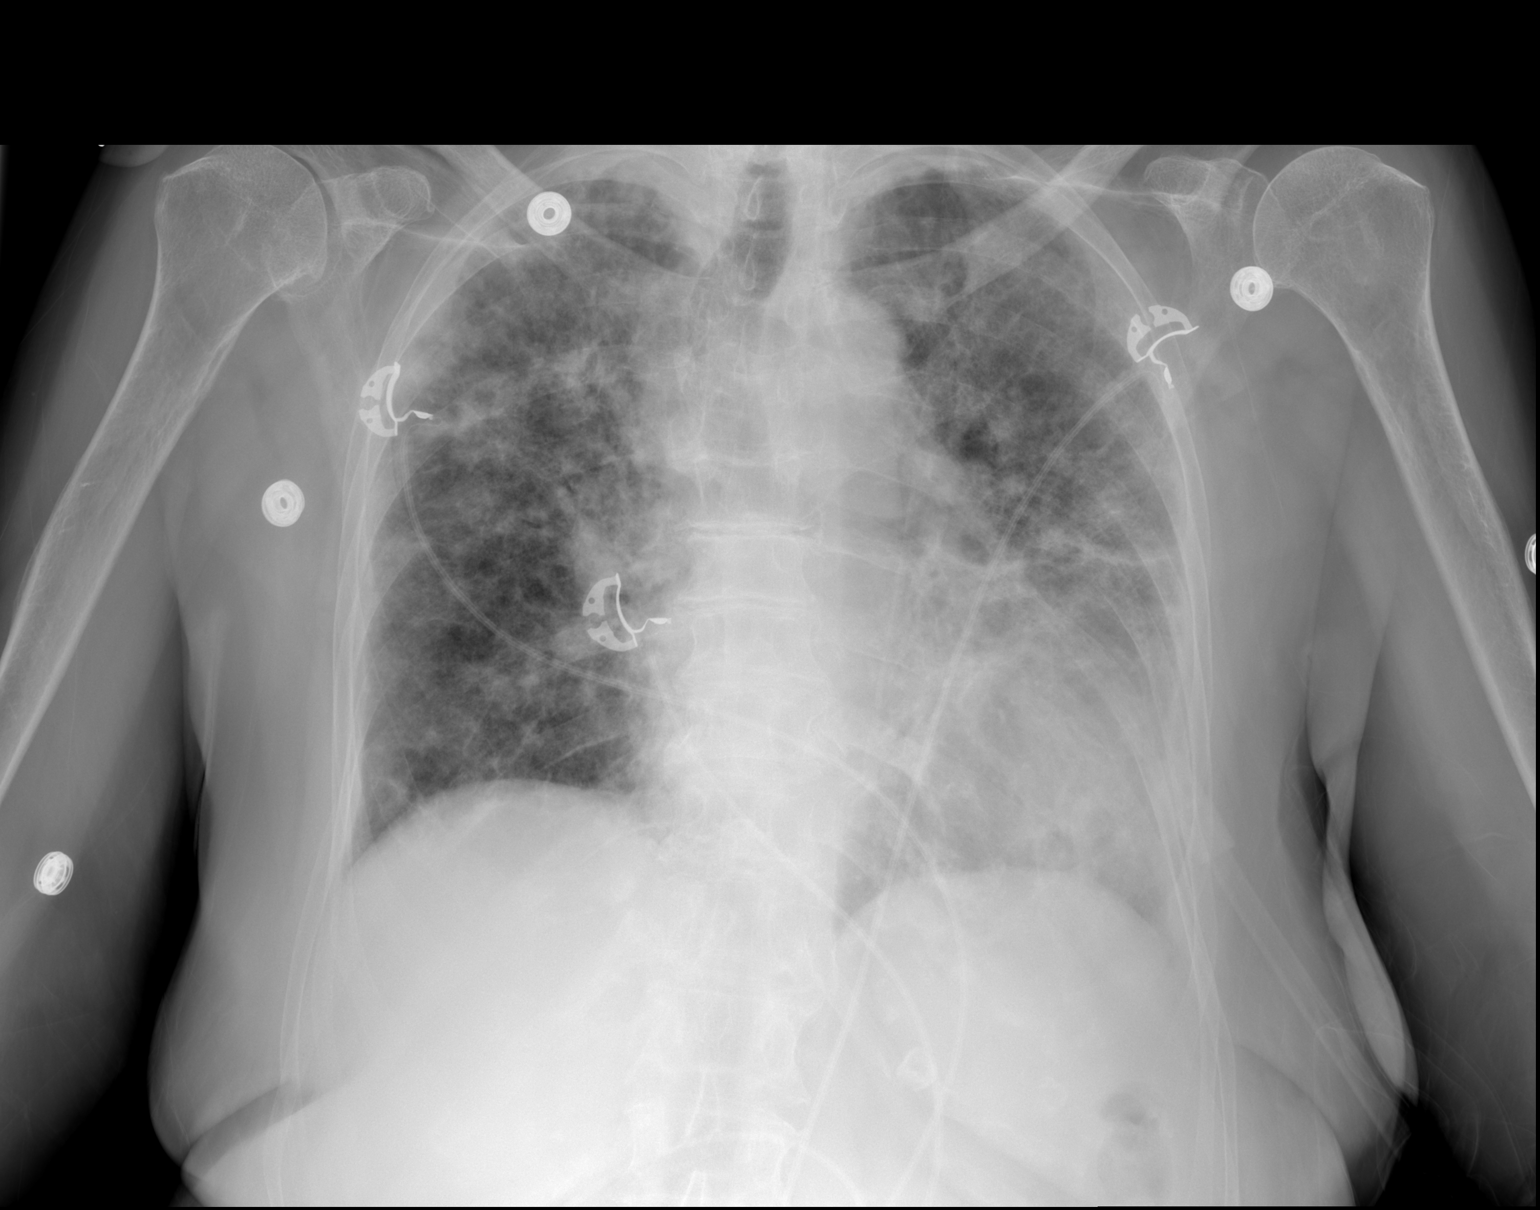

[w chest lat]
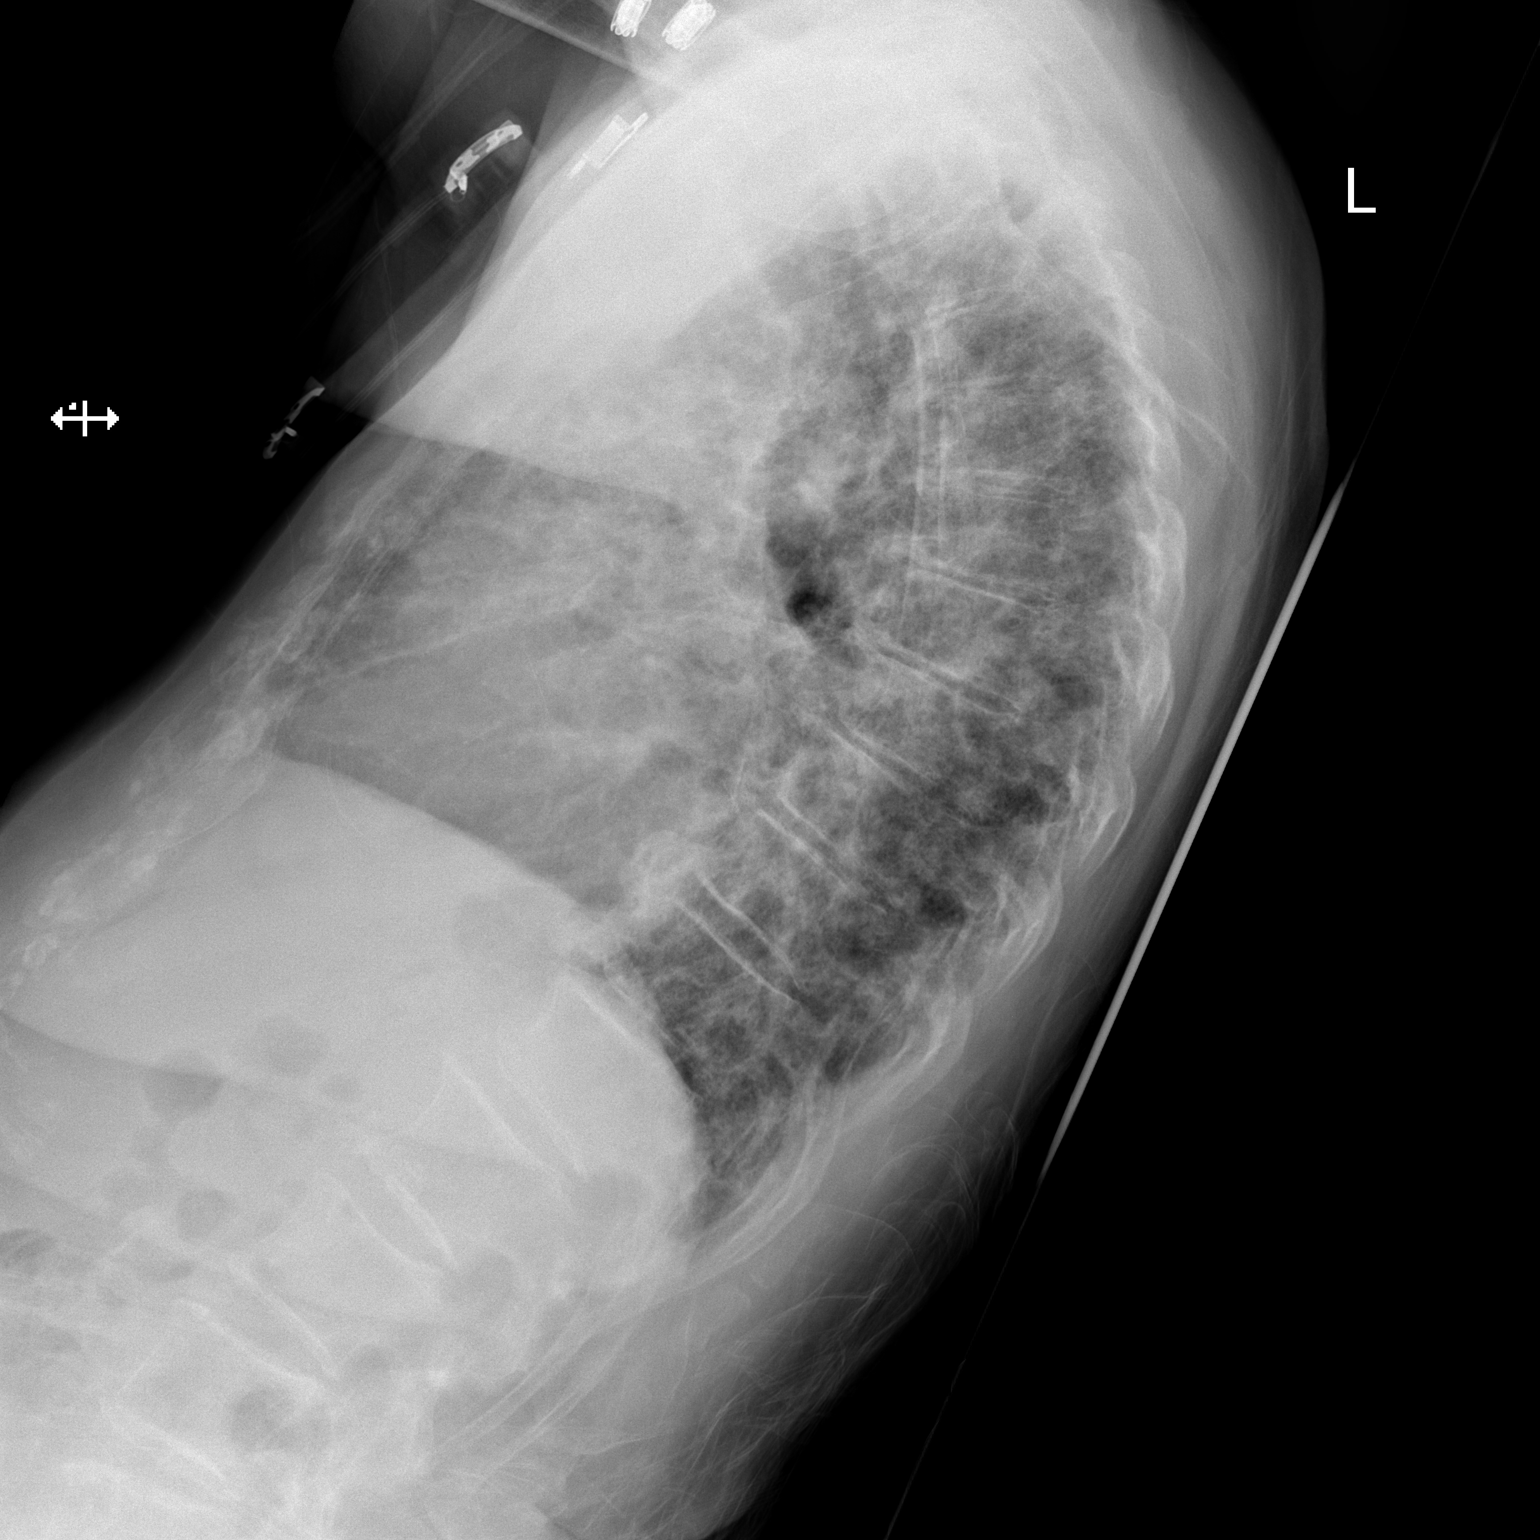

[2 of 2 positions shown; findings below may reference images not displayed]

FINDINGS: The heart is enlarged. There are patchy parenchymal infiltrates
bilaterally consistent with pulmonary fibrosis and unchanged.
IMPRESSION: No significant change in the appearance of pulmonary opacities.

## 2015-04-29 IMAGING — CT CT CHEST W/O CM
2 of 5 series · 15 of 36 positions shown, 18 images · non-contrast
Comparison: Chest x-ray 05/01/2014. Chest CT 04/28/2014. Chest CT
08/18/2012.

CLINICAL DATA: Shortness of breath.

EXAM:
CT CHEST WITHOUT CONTRAST
TECHNIQUE: Multidetector CT imaging of the chest was performed following the
standard protocol without IV contrast..

[Series 2: routine chest wo · axial · 0.64mm/px · z∈[-267,-52]mm · 12 of 49 slices shown, 15 images]
[im 3/49  mediastinal]
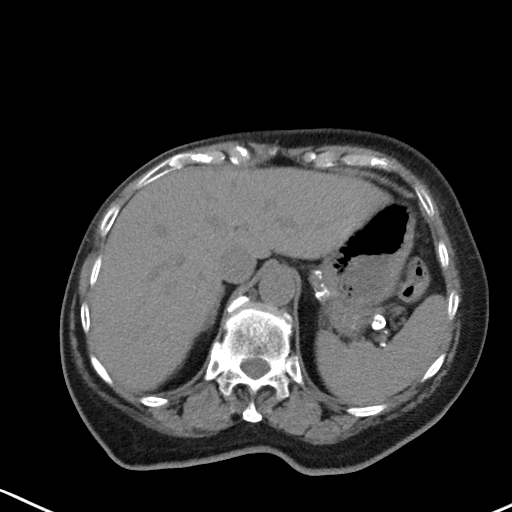
[im 3/49  lung]
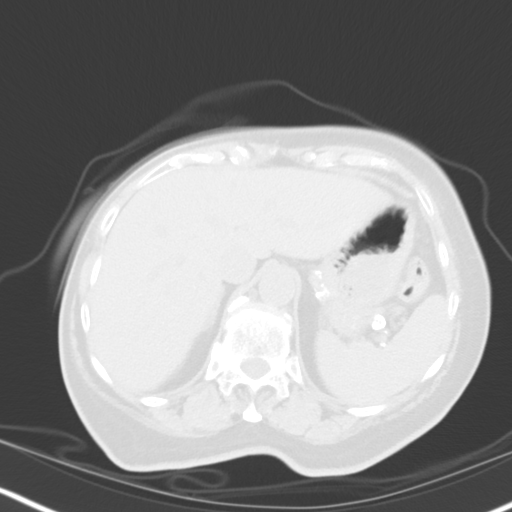
[im 9/49  lung]
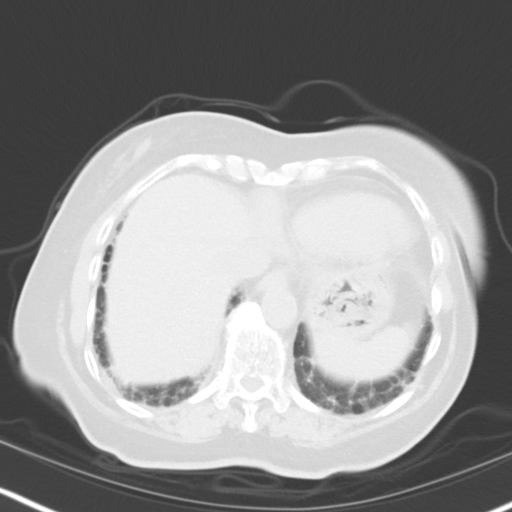
[im 12/49  lung]
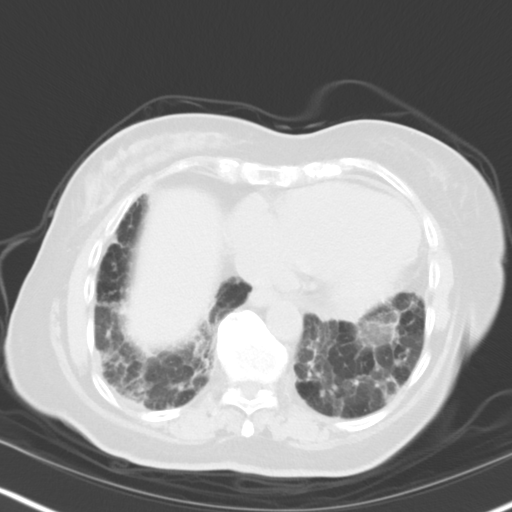
[im 15/49  lung]
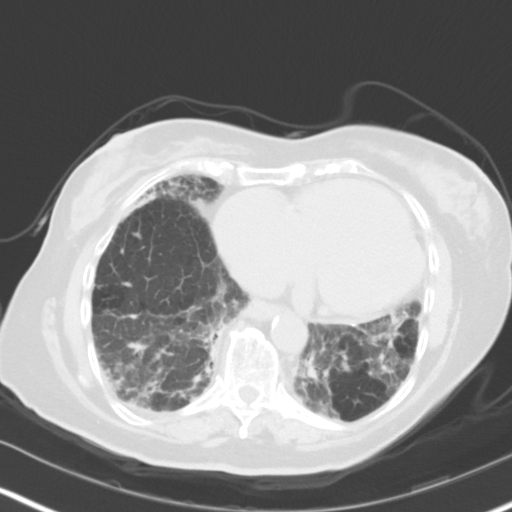
[im 20/49  mediastinal]
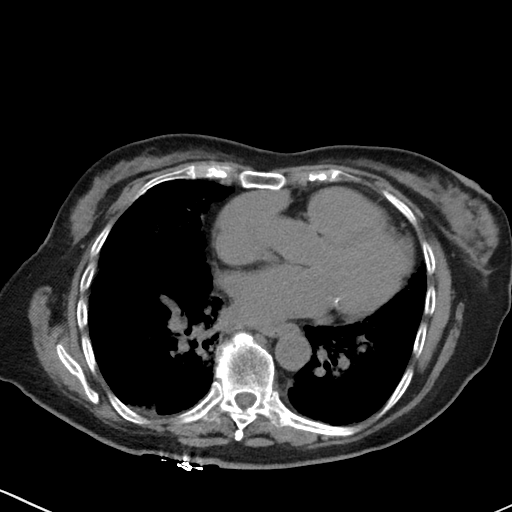
[im 20/49  lung]
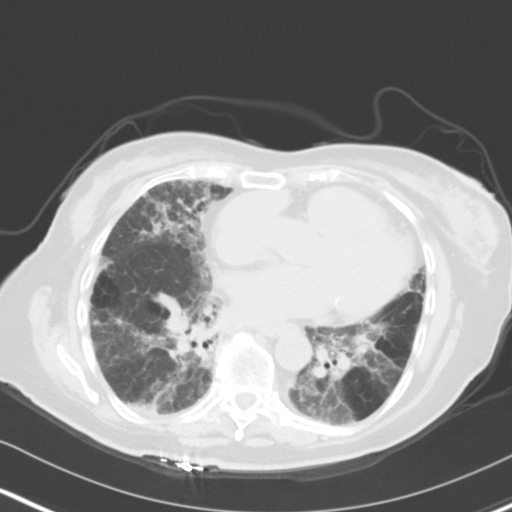
[im 23/49  lung]
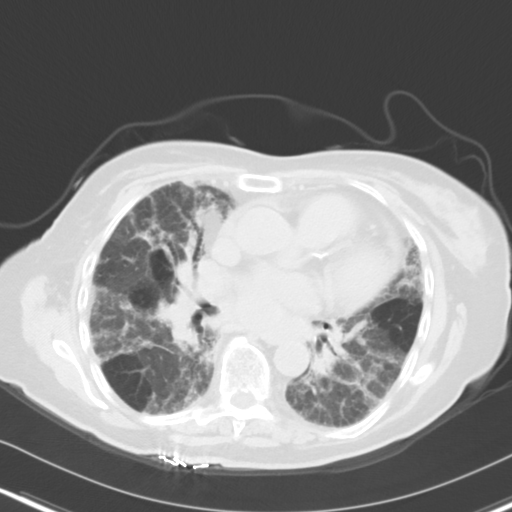
[im 26/49  lung]
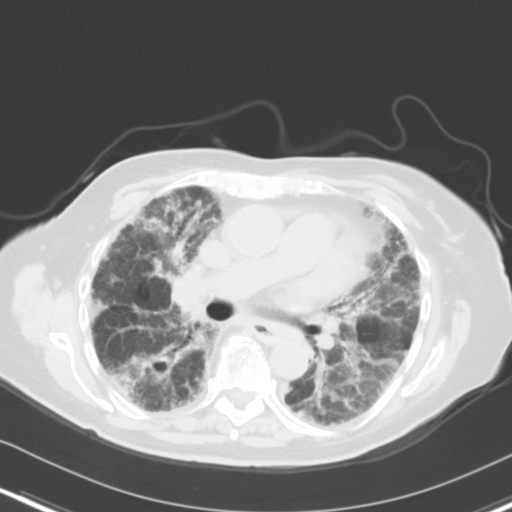
[im 32/49  lung]
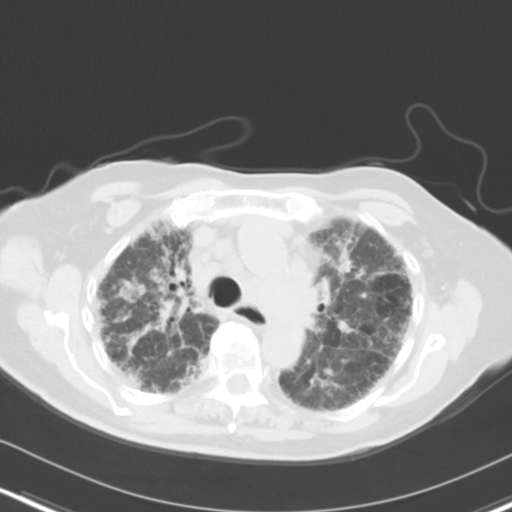
[im 34/49  mediastinal]
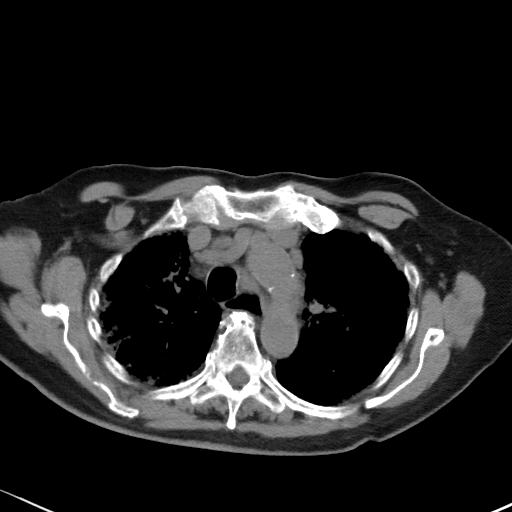
[im 34/49  lung]
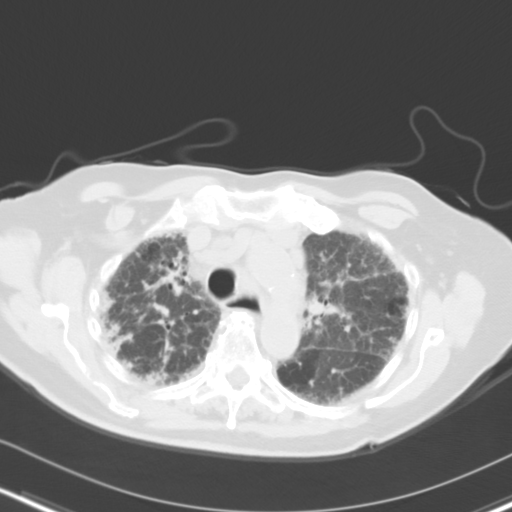
[im 37/49  lung]
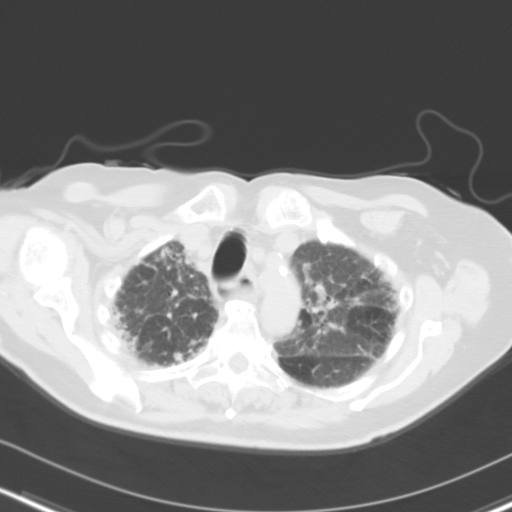
[im 43/49  lung]
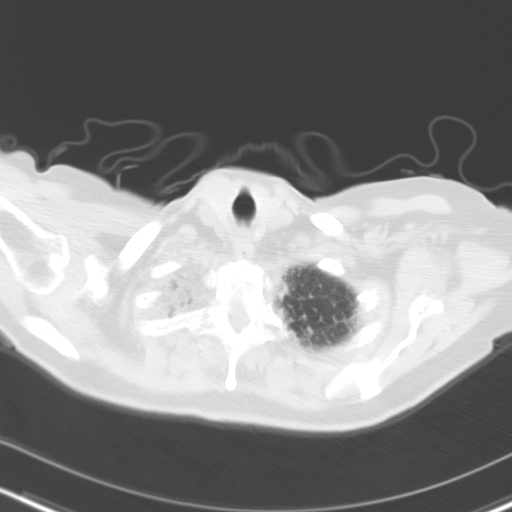
[im 46/49  lung]
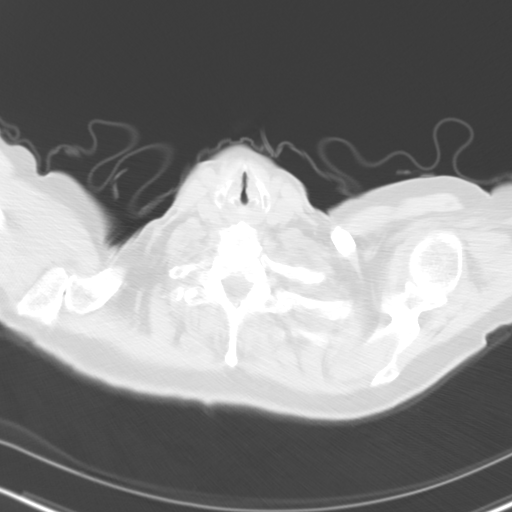

[Series 8: cor routine chest wo · coronal · 0.56mm/px · 3 of 101 slices shown]
[im 21/101  lung]
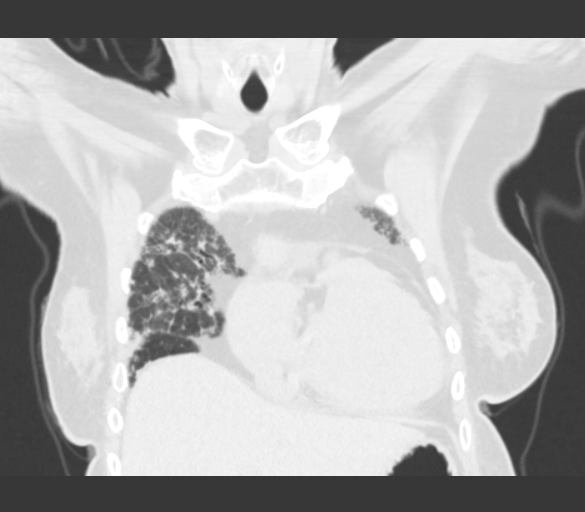
[im 41/101  lung]
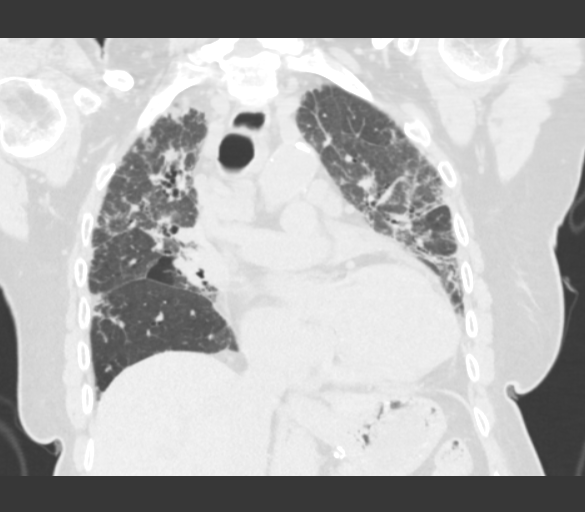
[im 61/101  lung]
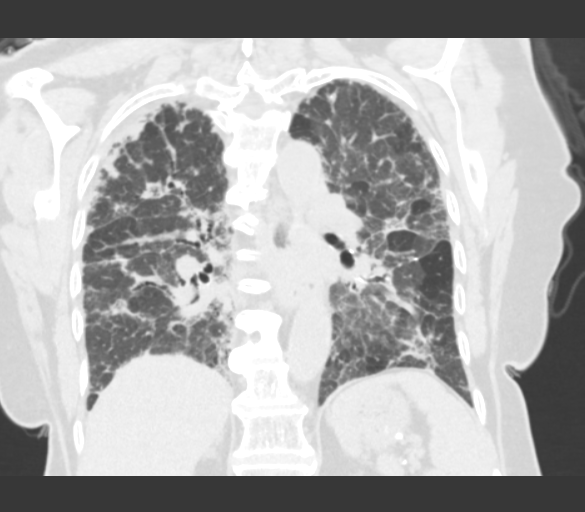

[15 of 36 positions shown; findings below may reference images not displayed]

FINDINGS: Atherosclerotic vascular changes noted of the thoracic aorta. No
aneurysm. Cardiomegaly. Coronary artery disease. Mild pericardial
thickening.

Extensive mediastinal adenopathy is noted. No evidence of
progression. Thoracic esophagus is nondistended.

Large airways are patent. Extensive bilateral pulmonary interstitial
changes are noted consistent with pulmonary interstitial fibrosis.
Similar findings noted on prior exam. No focal new alveolar
infiltrate noted. No pleural effusion. No pneumothorax.

Visualized upper abdominal structures are unremarkable.

Thyroid region unremarkable. No significant axillary adenopathy.
Degenerative changes thoracic spine. No acute bony abnormality.
IMPRESSION: 1. Diffuse severe chronic interstitial prominence most likely
related chronic interstitial lung disease. Similar findings noted on
prior exam. No acute cardiopulmonary disease identified.
2. Stable diffuse mediastinal adenopathy.
3. Cardiomegaly.  Coronary artery disease.

## 2015-07-22 IMAGING — CR DG CHEST 1V PORT
1 series · 1 of 1 positions shown · non-contrast
Comparison: CT chest 07/26/2014 and CT chest 07/26/2014.

CLINICAL DATA: Hypoxia in a patient with history of pulmonary
fibrosis.

EXAM:
PORTABLE CHEST - 1 VIEW

[ap]
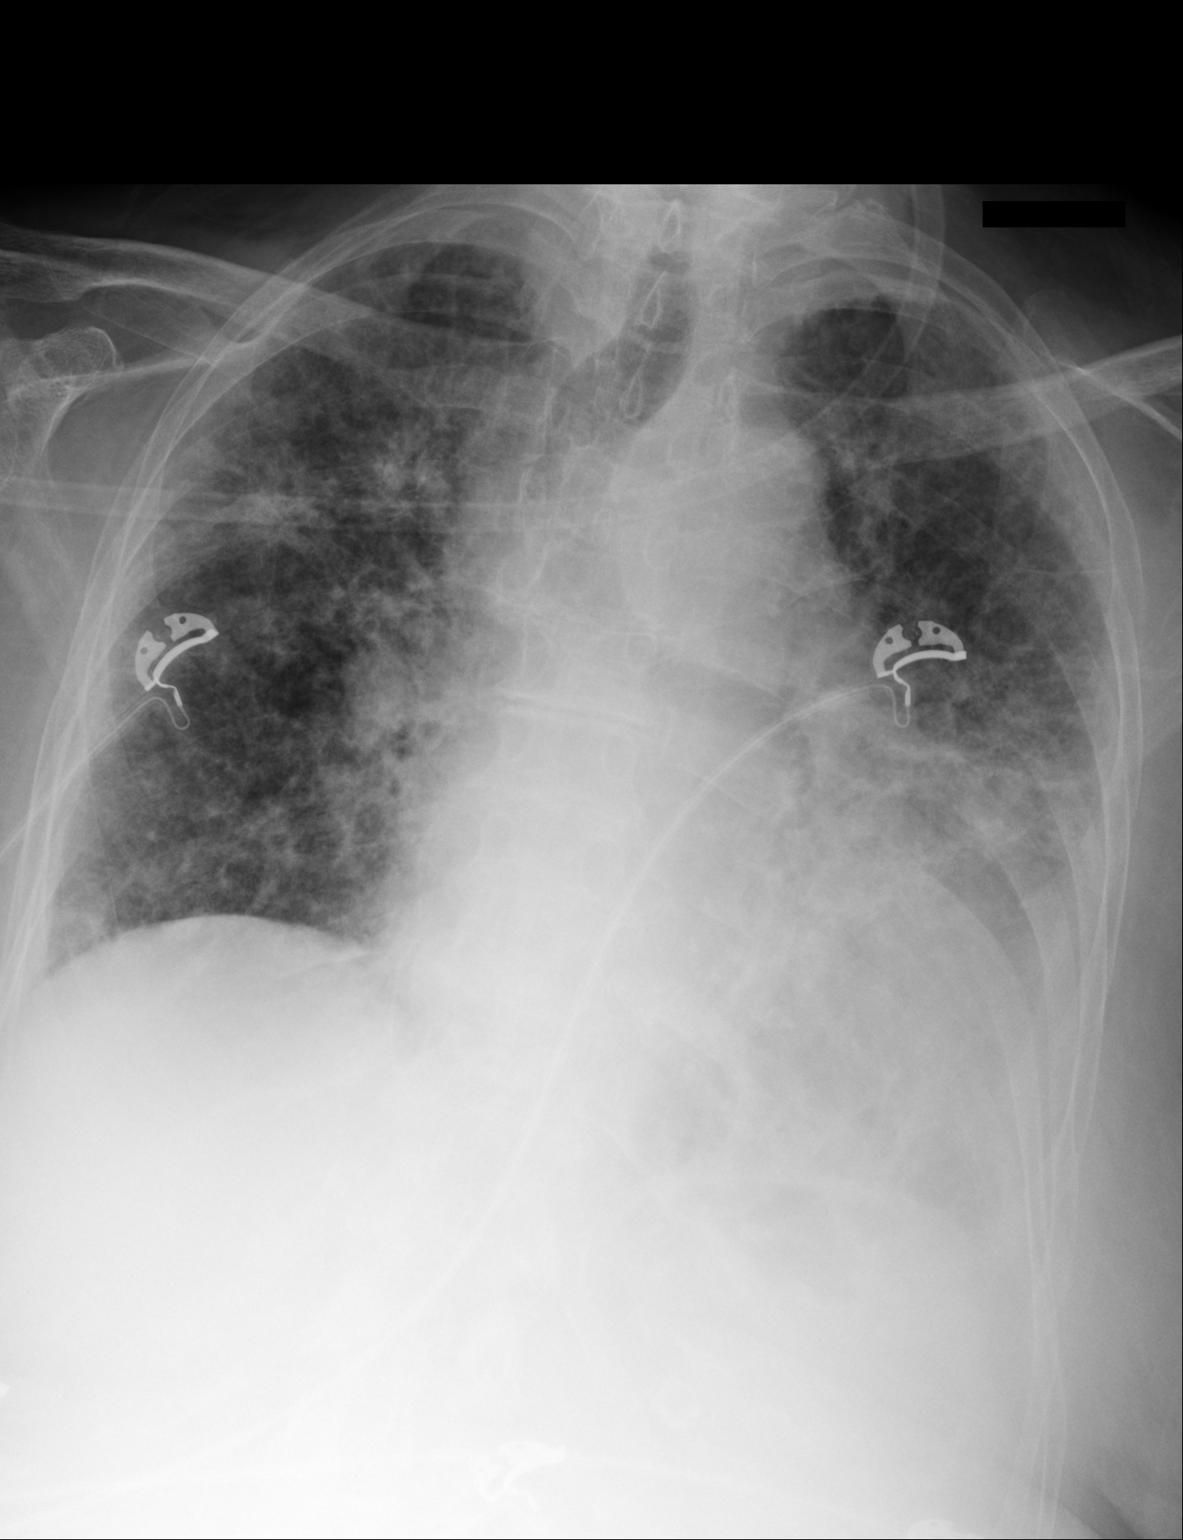

[1 of 1 positions shown; findings below may reference images not displayed]

FINDINGS: Coarse bilateral pulmonary opacities which appear somewhat worse on
the left are unchanged. There is no new airspace disease or pleural
effusion. No pneumothorax identified. Cardiomegaly is noted.
IMPRESSION: Pulmonary fibrosis and cardiomegaly without acute process.
